# Patient Record
Sex: Female | Born: 1980 | Hispanic: No | Marital: Single | Smoking: Former smoker
Health system: Southern US, Community
[De-identification: ages and names within clinical notes are randomized; demographics above are authoritative.]

## PROBLEM LIST (undated history)

## (undated) ENCOUNTER — Inpatient Hospital Stay (HOSPITAL_COMMUNITY): Payer: Self-pay

## (undated) DIAGNOSIS — K6289 Other specified diseases of anus and rectum: Secondary | ICD-10-CM

## (undated) DIAGNOSIS — E785 Hyperlipidemia, unspecified: Secondary | ICD-10-CM

## (undated) DIAGNOSIS — O24419 Gestational diabetes mellitus in pregnancy, unspecified control: Secondary | ICD-10-CM

## (undated) DIAGNOSIS — R51 Headache: Secondary | ICD-10-CM

## (undated) DIAGNOSIS — R519 Headache, unspecified: Secondary | ICD-10-CM

## (undated) DIAGNOSIS — E282 Polycystic ovarian syndrome: Secondary | ICD-10-CM

## (undated) DIAGNOSIS — R059 Cough, unspecified: Secondary | ICD-10-CM

## (undated) DIAGNOSIS — R05 Cough: Secondary | ICD-10-CM

## (undated) DIAGNOSIS — I10 Essential (primary) hypertension: Secondary | ICD-10-CM

## (undated) DIAGNOSIS — I219 Acute myocardial infarction, unspecified: Secondary | ICD-10-CM

## (undated) DIAGNOSIS — F419 Anxiety disorder, unspecified: Secondary | ICD-10-CM

## (undated) DIAGNOSIS — G930 Cerebral cysts: Secondary | ICD-10-CM

## (undated) DIAGNOSIS — R0981 Nasal congestion: Secondary | ICD-10-CM

## (undated) HISTORY — DX: Other specified diseases of anus and rectum: K62.89

## (undated) HISTORY — DX: Essential (primary) hypertension: I10

## (undated) HISTORY — DX: Cough, unspecified: R05.9

## (undated) HISTORY — DX: Headache, unspecified: R51.9

## (undated) HISTORY — DX: Anxiety disorder, unspecified: F41.9

## (undated) HISTORY — DX: Headache: R51

## (undated) HISTORY — DX: Gestational diabetes mellitus in pregnancy, unspecified control: O24.419

## (undated) HISTORY — DX: Hyperlipidemia, unspecified: E78.5

## (undated) HISTORY — PX: HEMORRHOID SURGERY: SHX153

## (undated) HISTORY — DX: Cerebral cysts: G93.0

## (undated) HISTORY — DX: Cough: R05

## (undated) HISTORY — DX: Polycystic ovarian syndrome: E28.2

## (undated) HISTORY — DX: Nasal congestion: R09.81

---
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
## 2001-07-03 ENCOUNTER — Emergency Department (HOSPITAL_COMMUNITY): Admission: EM | Admit: 2001-07-03 | Discharge: 2001-07-04 | Payer: Self-pay | Admitting: *Deleted

## 2002-10-02 ENCOUNTER — Emergency Department (HOSPITAL_COMMUNITY): Admission: EM | Admit: 2002-10-02 | Discharge: 2002-10-02 | Payer: Self-pay | Admitting: *Deleted

## 2002-12-31 ENCOUNTER — Ambulatory Visit (HOSPITAL_COMMUNITY): Admission: RE | Admit: 2002-12-31 | Discharge: 2002-12-31 | Payer: Self-pay | Admitting: Obstetrics and Gynecology

## 2003-01-05 ENCOUNTER — Emergency Department (HOSPITAL_COMMUNITY): Admission: EM | Admit: 2003-01-05 | Discharge: 2003-01-05 | Payer: Self-pay | Admitting: Emergency Medicine

## 2003-02-10 ENCOUNTER — Ambulatory Visit (HOSPITAL_COMMUNITY): Admission: RE | Admit: 2003-02-10 | Discharge: 2003-02-10 | Payer: Self-pay | Admitting: Obstetrics and Gynecology

## 2003-02-17 ENCOUNTER — Ambulatory Visit (HOSPITAL_COMMUNITY): Admission: AD | Admit: 2003-02-17 | Discharge: 2003-02-17 | Payer: Self-pay | Admitting: Obstetrics and Gynecology

## 2003-03-02 ENCOUNTER — Inpatient Hospital Stay (HOSPITAL_COMMUNITY): Admission: RE | Admit: 2003-03-02 | Discharge: 2003-03-04 | Payer: Self-pay | Admitting: Obstetrics and Gynecology

## 2003-04-15 ENCOUNTER — Other Ambulatory Visit: Admission: RE | Admit: 2003-04-15 | Discharge: 2003-04-15 | Payer: Self-pay | Admitting: Obstetrics & Gynecology

## 2003-09-25 ENCOUNTER — Emergency Department (HOSPITAL_COMMUNITY): Admission: EM | Admit: 2003-09-25 | Discharge: 2003-09-25 | Payer: Self-pay | Admitting: Emergency Medicine

## 2003-10-08 ENCOUNTER — Emergency Department (HOSPITAL_COMMUNITY): Admission: EM | Admit: 2003-10-08 | Discharge: 2003-10-08 | Payer: Self-pay | Admitting: Emergency Medicine

## 2003-11-08 HISTORY — PX: FRACTURE SURGERY: SHX138

## 2004-05-07 HISTORY — PX: ANKLE SURGERY: SHX546

## 2005-03-02 ENCOUNTER — Emergency Department (HOSPITAL_COMMUNITY): Admission: EM | Admit: 2005-03-02 | Discharge: 2005-03-02 | Payer: Self-pay | Admitting: Emergency Medicine

## 2008-08-11 ENCOUNTER — Emergency Department (HOSPITAL_COMMUNITY): Admission: EM | Admit: 2008-08-11 | Discharge: 2008-08-11 | Payer: Self-pay | Admitting: Emergency Medicine

## 2008-09-02 ENCOUNTER — Other Ambulatory Visit: Admission: RE | Admit: 2008-09-02 | Discharge: 2008-09-02 | Payer: Self-pay | Admitting: Obstetrics and Gynecology

## 2009-03-27 ENCOUNTER — Inpatient Hospital Stay (HOSPITAL_COMMUNITY): Admission: AD | Admit: 2009-03-27 | Discharge: 2009-03-29 | Payer: Self-pay | Admitting: Obstetrics & Gynecology

## 2009-07-16 ENCOUNTER — Other Ambulatory Visit: Admission: RE | Admit: 2009-07-16 | Discharge: 2009-07-16 | Payer: Self-pay | Admitting: Obstetrics and Gynecology

## 2009-07-22 ENCOUNTER — Emergency Department (HOSPITAL_COMMUNITY): Admission: EM | Admit: 2009-07-22 | Discharge: 2009-07-22 | Payer: Self-pay | Admitting: Emergency Medicine

## 2010-09-07 HISTORY — PX: INCISION AND DRAINAGE BREAST ABSCESS: SUR672

## 2010-09-08 ENCOUNTER — Inpatient Hospital Stay (HOSPITAL_COMMUNITY): Admission: AD | Admit: 2010-09-08 | Discharge: 2010-09-08 | Payer: Self-pay | Admitting: Obstetrics & Gynecology

## 2010-09-15 ENCOUNTER — Emergency Department (HOSPITAL_BASED_OUTPATIENT_CLINIC_OR_DEPARTMENT_OTHER): Admission: EM | Admit: 2010-09-15 | Discharge: 2010-09-15 | Payer: Self-pay | Admitting: Emergency Medicine

## 2011-02-11 LAB — URINALYSIS, ROUTINE W REFLEX MICROSCOPIC
Hgb urine dipstick: NEGATIVE
Ketones, ur: NEGATIVE mg/dL
Specific Gravity, Urine: 1.02 (ref 1.005–1.030)
pH: 6.5 (ref 5.0–8.0)

## 2011-02-11 LAB — PREGNANCY, URINE: Preg Test, Ur: NEGATIVE

## 2011-02-15 LAB — CBC
MCHC: 33.3 g/dL (ref 30.0–36.0)
MCV: 83 fL (ref 78.0–100.0)
RDW: 14.5 % (ref 11.5–15.5)
WBC: 17.6 10*3/uL — ABNORMAL HIGH (ref 4.0–10.5)

## 2011-02-15 LAB — RPR: RPR Ser Ql: NONREACTIVE

## 2011-03-02 ENCOUNTER — Other Ambulatory Visit: Payer: Self-pay | Admitting: Adult Health

## 2011-03-02 ENCOUNTER — Other Ambulatory Visit (HOSPITAL_COMMUNITY)
Admission: RE | Admit: 2011-03-02 | Discharge: 2011-03-02 | Disposition: A | Discharge status: Home / Self Care | Payer: Medicaid Other | Source: Ambulatory Visit | Attending: Obstetrics and Gynecology | Admitting: Obstetrics and Gynecology

## 2011-03-02 DIAGNOSIS — Z01419 Encounter for gynecological examination (general) (routine) without abnormal findings: Secondary | ICD-10-CM | POA: Insufficient documentation

## 2011-03-25 NOTE — H&P (Signed)
NAME:  Kaitlin Torres, PARA                            ACCOUNT NO.:  [id number]   MEDICAL RECORD NO.:  [id number]                   PATIENT TYPE:  INP   LOCATION:  LDR1                                 FACILITY:  [facility]   PHYSICIAN:  Tilda Burrow, M.D.              DATE OF BIRTH:  [birth year]   DATE OF ADMISSION:  03/02/2003  DATE OF DISCHARGE:                                HISTORY & PHYSICAL   ADMISSION DIAGNOSES:  1. Pregnancy, 40 weeks, three days' gestation.  2. Cervical favor ability.  3. Medical induction of labor, elective.   HISTORY OF PRESENT ILLNESS:  This 30 year old female, gravida 2, para 1, AB  0, LMP May 22, 2002, placing menstruation EDC of February 27, 2003.  Ultrasound found EDC of April 29 and Mar 08, 2003, is admitted at 40 weeks,  three days by menstrual history, 39 weeks, three days by ultrasound  criteria.  Her pregnancy was followed through our office since August 09, 2002, with the patient admitted for labor induction.  Pregnancy course has  been followed through our office with blood type A positive, EDS negative,  rubella immune at the present, hepatitis, HIV, GC, Chlamydia, RPR all  negative.  Alpha theta protein within normal limits at 1 and 6 and  20.  Risk of Down's syndrome and group B Strep negative.  She has a history of an  abnormal Pap in the past without treatment and apparent spontaneous  resolution.  She is aware that all these risks of labor and management can  occur with induced labors, including the need for emergency intervention by  cesarean section or operative management balloonary.  Mechanical aspects of  labor induction have been reviewed with the patient to her satisfaction by  myself and Cec Neal, R. N.   PAST MEDICAL HISTORY:  Benign.   PAST SURGICAL HISTORY:  Negative.   ALLERGIES:  NO KNOWN DRUG ALLERGIES.   SOCIAL HISTORY:  1. Lives with mother and daughter.  2. Goes to [employer]  3. Is the best friend of Cec [place], [place]. N.   HABITS:  1. Cigarettes:  One half to one pack per day.  2. Alcohol and recreational drugs:  Denies at this time.  Denied in recent     years.   PHYSICAL EXAMINATION:  GENERAL:  Reveals a healthy-appearing female, alert,  oriented x3.  HEENT:  Pupils equal, round, and reactive.  Extraocular movements intact.  ABDOMEN:  Shows a term sized fetus, vertex presentation, estimated fetal  weight 7-1/2 pounds.  CERVIX:  Cervix 2 cm long, soft, mid position with balloon easily inserted.  EXTREMITIES:  Show 1+ edema.   IMPRESSION:  Pregnancy at term for induction of labor with cervical favor  ability.   PLAN:  Induction overnight.  Analgesic planned epidural.  She plans to  bottle feed and future contraception is planned to be the patch.  Tilda Burrow, M.D.    JVF/MEDQ  D:  03/02/2003  T:  03/02/2003  Job:  [id number]   cc:   Tilda Burrow, M.D.  [address] [place]  [place] [address]  Fax: [phone]   Francoise Schaumann. Halm, D.O.  [address], [address]  [place]  [place] [address]  Fax: [phone]

## 2011-03-25 NOTE — Op Note (Signed)
   NAME:  Kaitlin Torres, Kaitlin Torres                            ACCOUNT NO.:  [id number]   MEDICAL RECORD NO.:  [id number]                   PATIENT TYPE:  INP   LOCATION:  LDR1                                 FACILITY:  [facility]   PHYSICIAN:  Tilda Burrow, M.D.              DATE OF BIRTH:  12-15-1980   DATE OF PROCEDURE:  03/03/2003  DATE OF DISCHARGE:                                 OPERATIVE REPORT   PROCEDURE:  Lumbar epidural catheter placement, continuous lumbar epidural  catheter placed using loss of resistance technique at L3-4 interspace after  prepping and draping of the back.  Loss of resistance technique with an 19-  gauge Tuohy needle identified the epidural space at a distance of  approximately 5 cm depth.  5 cc of Xylocaine 1.5% with epinephrine was  administered.  No tachycardia noted and the epidural catheter inserted to a  depth of 3 cm.  The patient received analgesic effect.  The epidural  catheter was taped to the back and the patient given a 7 cc bolus of 0.125%  Marcaine solution (with 2 mcg per mL of Fentanyl).  The patient then had a  12 cc infusion performed with good analgesic levels and __________.                                               Tilda Burrow, M.D.    JVF/MEDQ  D:  03/03/2003  T:  03/03/2003  Job:  [id number]

## 2011-03-25 NOTE — Op Note (Signed)
   NAME:  ANAYLA, GIANNETTI                            ACCOUNT NO.:  [id number]   MEDICAL RECORD NO.:  [id number]                   PATIENT TYPE:  INP   LOCATION:  A428                                 FACILITY:  [facility]   PHYSICIAN:  Jacklyn Shell, C.N.M.    DATE OF BIRTH:  1981-04-06   DATE OF PROCEDURE:  DATE OF DISCHARGE:                                 OPERATIVE REPORT   DELIVERY NOTE:  Tyyonna was noted to be fully dilated with an irresistible urge  to push at approximately 1215.  After a brief second phase, she delivered a  viable female infant at 58.  Mouth and nose were suctioned on the perineum  and the baby delivered easily after that.  Weight is 6 pounds 12 ounces,  Apgars 9/9.  Twenty units of Pitocin diluted in 1000 ml of lactated Ringer's  was then infused rapidly IV.  The placenta separated spontaneously and  delivered via controlled cord traction and maternal pushing effort at 1231.  It was inspected and appears to be intact with a three-vessel cord.  The  vagina was then inspected and no lacerations were found.  The epidural  catheter was removed with the blue tip being visualized as intact.  Estimated blood loss 300 ml.                                               Jacklyn Shell, C.N.M.    FC/MEDQ  D:  03/03/2003  T:  03/03/2003  Job:  [id number]   cc:   [facility]

## 2011-08-04 ENCOUNTER — Emergency Department (HOSPITAL_COMMUNITY)
Admission: EM | Admit: 2011-08-04 | Discharge: 2011-08-04 | Disposition: A | Discharge status: Home / Self Care | Payer: Medicaid Other | Attending: Emergency Medicine | Admitting: Emergency Medicine

## 2011-08-04 ENCOUNTER — Emergency Department (HOSPITAL_COMMUNITY)
Admission: EM | Admit: 2011-08-04 | Discharge: 2011-08-04 | Disposition: A | Discharge status: Home / Self Care | Payer: Medicaid Other | Source: Home / Self Care | Attending: Emergency Medicine | Admitting: Emergency Medicine

## 2011-08-04 DIAGNOSIS — F411 Generalized anxiety disorder: Secondary | ICD-10-CM | POA: Insufficient documentation

## 2011-08-04 DIAGNOSIS — K6289 Other specified diseases of anus and rectum: Secondary | ICD-10-CM | POA: Insufficient documentation

## 2011-08-04 DIAGNOSIS — K644 Residual hemorrhoidal skin tags: Secondary | ICD-10-CM | POA: Insufficient documentation

## 2011-08-05 ENCOUNTER — Encounter (INDEPENDENT_AMBULATORY_CARE_PROVIDER_SITE_OTHER): Payer: Self-pay | Admitting: Surgery

## 2011-08-05 ENCOUNTER — Ambulatory Visit (INDEPENDENT_AMBULATORY_CARE_PROVIDER_SITE_OTHER): Payer: Medicaid Other | Admitting: Surgery

## 2011-08-05 VITALS — BP 136/70 | HR 100 | Temp 97.8°F | Resp 20 | Ht 68.5 in | Wt 220.0 lb

## 2011-08-05 DIAGNOSIS — K648 Other hemorrhoids: Secondary | ICD-10-CM

## 2011-08-05 NOTE — Progress Notes (Signed)
Chief Complaint  Patient presents with  . Rectal Problems    thrombosed external hems referral from ER - Ms. Paulene Floor, FNP    HISTORY: Patient is a 30 year old female referred from the emergency department and from her primary care physician with a hemorrhoids and pain. Patient was apparently seen in the emergency department on 2 occasions this week. She was given narcotics and topical analgesics. She was placed on stool softener. She was referred to surgery for evaluation.  Patient denies any previous problems with hemorrhoids. She has had no prior anorectal surgery.   Past Medical History  Diagnosis Date  . Nasal congestion   . Cough   . Rectal pain   . Generalized headaches   . Hyperlipidemia      Current Outpatient Prescriptions  Medication Sig Dispense Refill  . DIBUCAINE EX Take by mouth 3 (three) times daily.        . hydrocortisone (ANUSOL-HC) 25 MG suppository Place 25 mg rectally 2 (two) times daily.        Marland Kitchen oxyCODONE-acetaminophen (PERCOCET) 5-325 MG per tablet Take 1 tablet by mouth every 6 (six) hours as needed.        Marland Kitchen POLYETHYLENE GLYCOL 3350 PO Take 238 mg by mouth.           No Known Allergies   Family History  Problem Relation Age of Onset  . Heart disease Father     Cardiac arrest 2006     History   Social History  . Marital Status: Legally Separated    Spouse Name: N/A    Number of Children: N/A  . Years of Education: N/A   Social History Main Topics  . Smoking status: Current Everyday Smoker -- 0.7 packs/day  . Smokeless tobacco: Never Used  . Alcohol Use: Yes  . Drug Use: No  . Sexually Active:    Other Topics Concern  . None   Social History Narrative  . None     REVIEW OF SYSTEMS - PERTINENT POSITIVES ONLY: Patient notes pain and swelling at the anus. She denies bleeding.  She denies prior surgery.   EXAM: Filed Vitals:   08/05/11 1603  BP: 136/70  Pulse: 100  Temp: 97.8 F (36.6 C)  Resp: 20     HEENT: normocephalic; pupils equal and reactive; sclerae clear; dentition good; mucous membranes moist CHEST: clear to auscultation bilaterally without rales, rhonchi, or wheezes CARDIAC: regular rate and rhythm without significant murmur; peripheral pulses are full EXT:  non-tender without edema; no deformity ANORECTAL: Prolapsed internal hemorrhoids, nearly circumferential, no ulceration, no bleeding, no evidence of thrombosis. With lubrication hemorrhoids are completely reduced. No palpable masses. NEURO: no gross focal deficits; no sign of tremor   LABORATORY RESULTS: See E-Chart for most recent results   RADIOLOGY RESULTS: See E-Chart or I-Site for most recent results   IMPRESSION: Grade 4 internal hemorrhoids with prolapse   PLAN: Usual instructions for her hemorrhoids are provided to the patient both orally and in writing. She will continue with stool softeners. She will do to help soaks.  I have asked her to return in 3 weeks for evaluation by my partner who specializes in the [facility] procedure. He will evaluate her again at that time.  We will keep her out of work for the next 2 days to facilitate her recovery.   Velora Heckler, MD, FACS General & Endocrine Surgery [facility]      Visit Diagnoses: 1. Hemorrhoids, internal, with prolapse  Primary Care Physician: No primary provider on file.

## 2011-08-05 NOTE — Patient Instructions (Signed)
ANORECTAL PROCEDURES: 1.  Tub soaks 2-3 times daily in warm water (may add Epsom salts if desired) 2.  Stool softener for one month (store brand Miralax or Colace) 3.  Avoid toilet paper - use baby wipes or Tucks pads 4.  Increase water intake - 6-8 glasses daily 5.  Apply dry pad to area until drainage stops 

## 2011-08-08 LAB — URINALYSIS, ROUTINE W REFLEX MICROSCOPIC
Bilirubin Urine: NEGATIVE
Glucose, UA: NEGATIVE
Specific Gravity, Urine: 1.01
Urobilinogen, UA: 0.2

## 2011-09-05 ENCOUNTER — Encounter (INDEPENDENT_AMBULATORY_CARE_PROVIDER_SITE_OTHER): Payer: Medicaid Other | Admitting: Surgery

## 2011-09-14 ENCOUNTER — Encounter (INDEPENDENT_AMBULATORY_CARE_PROVIDER_SITE_OTHER): Payer: Self-pay | Admitting: Surgery

## 2011-10-11 ENCOUNTER — Encounter (INDEPENDENT_AMBULATORY_CARE_PROVIDER_SITE_OTHER): Payer: Medicaid Other | Admitting: Surgery

## 2011-10-21 ENCOUNTER — Encounter (INDEPENDENT_AMBULATORY_CARE_PROVIDER_SITE_OTHER): Payer: Self-pay | Admitting: Surgery

## 2013-11-07 HISTORY — PX: HEMORRHOID SURGERY: SHX153

## 2017-11-14 ENCOUNTER — Other Ambulatory Visit: Payer: Self-pay

## 2017-11-14 ENCOUNTER — Encounter (INDEPENDENT_AMBULATORY_CARE_PROVIDER_SITE_OTHER): Payer: Self-pay

## 2017-11-14 ENCOUNTER — Ambulatory Visit (INDEPENDENT_AMBULATORY_CARE_PROVIDER_SITE_OTHER): Payer: Medicaid Other | Admitting: Women's Health

## 2017-11-14 ENCOUNTER — Encounter: Payer: Self-pay | Admitting: Women's Health

## 2017-11-14 VITALS — BP 138/88 | HR 82 | Ht 69.0 in | Wt 211.0 lb

## 2017-11-14 DIAGNOSIS — N926 Irregular menstruation, unspecified: Secondary | ICD-10-CM | POA: Diagnosis not present

## 2017-11-14 DIAGNOSIS — Z319 Encounter for procreative management, unspecified: Secondary | ICD-10-CM

## 2017-11-14 DIAGNOSIS — Z3202 Encounter for pregnancy test, result negative: Secondary | ICD-10-CM | POA: Diagnosis not present

## 2017-11-14 DIAGNOSIS — F17210 Nicotine dependence, cigarettes, uncomplicated: Secondary | ICD-10-CM

## 2017-11-14 LAB — POCT URINE PREGNANCY: Preg Test, Ur: NEGATIVE

## 2017-11-14 NOTE — Progress Notes (Signed)
   GYN VISIT Patient name: Kaitlin Torres MRN [id number]  Date of birth: [birth year] Chief Complaint:   period was 3 weeks late  History of Present Illness:   Kaitlin Torres is a 37 y.o. [id number] African American female being seen today for report of period being 3 weeks later, however it did start on 12/27. She and her 21yo boyfriend have been trying to conceive x 6 months. He has never fathered any children. She has conceived 3 times previously without difficulty. Has not been on birth control in over 8 years. This is the 2nd time her period was late. Does have cramping when she has periods. Just moved back from [place] in Nov. Taking pnv. Does smoke 1/2-1ppd. Has cut down from 2ppd.  She wants boyfriend 'checked out'    Patient's last menstrual period was 11/02/2017. The current method of family planning is none. Last pap 03/02/11- here. Reports she did have pap in [place] within last 55yrs Results were:  normal Review of Systems:   Pertinent items are noted in HPI Denies fever/chills, dizziness, headaches, visual disturbances, fatigue, shortness of breath, chest pain, abdominal pain, vomiting, abnormal vaginal discharge/itching/odor/irritation, problems with periods, bowel movements, urination, or intercourse unless otherwise stated above.  Pertinent History Reviewed:  Reviewed past medical,surgical, social, obstetrical and family history.  Reviewed problem list, medications and allergies. Physical Assessment:   Vitals:   11/14/17 0855  BP: 138/88  Pulse: 82  Weight: 211 lb (95.7 kg)  Height: 5\' 9"  (1.753 m)  Body mass index is 31.16 kg/m.       Physical Examination:   General appearance: alert, well appearing, and in no distress  Mental status: alert, oriented to person, place, and time  Skin: warm & dry   Cardiovascular: normal heart rate noted  Respiratory: normal respiratory effort, no distress  Abdomen: soft, non-tender   Pelvic: examination not indicated  Extremities: no edema   Results  for orders placed or performed in visit on 11/14/17 (from the past 24 hour(s))  POCT urine pregnancy   Collection Time: 11/14/17  9:07 AM  Result Value Ref Range   Preg Test, Ur Negative Negative    Assessment & Plan:  1) Irregular/late periods  2) Trying to conceive> continue pnv daily, will check progesterone on day 21 (1/17) to see if ovulating. If not, discussed coc's x 3 months, then coming off. Gave printed fertility tips/tricks. Gave number to [facility] for boyfriend to call/schedule semen analysis  3) Smoker> advised cessation.   Meds: No orders of the defined types were placed in this encounter.   Orders Placed This Encounter  Procedures  . Progesterone  . POCT urine pregnancy    Return for 1/17 for labs; pap results from [facility] in [place] (phone [phone]).  Tawnya Crook CNM, [facility] 11/14/2017 9:45 AM

## 2017-11-14 NOTE — Patient Instructions (Addendum)
Come back Jan 17 for labs to check to see if you are ovulating  [facility]>[phone], semen analysis  If you are trying to get pregnant:   Have sex every other day on days 7-24 of your cycle (Day 1 is the 1st day of your period)  [place] before sex  Lay with your hips elevated on pillows for 20-48mins after sex  Do not smoke or drink alcohol  Lose weight if you are overweight  Take a prenatal vitamin with at least 563SLH of folic acid  Decrease stress in your life  For Him:   Wear boxers instead of briefs  Avoid hot baths/jacuzzi  Vit C supplement  Do not smoke or drink alcohol  Lose weight if you are overweight  Keep in mind that it can be normal to take up to a year to become pregnant 80-90% of women will become pregnant within 1 year of trying

## 2017-11-15 ENCOUNTER — Ambulatory Visit (INDEPENDENT_AMBULATORY_CARE_PROVIDER_SITE_OTHER): Payer: Medicaid Other | Admitting: Family Medicine

## 2017-11-15 ENCOUNTER — Encounter: Payer: Self-pay | Admitting: Family Medicine

## 2017-11-15 ENCOUNTER — Encounter: Payer: Self-pay | Admitting: Women's Health

## 2017-11-15 VITALS — BP 142/98 | HR 85 | Temp 97.1°F | Ht 69.0 in | Wt 211.0 lb

## 2017-11-15 DIAGNOSIS — Z13220 Encounter for screening for lipoid disorders: Secondary | ICD-10-CM

## 2017-11-15 DIAGNOSIS — Z124 Encounter for screening for malignant neoplasm of cervix: Secondary | ICD-10-CM | POA: Insufficient documentation

## 2017-11-15 DIAGNOSIS — Z202 Contact with and (suspected) exposure to infections with a predominantly sexual mode of transmission: Secondary | ICD-10-CM | POA: Diagnosis not present

## 2017-11-15 DIAGNOSIS — R030 Elevated blood-pressure reading, without diagnosis of hypertension: Secondary | ICD-10-CM

## 2017-11-15 DIAGNOSIS — G930 Cerebral cysts: Secondary | ICD-10-CM

## 2017-11-15 DIAGNOSIS — G43009 Migraine without aura, not intractable, without status migrainosus: Secondary | ICD-10-CM

## 2017-11-15 DIAGNOSIS — Z131 Encounter for screening for diabetes mellitus: Secondary | ICD-10-CM

## 2017-11-15 LAB — BAYER DCA HB A1C WAIVED: HB A1C: 5.4 % (ref <7.0)

## 2017-11-15 MED ORDER — BUTALBITAL-APAP-CAFFEINE 50-325-40 MG PO TABS: 1.0000 | ORAL_TABLET | Freq: Four times a day (QID) | ORAL | 0 refills | Status: DC | PRN

## 2017-11-15 NOTE — Progress Notes (Signed)
BP (!) 149/101   Pulse 85   Temp (!) 97.1 F (36.2 C) (Oral)   Ht _0  (1.753 m)   Wt 211 lb (95.7 kg)   LMP 11/02/2017   BMI 31.16 kg/m    Subjective:    Patient ID: Kaitlin Torres, female    DOB: May 01, 1981, 37 y.o.   MRN: [id number]  HPI: Kaitlin Torres is a 37 y.o. 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  She has complaints of migraine headaches that she gets recurrently.  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  She gets these recurrent migraine headaches she describes her headaches as bilateral temporal headaches.  She says that the headaches are a 6 out of 10 currently today.  She says that they normally use Fioricet.  She denies getting any aura with it.  Elevated blood pressure reading Patient has had elevated blood pressure readings, she says is a lot when she gets her migraines.  She has had some previously normal blood pressure readings which is gone back and forth.  She denies any chest pain or vision troubles or shortness of breath or wheezing.  Relevant past medical, surgical, family and social history reviewed and updated as indicated. Interim medical history since our last visit reviewed. Allergies and medications reviewed and updated.  Review of Systems  Constitutional: Negative for chills and fever.  Eyes: Negative for visual disturbance.  Respiratory: Negative for chest tightness and shortness of breath.   Cardiovascular: Negative for chest pain and leg swelling.  Musculoskeletal: Negative for back pain and gait problem.  Skin: Negative for rash.  Neurological: Positive for headaches. Negative  for dizziness, weakness, light-headedness and numbness.  Psychiatric/Behavioral: Negative for agitation and behavioral problems.  All other systems reviewed and are negative.   Per HPI unless specifically indicated above  Social History   Socioeconomic History  . Marital status: Married    Spouse name: Not on file  . Number of children: Not on file  . Years of education: Not on file  . Highest education level: Not on file  Social Needs  . Financial resource strain: Not on file  . Food insecurity - worry: Not on file  . Food insecurity - inability: Not on file  . Transportation needs - medical: Not on file  . Transportation needs - non-medical: Not on file  Occupational History  . Not on file  Tobacco Use  . Smoking status: Current Every Day Smoker    Packs/day: 1.00    Years: 26.00    Pack years: 26.00    Types: Cigarettes  . Smokeless tobacco: Never Used  Substance and Sexual Activity  . Alcohol use: Yes    Frequency: Never    Comment: rarely  . Drug use: No  . Sexual activity: Yes    Birth control/protection: None    Comment: married, 3 kids, vaginal, trying for another  Other Topics Concern  . Not on file  Social History Narrative  . Not on file    Past Surgical History:  Procedure Laterality Date  . ANKLE SURGERY  july 2005   screws placed in right ankle   . FRACTURE SURGERY  2005   right ankle  . HEMORRHOID SURGERY    . HEMORRHOID SURGERY  2015  . INCISION AND DRAINAGE BREAST ABSCESS  november 2011   left breast     Family History  Problem Relation Age of Onset  . Hypertension Mother   . Heart disease Father        Cardiac arrest 2006  . Diabetes Father   . Hyperlipidemia Father   . Stroke Father   . Diabetes Paternal Grandmother   . Cancer Maternal Grandmother        cervical  . Aneurysm Brother     Allergies as of 11/15/2017   No Known Allergies     Medication List        Accurate as of 11/15/17 10:42 AM. Always use your most recent med  list.          ALEVE 220 MG Caps Generic drug:  Naproxen Sodium Take by mouth as needed.   butalbital-acetaminophen-caffeine 50-325-40 MG tablet Commonly known as:  FIORICET, ESGIC Take 1 tablet by mouth every 6 (six) hours as needed for headache.   PRENATAL VITAMIN PO Take 1 tablet by mouth daily.          Objective:    BP (!) 149/101   Pulse 85   Temp (!) 97.1 F (36.2 C) (Oral)   Ht _0  (1.753 m)   Wt 211 lb (95.7 kg)   LMP 11/02/2017   BMI 31.16 kg/m   Wt Readings from Last 3 Encounters:  11/15/17 211 lb (95.7 kg)  11/14/17 211 lb (95.7 kg)  08/05/11 220 lb (99.8 kg)    Physical Exam  Constitutional: She is oriented to person, place, and time. She appears well-developed and well-nourished. No distress.  HENT:  Right Ear: External ear normal.  Left Ear: External ear normal.  Nose: Nose normal.  Mouth/Throat: Oropharynx is clear and moist. No oropharyngeal exudate.  Eyes: Conjunctivae are normal.  Neck: Neck supple. No thyromegaly present.  Cardiovascular: Normal rate, regular rhythm, normal heart sounds and intact distal pulses.  No murmur heard. Pulmonary/Chest: Effort normal and breath sounds normal. No respiratory distress. She has no wheezes.  Musculoskeletal: Normal range of motion. She exhibits no edema or tenderness.  Lymphadenopathy:    She has no cervical adenopathy.  Neurological: She is alert and oriented to person, place, and time. She displays normal reflexes. No cranial nerve deficit. She exhibits normal muscle tone. Coordination normal.  Skin: Skin is warm and dry. No rash noted. She is not diaphoretic.  Psychiatric: She has a normal mood and affect. Her behavior is normal.  Nursing note and vitals reviewed.       Assessment & Plan:   Problem List Items Addressed This Visit    None    Visit Diagnoses    Migraine without aura and without status migrainosus, not intractable    -  Primary   Relevant Medications   Naproxen Sodium  (ALEVE) 220 MG CAPS   butalbital-acetaminophen-caffeine (FIORICET, ESGIC) 50-325-40 MG tablet   Other Relevant Orders   Ambulatory referral to Neurology   Elevated blood pressure reading       Likely from migraines, will monitor in future   Relevant Orders   CMP14+EGFR   CBC with Differential/Platelet   Cyst of brain       will request scans from [place] [facility]   Relevant Orders  Ambulatory referral to Neurology   Diabetes mellitus screening       Relevant Orders   CMP14+EGFR   Bayer DCA Hb A1c Waived   Lipid screening       Relevant Orders   Lipase   STD exposure       Relevant Orders   STD Screen (8)   Hepatitis C antibody       Follow up plan: Return in about 4 weeks (around 12/13/2017), or if symptoms worsen or fail to improve, for Hypertension and anxiety.  Caryl Pina, MD [facility] 11/15/2017, 10:42 AM

## 2017-11-16 LAB — CMP14+EGFR
A/G RATIO: 1.6 (ref 1.2–2.2)
ALT: 16 IU/L (ref 0–32)
AST: 18 IU/L (ref 0–40)
Albumin: 4.4 g/dL (ref 3.5–5.5)
Alkaline Phosphatase: 79 IU/L (ref 39–117)
BILIRUBIN TOTAL: 0.2 mg/dL (ref 0.0–1.2)
BUN / CREAT RATIO: 12 (ref 9–23)
BUN: 8 mg/dL (ref 6–20)
CHLORIDE: 105 mmol/L (ref 96–106)
CO2: 24 mmol/L (ref 20–29)
Calcium: 9.9 mg/dL (ref 8.7–10.2)
Creatinine, Ser: 0.69 mg/dL (ref 0.57–1.00)
GFR calc non Af Amer: 113 mL/min/{1.73_m2} (ref >59)
GFR, EST AFRICAN AMERICAN: 130 mL/min/{1.73_m2} (ref >59)
Globulin, Total: 2.7 g/dL (ref 1.5–4.5)
Glucose: 93 mg/dL (ref 65–99)
Potassium: 4.5 mmol/L (ref 3.5–5.2)
Sodium: 142 mmol/L (ref 134–144)
TOTAL PROTEIN: 7.1 g/dL (ref 6.0–8.5)

## 2017-11-16 LAB — CBC WITH DIFFERENTIAL/PLATELET
BASOS ABS: 0.1 10*3/uL (ref 0.0–0.2)
Basos: 1 %
EOS (ABSOLUTE): 0.1 10*3/uL (ref 0.0–0.4)
Eos: 1 %
Hematocrit: 42.5 % (ref 34.0–46.6)
Hemoglobin: 13.9 g/dL (ref 11.1–15.9)
Immature Grans (Abs): 0 10*3/uL (ref 0.0–0.1)
Immature Granulocytes: 0 %
LYMPHS ABS: 2.6 10*3/uL (ref 0.7–3.1)
Lymphs: 26 %
MCH: 25 pg — AB (ref 26.6–33.0)
MCHC: 32.7 g/dL (ref 31.5–35.7)
MCV: 76 fL — ABNORMAL LOW (ref 79–97)
MONOS ABS: 0.5 10*3/uL (ref 0.1–0.9)
Monocytes: 5 %
NEUTROS ABS: 6.7 10*3/uL (ref 1.4–7.0)
Neutrophils: 67 %
Platelets: 355 10*3/uL (ref 150–379)
RBC: 5.57 x10E6/uL — ABNORMAL HIGH (ref 3.77–5.28)
RDW: 15.9 % — ABNORMAL HIGH (ref 12.3–15.4)
WBC: 10 10*3/uL (ref 3.4–10.8)

## 2017-11-16 LAB — STD SCREEN (8)
HEP A IGM: NEGATIVE
HIV Screen 4th Generation wRfx: NONREACTIVE
HSV 1 Glycoprotein G Ab, IgG: 54 index — ABNORMAL HIGH (ref 0.00–0.90)
Hep B C IgM: NEGATIVE
Hep C Virus Ab: <0.1 s/co ratio (ref 0.0–0.9)
Hepatitis B Surface Ag: NEGATIVE
RPR: NONREACTIVE

## 2017-11-16 LAB — LIPASE: Lipase: 27 U/L (ref 14–72)

## 2017-11-17 ENCOUNTER — Telehealth: Payer: Self-pay | Admitting: Family Medicine

## 2017-11-17 ENCOUNTER — Telehealth: Payer: Self-pay | Admitting: Obstetrics & Gynecology

## 2017-11-17 NOTE — Telephone Encounter (Signed)
[facility] returning call. Advised she could try Monistat or generic over the counter to clear up.

## 2017-11-17 NOTE — Telephone Encounter (Signed)
I have sent results to the pools just came back today.

## 2017-11-17 NOTE — Telephone Encounter (Signed)
Please review results and send back to  [facility]. Thanks

## 2017-11-20 MED ORDER — FLUCONAZOLE 150 MG PO TABS: 150.0000 mg | ORAL_TABLET | Freq: Once | ORAL | 0 refills | Status: AC

## 2017-11-23 ENCOUNTER — Other Ambulatory Visit: Payer: Medicaid Other

## 2017-11-24 LAB — PROGESTERONE: Progesterone: <0.1 ng/mL

## 2017-11-28 ENCOUNTER — Telehealth: Payer: Self-pay | Admitting: Obstetrics & Gynecology

## 2017-12-12 ENCOUNTER — Ambulatory Visit: Payer: Medicaid Other | Admitting: Family Medicine

## 2017-12-13 ENCOUNTER — Encounter: Payer: Self-pay | Admitting: Family Medicine

## 2017-12-18 ENCOUNTER — Ambulatory Visit: Payer: Self-pay | Admitting: Family Medicine

## 2017-12-19 ENCOUNTER — Encounter: Payer: Self-pay | Admitting: Family Medicine

## 2017-12-19 ENCOUNTER — Other Ambulatory Visit: Payer: Self-pay | Admitting: Family Medicine

## 2017-12-19 ENCOUNTER — Ambulatory Visit (INDEPENDENT_AMBULATORY_CARE_PROVIDER_SITE_OTHER): Payer: Medicaid Other | Admitting: Family Medicine

## 2017-12-19 VITALS — BP 138/88 | HR 80 | Temp 97.6°F | Ht 69.0 in | Wt 213.0 lb

## 2017-12-19 DIAGNOSIS — J069 Acute upper respiratory infection, unspecified: Secondary | ICD-10-CM | POA: Diagnosis not present

## 2017-12-19 NOTE — Progress Notes (Signed)
Chief Complaint  Patient presents with  . Headache    pt here today c/o headache and needs a doctors note for missing work     HPI  Patient presents today for patient presents with dry cough runny stuffy nose. Diffuse headache of moderate intensity. Patient also had  chills and subjective fever at onset. Body aches worst in the back.  Onset 5 days ago.  Now all of the symptoms have resolved except for mild headache.  She has chronic headaches this is similar to her usual.   PMH: Smoking status noted ROS: Per HPI  Objective: BP 138/88   Pulse 80   Temp 97.6 F (36.4 C) (Oral)   Ht 5\' 9"  (1.753 m)   Wt 213 lb (96.6 kg)   BMI 31.45 kg/m  Gen: NAD, alert, cooperative with exam HEENT: NCAT, EOMI, PERRL CV: RRR, good S1/S2, no murmur Resp: CTABL, no wheezes, non-labored Abd: SNTND, BS present, no guarding or organomegaly Ext: No edema, warm Neuro: Alert and oriented, No gross deficits  Assessment and plan:  1. Viral URI     Supportive treatment including increasing fluids ibuprofen as needed.  Resume normal activities as symptoms resolved.  Work note given.   Follow up as needed.  Claretta Fraise, MD

## 2018-01-06 DIAGNOSIS — R6889 Other general symptoms and signs: Secondary | ICD-10-CM | POA: Diagnosis not present

## 2018-01-06 DIAGNOSIS — J40 Bronchitis, not specified as acute or chronic: Secondary | ICD-10-CM | POA: Diagnosis not present

## 2018-01-16 ENCOUNTER — Telehealth: Payer: Self-pay | Admitting: Neurology

## 2018-01-16 ENCOUNTER — Ambulatory Visit: Payer: Medicaid Other | Admitting: Neurology

## 2018-01-16 ENCOUNTER — Encounter: Payer: Self-pay | Admitting: Neurology

## 2018-01-16 VITALS — BP 134/83 | HR 79 | Ht 69.0 in | Wt 216.0 lb

## 2018-01-16 DIAGNOSIS — R930 Abnormal findings on diagnostic imaging of skull and head, not elsewhere classified: Secondary | ICD-10-CM

## 2018-01-16 DIAGNOSIS — R51 Headache: Secondary | ICD-10-CM

## 2018-01-16 DIAGNOSIS — R519 Headache, unspecified: Secondary | ICD-10-CM

## 2018-01-16 NOTE — Progress Notes (Signed)
PATIENT: Kaitlin Torres DOB: 1981-07-28  Chief Complaint  Patient presents with  . Migraine    She is here with her husband, Kaitlin Torres. Reports 2-3 migraines monthly. She typically has nausea and light sensitivity with her headaches.  After laying down to rest in Torres dark room and taking Fioricet, her pain typically resolves within 30 minutes.  She has never been on Torres preventive medication. 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  She had her MRI when she lived in [place], [place].  Marland Kitchen PCP    Torres, Kaitlin Kaufmann, MD     [name] is Torres 37 year old female, seen in refer by her primary care doctor Torres, Kaitlin Torres for evaluation of migraine headaches, initial evaluation was on January 16, 2018, is accompanied by her husband.  She has past medical history of hyperlipidemia, migraine headache, anxiety, she recently moved from [place] to [place] in November 2018.  She was seen by neurologist at [facility] in the past for migraine headaches, also reported abnormal CT head which showed cyst lower brainstem and upper cervical region.  The CAT scan was done for prolonged headaches.  She reported Torres history of migraine headaches since young, her typical migraine are lateralized severe pounding headache with associated light noise sensitivity, movement made her headache worse, headache twice in 1 month, responding well to Fioricet, it take away her headache in half hour.  Laboratory evaluation January 2019 showed normal CMP, creatinine 0.69, CBC showed hemoglobin of 13.9, A1c was 5.4,  REVIEW OF SYSTEMS: Full 14 system review of systems performed and notable only for fatigue, blurred vision, increased thirst, allergy, frequent infection, confusion, headaches, weakness, dizziness, anxiety, decreased energy, change in appetite  ALLERGIES: No Known Allergies  HOME MEDICATIONS: Current Outpatient Medications  Medication  Sig Dispense Refill  . butalbital-acetaminophen-caffeine (FIORICET, ESGIC) 50-325-40 MG tablet Take 1 tablet by mouth every 6 (six) hours as needed for headache. 10 tablet 0  . Naproxen Sodium (ALEVE) 220 MG CAPS Take by mouth as needed.     No current facility-administered medications for this visit.     PAST MEDICAL HISTORY: Past Medical History:  Diagnosis Date  . Anxiety   . Cough   . Cyst of brain   . Generalized headaches   . Hyperlipidemia   . Hypertension   . Nasal congestion   . PCOS (polycystic ovarian syndrome)   . Rectal pain     PAST SURGICAL HISTORY: Past Surgical History:  Procedure Laterality Date  . ANKLE SURGERY  july 2005   screws placed in right ankle   . FRACTURE SURGERY  2005   right ankle  . HEMORRHOID SURGERY    . HEMORRHOID SURGERY  2015  . INCISION AND DRAINAGE BREAST ABSCESS  november 2011   left breast     FAMILY HISTORY: Family History  Problem Relation Age of Onset  . Hypertension Mother   . Heart disease Father        Cardiac arrest 2006  . Diabetes Father   . Hyperlipidemia Father   . Stroke Father   . Diabetes Paternal Grandmother   . Cancer Maternal Grandmother        cervical  . Aneurysm Brother     SOCIAL HISTORY:  Social History   Socioeconomic History  . Marital status: Married    Spouse name: Not on file  . Number of children: 3  . Years of education:  14  . Highest education level: Associate degree: academic program  Social Needs  . Financial resource strain: Not on file  . Food insecurity - worry: Not on file  . Food insecurity - inability: Not on file  . Transportation needs - medical: Not on file  . Transportation needs - non-medical: Not on file  Occupational History  . Occupation: Waitress  Tobacco Use  . Smoking status: Current Every Day Smoker    Packs/day: 1.00    Years: 26.00    Pack years: 26.00    Types: Cigarettes  . Smokeless tobacco: Never Used  Substance and Sexual Activity  . Alcohol use:  No    Frequency: Never    Comment: quit 2018  . Drug use: No  . Sexual activity: Yes    Birth control/protection: None    Comment: married, 3 kids, vaginal, trying for another  Other Topics Concern  . Not on file  Social History Narrative   Lives at home with her mother, husband and three daughters.   Right-handed.   Approximately 20 cups caffeine per day (4-5 glasses holding 32oz of tea).     PHYSICAL EXAM   Vitals:   01/16/18 1350  Weight: 216 lb (98 kg)  Height: 5\' 9"  (1.753 m)    Not recorded      Body mass index is 31.9 kg/m.  PHYSICAL EXAMNIATION:  Gen: NAD, conversant, well nourised, obese, well groomed                     Cardiovascular: Regular rate rhythm, no peripheral edema, warm, nontender. Eyes: Conjunctivae clear without exudates or hemorrhage Neck: Supple, no carotid bruits. Pulmonary: Clear to auscultation bilaterally   NEUROLOGICAL EXAM:  MENTAL STATUS: Speech:    Speech is normal; fluent and spontaneous with normal comprehension.  Cognition:     Orientation to time, place and person     Normal recent and remote memory     Normal Attention span and concentration     Normal Language, naming, repeating,spontaneous speech     Fund of knowledge   CRANIAL NERVES: CN II: Visual fields are full to confrontation. Fundoscopic exam is normal with sharp discs and no vascular changes. Pupils are round equal and briskly reactive to light. CN III, IV, VI: extraocular movement are normal. No ptosis. CN V: Facial sensation is intact to pinprick in all 3 divisions bilaterally. Corneal responses are intact.  CN VII: Face is symmetric with normal eye closure and smile. CN VIII: Hearing is normal to rubbing fingers CN IX, X: Palate elevates symmetrically. Phonation is normal. CN XI: Head turning and shoulder shrug are intact CN XII: Tongue is midline with normal movements and no atrophy.  MOTOR: There is no pronator drift of out-stretched arms. Muscle bulk  and tone are normal. Muscle strength is normal.  REFLEXES: Reflexes are 2+ and symmetric at the biceps, triceps, knees, and ankles. Plantar responses are flexor.  SENSORY: Intact to light touch, pinprick, positional sensation and vibratory sensation are intact in fingers and toes.  COORDINATION: Rapid alternating movements and fine finger movements are intact. There is no dysmetria on finger-to-nose and heel-knee-shin.    GAIT/STANCE: Posture is normal. Gait is steady with normal steps, base, arm swing, and turning. Heel and toe walking are normal. Tandem gait is normal.  Romberg is absent.   DIAGNOSTIC DATA (LABS, IMAGING, TESTING) - I reviewed patient records, labs, notes, testing and imaging myself where available.   ASSESSMENT AND PLAN  IVIONA HOLE is Torres 37 y.o. female   Chronic migraine headaches Abnormal CT head  Repeat CT head with and without contrast  Fioricet as needed for migraine   Marcial Pacas, M.D. Ph.D.  [facility] [address] [place], [place] [address] Ph: [phone] Fax: [phone]  CC: Torres, Kaitlin Kaufmann, MD

## 2018-01-16 NOTE — Telephone Encounter (Signed)
01/16/18 medicaid order sent to [facility]

## 2018-03-06 ENCOUNTER — Encounter: Payer: Self-pay | Admitting: Family Medicine

## 2018-03-06 ENCOUNTER — Ambulatory Visit (INDEPENDENT_AMBULATORY_CARE_PROVIDER_SITE_OTHER): Payer: Medicaid Other | Admitting: Family Medicine

## 2018-03-06 VITALS — BP 133/84 | HR 66 | Temp 97.0°F | Ht 69.0 in | Wt 216.0 lb

## 2018-03-06 DIAGNOSIS — G43009 Migraine without aura, not intractable, without status migrainosus: Secondary | ICD-10-CM | POA: Diagnosis not present

## 2018-03-06 DIAGNOSIS — Z23 Encounter for immunization: Secondary | ICD-10-CM | POA: Diagnosis not present

## 2018-03-06 DIAGNOSIS — N979 Female infertility, unspecified: Secondary | ICD-10-CM

## 2018-03-06 MED ORDER — BUTALBITAL-APAP-CAFFEINE 50-325-40 MG PO TABS: 1.0000 | ORAL_TABLET | Freq: Four times a day (QID) | ORAL | 0 refills | Status: AC | PRN

## 2018-03-06 NOTE — Progress Notes (Signed)
BP 133/84   Pulse 66   Temp (!) 97 F (36.1 C) (Oral)   Ht 5\' 9"  (1.753 m)   Wt 216 lb (98 kg)   BMI 31.90 kg/m    Subjective:    Patient ID: Kaitlin Torres, female    DOB: [birth year], 37 y.o.   MRN: [id number]  HPI: Kaitlin Torres is a 37 y.o. female presenting on 03/06/2018 for Discuss fertility (has been trying for over a year to get pregnant) and Refill Fioricet (visual changes  yesterday, describes as if she were looking through a kalidescope)   HPI Infertility and wanting to get pregnant Patient has been having infertility issues and wants to discuss trying to get pregnant.  She says she has been trying to get pregnant for over a year.  Patient is a G3, P3 and has never had any issues with getting pregnant before.  Her most recent pregnancy was 8 years ago.  She is with a different partner than she had her previous children with.  He has never had any children before.  She has been using an app on her phone that follows her cycles and tells her what days are active, this phone app has her been more sexually active between days 16 and 24 of her cycle, I recommended for her to be more active between days 60 and 18.  She says her current cycles have been slightly longer than a month and have been lasting about 34 to 35 days.  She says that they are sexually active every day during the times when out has told her.  She says she did try the ovulatory sticks but did not have much success with them as they did not change color for her.  She says her menstrual cycles have been coming every 34 days about and she has not missed any.  She says her cycles are lighter and only last 5 days compared to what they used to do.  She says her and her partner have been actively trying to have a child for over a year.  Migraine medication refill She very infrequently has had to use her Fioricet for migraines.  She says she did start to feel an aura like she has had before coming on yesterday but she did not have  a headache.  She described as seeing a kaleidoscope but then had noted no headache with it.  She denies any headache today either.  Relevant past medical, surgical, family and social history reviewed and updated as indicated. Interim medical history since our last visit reviewed. Allergies and medications reviewed and updated.  Review of Systems  Constitutional: Negative for chills and fever.  Eyes: Negative for visual disturbance.  Respiratory: Negative for chest tightness and shortness of breath.   Cardiovascular: Negative for chest pain and leg swelling.  Genitourinary: Positive for menstrual problem. Negative for difficulty urinating, dysuria, vaginal bleeding, vaginal discharge and vaginal pain.  Musculoskeletal: Negative for back pain and gait problem.  Skin: Negative for rash.  Neurological: Positive for headaches. Negative for light-headedness.  Psychiatric/Behavioral: Negative for agitation and behavioral problems.  All other systems reviewed and are negative.   Per HPI unless specifically indicated above   Allergies as of 03/06/2018   No Known Allergies     Medication List        Accurate as of 03/06/18  9:55 AM. Always use your most recent med list.          ALEVE 220 MG  Caps Generic drug:  Naproxen Sodium Take by mouth as needed.   butalbital-acetaminophen-caffeine 50-325-40 MG tablet Commonly known as:  FIORICET, ESGIC Take 1 tablet by mouth every 6 (six) hours as needed for headache.          Objective:    BP 133/84   Pulse 66   Temp (!) 97 F (36.1 C) (Oral)   Ht 5\' 9"  (1.753 m)   Wt 216 lb (98 kg)   BMI 31.90 kg/m   Wt Readings from Last 3 Encounters:  03/06/18 216 lb (98 kg)  01/16/18 216 lb (98 kg)  12/19/17 213 lb (96.6 kg)    Physical Exam  Constitutional: She is oriented to person, place, and time. She appears well-developed and well-nourished. No distress.  Eyes: Conjunctivae are normal.  Cardiovascular: Normal rate, regular rhythm,  normal heart sounds and intact distal pulses.  No murmur heard. Pulmonary/Chest: Effort normal and breath sounds normal. No respiratory distress. She has no wheezes.  Abdominal: Soft. Bowel sounds are normal. She exhibits no distension and no mass. There is no tenderness. There is no guarding.  Musculoskeletal: Normal range of motion. She exhibits no tenderness (No palpable tenderness in the head or neck).  Neurological: She is alert and oriented to person, place, and time. Coordination normal.  Skin: Skin is warm and dry. No rash noted. She is not diaphoretic.  Psychiatric: She has a normal mood and affect. Her behavior is normal.  Nursing note and vitals reviewed.     Assessment & Plan:   Problem List Items Addressed This Visit    None    Visit Diagnoses    Infertility, female    -  Primary   Relevant Orders   Alb+Prl+FSH+LH+Prog+DHEA+Es.Marland Kitchen. (Completed)   Migraine without aura and without status migrainosus, not intractable       Relevant Medications   butalbital-acetaminophen-caffeine (FIORICET, ESGIC) 50-325-40 MG tablet     Refill Fioricet for migraines, will do testing for for infertility but do recommend for her spouse to get tested  Follow up plan: Return in about 1 month (around 04/03/2018), or if symptoms worsen or fail to improve, for Recheck fertility issues.  Counseling provided for all of the vaccine components Orders Placed This Encounter  Procedures  . Tdap vaccine greater than or equal to 7yo IM  . Alb+Prl+FSH+LH+Prog+DHEA+Es...    Caryl Pina, MD [facility] 03/06/2018, 9:55 AM

## 2018-03-06 NOTE — Patient Instructions (Signed)
Infertility Infertility is when you are unable to get pregnant (conceive) after a year of having sex regularly without using birth control. Infertility can also mean that a woman is not able to carry a pregnancy to full term. Both women and men can have fertility problems. What causes infertility? What Causes Infertility in Women? There are many possible causes of infertility in women. For some women, the cause of infertility is not known (unexplained infertility). Infertility can also be linked to more than one cause. Infertility problems in women can be caused by problems with the menstrual cycle or reproductive organs, certain medical conditions, and factors related to lifestyle and age.  Problems with your menstrual cycle can interfere with your ovaries producing eggs (ovulation). This can make it difficult to get pregnant. This includes having a menstrual cycle that is very long, very short, or irregular.  Problems with reproductive organs can include: ? An abnormally narrow cervix or a cervix that does not remain closed during a pregnancy. ? A blockage in your fallopian tubes. ? An abnormally shaped uterus. ? Uterine fibroids. This is a tissue mass (tumor) that can develop on your uterus.  Medical conditions that can affect a woman's fertility include: ? Polycystic ovarian syndrome (PCOS). This is a hormonal disorder that can cause small cysts to grow on your ovaries. This is the most common cause of infertility in women. ? Endometriosis. This is a condition in which the tissue that lines your uterus (endometrium) grows outside of its normal location. ? Primary ovary insufficiency. This is when your ovaries stop producing eggs and hormones before the age of 12. ? Sexually transmitted diseases, such as chlamydia or gonorrhea. These infections can cause scarring in your fallopian tubes. This makes it difficult for eggs to reach your uterus. ? Autoimmune disorders. These are disorders in which  your immune system attacks normal, healthy cells. ? Hormone imbalances.  Other factors include: ? Age. A woman's fertility declines with age, especially after her mid-59s. ? Being under- or overweight. ? Drinking too much alcohol. ? Using drugs. ? Exercising excessively. ? Being exposed to environmental toxins, such as radiation, pesticides, and certain chemicals.  What Causes Infertility in Men? There are many causes of infertility in men. Infertility can be linked to more than one cause. Infertility problems in men can be caused by problems with sperm or the reproductive organs, certain medical conditions, and factors related to lifestyle and age. Some men have unexplained infertility.  Problems with sperm. Infertility can result if there is a problem producing: ? Enough sperm (low sperm count). ? Enough normally-shaped sperm (sperm morphology). ? Sperm that are able to reach the egg (poor motility).  Infertility can also be caused by: ? A problem with hormones. ? Enlarged veins (varicoceles), cysts (spermatoceles), or tumors of the testicles. ? Sexual dysfunction. ? Injury to the testicles. ? A birth defect, such as not having the tubes that carry sperm (vas deferens).  Medical conditions that can affect a man's fertility include: ? Diabetes. ? Cancer treatments, such as chemotherapy or radiation. ? Klinefelter syndrome. This is an inherited genetic disorder. ? Thyroid problems, such as an under- or overactive thyroid. ? Cystic fibrosis. ? Sexually transmitted diseases.  Other factors include: ? Age. A man's fertility declines with age. ? Drinking too much alcohol. ? Using drugs. ? Being exposed to environmental toxins, such as pesticides and lead.  What are the symptoms of infertility? Being unable to get pregnant after one year of having regular  sex without using birth control is the only sign of infertility. How is infertility diagnosed? In order to be diagnosed with  infertility, both partners will have a physical exam. Both partners will also have an extensive medical and sexual history taken. If there is no obvious reason for infertility, additional tests may be done. What Tests Will Women Have? Women may first have tests to check whether they are ovulating each month. The tests may include:  Blood tests to check hormone levels.  Women who are ovulating may have additional tests. These may include:  Hysterosalpingography. ? This is an X-ray of the fallopian tubes and uterus taken after a specific type of dye is injected. ? This test can show the shape of the uterus and whether the fallopian tubes are open.  Laparoscopy. ? In this test, a lighted tube (laparoscope) is used to look for problems in the fallopian tubes and other female organs.  Transvaginal ultrasound. ? This is an imaging test to check for abnormalities of the uterus and ovaries. ? A health care provider can use this test to count the number of follicles on the ovaries.  Hysteroscopy. ? This test involves using a lighted tube to examine the cervix and inside the uterus. ? It is done to find any abnormalities inside the uterus.  What Tests Will Men Have? Tests for men's infertility includes:  Semen tests to check sperm count, morphology, and motility.  Blood tests to check for hormone levels.  Taking a small sample of tissue from inside a testicle (biopsy). This is examined under a microscope.  Blood tests to check for genetic abnormalities (genetic testing).  How are women treated for infertility? Treatment depends on the cause of infertility. Most cases of infertility in women are treated with medicine or surgery.  Women may take medicine to: ? Correct ovulation problems. ? Treat other health conditions, such as PCOS.  Surgery may be done to: ? Repair damage to the ovaries, fallopian tubes, cervix, or uterus. ? Remove growths from the uterus. ? Remove scar tissue from  the uterus, pelvis, or other female organs.  How are men treated for infertility? Treatment depends on the cause of infertility. Most cases of infertility in men are treated with medicine or surgery.  Men may take medicine to: ? Correct hormone problems. ? Treat other health conditions. ? Treat sexual dysfunction.  Surgery may be done to: ? Remove blockages in the reproductive tract. ? Correct other structural problems of the reproductive tract.  What is assisted reproductive technology? Assisted reproductive technology (ART) refers to all treatments and procedures that combine eggs and sperm outside the body to try to help a couple conceive. ART is often combined with fertility drugs to stimulate ovulation. Sometimes ART is done using eggs retrieved from another woman's body (donor eggs) or from previously frozen fertilized eggs (embryos). There are different types of ART. These include:  Intrauterine insemination (IUI). ? In this procedure, sperm is placed directly into a woman's uterus with a long, thin tube. ? This may be most effective for infertility caused by sperm problems, including low sperm count and low motility. ? Can be used in combination with fertility drugs.  In vitro fertilization (IVF). ? This is often done when a woman's fallopian tubes are blocked or when a man has low sperm counts. ? Fertility drugs stimulate the ovaries to produce multiple eggs. Once mature, these eggs are removed from the body and combined with the sperm to be fertilized. ? These  fertilized eggs are then placed in the woman's uterus.  This information is not intended to replace advice given to you by your health care provider. Make sure you discuss any questions you have with your health care provider. Document Released: 10/27/2003 Document Revised: 03/25/2016 Document Reviewed: 07/09/2014 Elsevier Interactive Patient Education  2018 [employer].

## 2018-03-08 ENCOUNTER — Telehealth: Payer: Self-pay | Admitting: Family Medicine

## 2018-03-08 LAB — ALB+PRL+FSH+LH+PROG+DHEA+ES...
Albumin: 4.1 g/dL (ref 3.5–5.5)
Dehydroepiandrosterone: 146 ng/dL (ref 31–701)
ESTROGEN: 170 pg/mL
FSH: 5.8 m[IU]/mL
LH: 7.9 m[IU]/mL
PROGESTERONE: 0.2 ng/mL
Prolactin: 7.2 ng/mL (ref 4.8–23.3)
SEX HORMONE BINDING: 49.7 nmol/L (ref 24.6–122.0)
VIT D 25 HYDROXY: 14.3 ng/mL — AB (ref 30.0–100.0)

## 2018-03-08 NOTE — Telephone Encounter (Signed)
Patient aware that she can get vitamin d OTC and migraine med was sent in yesterday.

## 2018-04-05 ENCOUNTER — Ambulatory Visit (INDEPENDENT_AMBULATORY_CARE_PROVIDER_SITE_OTHER): Payer: Medicaid Other | Admitting: Family Medicine

## 2018-04-05 ENCOUNTER — Encounter: Payer: Self-pay | Admitting: Family Medicine

## 2018-04-05 VITALS — BP 137/81 | HR 70 | Temp 97.4°F | Ht 69.0 in | Wt 218.0 lb

## 2018-04-05 DIAGNOSIS — N979 Female infertility, unspecified: Secondary | ICD-10-CM

## 2018-04-05 DIAGNOSIS — Z3169 Encounter for other general counseling and advice on procreation: Secondary | ICD-10-CM

## 2018-04-05 DIAGNOSIS — E669 Obesity, unspecified: Secondary | ICD-10-CM | POA: Insufficient documentation

## 2018-04-05 DIAGNOSIS — Z720 Tobacco use: Secondary | ICD-10-CM | POA: Insufficient documentation

## 2018-04-05 NOTE — Progress Notes (Signed)
BP 137/81   Pulse 70   Temp (!) 97.4 F (36.3 C) (Oral)   Ht 5\' 9"  (1.753 m)   Wt 218 lb (98.9 kg)   BMI 32.19 kg/m    Subjective:    Patient ID: Kaitlin Torres, female    DOB: [birth year], 37 y.o.   MRN: [id number]  HPI: Kaitlin Torres is a 37 y.o. female presenting on 04/05/2018 for Infertility   HPI Infertility Patient is coming in for fertility results and discussion.  She says that her husband and her have been trying for over a year.  She has had 3 previous pregnancies sometime ago.  She has gained some weight since then and has been diagnosed with polycystic ovarian syndrome.  She still has regular monthly cycles and does not usually miss them.  He has never had a child and is scheduled still to get his fertility treatment but has not had it yet.  She does have some of the signs of Precose with so excessive hair growth on the face.  She has been focusing more on trying at the right times in her cycles every month.  She does admit that she still smokes and we have encouraged smoking cessation to increase fertility  Relevant past medical, surgical, family and social history reviewed and updated as indicated. Interim medical history since our last visit reviewed. Allergies and medications reviewed and updated.  Review of Systems  Constitutional: Negative for chills and fever.  Eyes: Negative for visual disturbance.  Respiratory: Negative for chest tightness and shortness of breath.   Cardiovascular: Negative for chest pain and leg swelling.  Genitourinary: Negative for menstrual problem, vaginal bleeding, vaginal discharge and vaginal pain.  Musculoskeletal: Negative for back pain and gait problem.  Skin: Negative for rash.  All other systems reviewed and are negative.   Per HPI unless specifically indicated above   Allergies as of 04/05/2018   No Known Allergies     Medication List        Accurate as of 04/05/18  9:57 AM. Always use your most recent med list.            ALEVE 220 MG Caps Generic drug:  Naproxen Sodium Take by mouth as needed.   butalbital-acetaminophen-caffeine 50-325-40 MG tablet Commonly known as:  FIORICET, ESGIC Take 1 tablet by mouth every 6 (six) hours as needed for headache.   Vitamin D-3 5000 units Tabs Take 5,000 Units by mouth daily.          Objective:    BP 137/81   Pulse 70   Temp (!) 97.4 F (36.3 C) (Oral)   Ht 5\' 9"  (1.753 m)   Wt 218 lb (98.9 kg)   BMI 32.19 kg/m   Wt Readings from Last 3 Encounters:  04/05/18 218 lb (98.9 kg)  03/06/18 216 lb (98 kg)  01/16/18 216 lb (98 kg)    Physical Exam  Constitutional: She is oriented to person, place, and time. She appears well-developed and well-nourished. No distress.  Eyes: Conjunctivae are normal.  Cardiovascular: Normal rate, regular rhythm, normal heart sounds and intact distal pulses.  No murmur heard. Pulmonary/Chest: Effort normal and breath sounds normal. No respiratory distress. She has no wheezes.  Neurological: She is alert and oriented to person, place, and time. Coordination normal.  Skin: Skin is warm and dry. No rash noted. She is not diaphoretic.  Psychiatric: She has a normal mood and affect. Her behavior is normal.  Nursing note and vitals  reviewed.      Assessment & Plan:   Problem List Items Addressed This Visit      Other   Tobacco abuse   Obesity (BMI 30.0-34.9)    Other Visit Diagnoses    Infertility counseling    -  Primary    We are still awaiting her husband's fertility results she will schedule back after that.  Follow up plan: Return if symptoms worsen or fail to improve, for Schedule follow-up for after her husband's fertility results.  Counseling provided for all of the vaccine components No orders of the defined types were placed in this encounter.   Caryl Pina, MD [facility] 04/05/2018, 9:57 AM

## 2018-05-08 ENCOUNTER — Ambulatory Visit: Payer: Medicaid Other | Admitting: Family Medicine

## 2018-05-08 ENCOUNTER — Telehealth: Payer: Self-pay | Admitting: Family Medicine

## 2018-05-08 ENCOUNTER — Encounter: Payer: Self-pay | Admitting: Family Medicine

## 2018-05-08 ENCOUNTER — Ambulatory Visit (INDEPENDENT_AMBULATORY_CARE_PROVIDER_SITE_OTHER): Payer: Medicaid Other | Admitting: Family Medicine

## 2018-05-08 VITALS — BP 125/87 | HR 75 | Temp 98.3°F | Ht 69.0 in | Wt 223.0 lb

## 2018-05-08 DIAGNOSIS — B373 Candidiasis of vulva and vagina: Secondary | ICD-10-CM

## 2018-05-08 DIAGNOSIS — B3731 Acute candidiasis of vulva and vagina: Secondary | ICD-10-CM

## 2018-05-08 LAB — PREGNANCY, URINE: Preg Test, Ur: NEGATIVE

## 2018-05-08 MED ORDER — FLUCONAZOLE 150 MG PO TABS: ORAL_TABLET | ORAL | 0 refills | Status: DC

## 2018-05-08 NOTE — Progress Notes (Signed)
   HPI  Patient presents today for vaginal discharge.  Patient states that this is typical of previous yeast infections that she has had.  She reports a few days of vaginal discharge that is described as white and chunky.  She also also has vaginal itching.  She is sexually active and hoping to become pregnant, her last period was about 6 weeks ago.  She denies any breast tenderness. No nausea either  PMH: Smoking status noted ROS: Per HPI  Objective: BP 125/87 (BP Location: Left Arm, Patient Position: Sitting, Cuff Size: Normal)   Pulse 75   Temp 98.3 F (36.8 C) (Oral)   Ht 5\' 9"  (1.753 m)   Wt 223 lb (101.2 kg)   BMI 32.93 kg/m  Gen: NAD, alert, cooperative with exam HEENT: NCAT CV: RRR, good S1/S2, no murmur Resp: CTABL, no wheezes, non-labored Ext: No edema, warm Neuro: Alert and oriented, No gross deficits  Assessment and plan:  #Vaginal discharge, yeast vaginitis Likely diagnosis Diflucan x2 Urine pregnancy with slightly late period- negative    Orders Placed This Encounter  Procedures  . Pregnancy, urine    Meds ordered this encounter  Medications  . fluconazole (DIFLUCAN) 150 MG tablet    Sig: Take one pill and repeat in 3 days    Dispense:  2 tablet    Refill:  [place], MD [facility] 05/08/2018, 6:23 PM

## 2018-05-08 NOTE — Patient Instructions (Signed)
Great to see you!   

## 2018-05-08 NOTE — Telephone Encounter (Signed)
Returned patient's phone call.  Patient states she has vaginal discharge and vaginal itching that started yesterday.  Patient states that she would like for something to be sen to pharmacy for a yeast infection

## 2018-05-08 NOTE — Telephone Encounter (Signed)
Has appt at 5pm today

## 2018-05-08 NOTE — Telephone Encounter (Signed)
Will need to be seen or could do an Evisit.

## 2018-05-16 ENCOUNTER — Ambulatory Visit: Payer: Medicaid Other | Admitting: Pediatrics

## 2018-05-16 ENCOUNTER — Encounter: Payer: Self-pay | Admitting: Pediatrics

## 2018-05-16 VITALS — BP 124/84 | HR 67 | Temp 97.1°F | Ht 69.0 in | Wt 222.0 lb

## 2018-05-16 DIAGNOSIS — N970 Female infertility associated with anovulation: Secondary | ICD-10-CM | POA: Diagnosis not present

## 2018-05-16 LAB — PREGNANCY, URINE: PREG TEST UR: NEGATIVE

## 2018-05-16 NOTE — Progress Notes (Signed)
  Subjective:   Patient ID: Kaitlin Torres, female    DOB: 1981/03/22, 37 y.o.   MRN: [id number] CC: No period this month  HPI: Kaitlin Torres is a 38 y.o. female   LMP 5/25-5/30. Usually periods every 30 days, last for about 5 days..  She has not yet had a period.  Home pregnancy test 1 week ago was negative.  She has been working with her PCP regarding fertility.  She and her partner been trying to have a baby for the last 1.5 years.  Has otherwise been feeling her normal self, though she briefly felt nauseous yesterday.  No vaginal discharge.  No pain with intercourse.  No abdominal pain or cramping.  Relevant past medical, surgical, family and social history reviewed. Allergies and medications reviewed and updated. Social History   Tobacco Use  Smoking Status Current Every Day Smoker  . Packs/day: 1.00  . Years: 26.00  . Pack years: 26.00  . Types: Cigarettes  Smokeless Tobacco Never Used   ROS: Per HPI   Objective:    BP 124/84   Pulse 67   Temp (!) 97.1 F (36.2 C) (Oral)   Ht 5\' 9"  (1.753 m)   Wt 222 lb (100.7 kg)   BMI 32.78 kg/m   Wt Readings from Last 3 Encounters:  05/16/18 222 lb (100.7 kg)  05/08/18 223 lb (101.2 kg)  04/05/18 218 lb (98.9 kg)    Gen: NAD, alert, cooperative with exam, NCAT EYES: EOMI, no conjunctival injection, or no icterus ENT:  TMs pearly gray b/l, OP without erythema LYMPH: no cervical LAD CV: NRRR, normal S1/S2, no murmur, distal pulses 2+ b/l Resp: CTABL, no wheezes, normal WOB Abd: +BS, soft, NTND. no guarding or organomegaly Ext: No edema, warm Neuro: Alert and oriented, strength equal b/l UE and LE, coordination grossly normal MSK: normal muscle bulk  Assessment & Plan:  Kaitlin Torres was seen today for no period this month.  Diagnoses and all orders for this visit:  Anovulatory Check TSH.  Continue healthy lifestyle. -     TSH -     Pregnancy, urine   Follow up plan: Follow-up with PCP as scheduled. Assunta Found, MD [place]

## 2018-05-17 ENCOUNTER — Telehealth: Payer: Self-pay | Admitting: Family Medicine

## 2018-05-17 LAB — TSH: TSH: 0.933 u[IU]/mL (ref 0.450–4.500)

## 2018-05-17 NOTE — Telephone Encounter (Signed)
Patient aware of results, appointment made on 02/12/2018

## 2018-06-14 ENCOUNTER — Ambulatory Visit: Payer: Medicaid Other | Admitting: Family Medicine

## 2018-06-21 ENCOUNTER — Encounter: Payer: Self-pay | Admitting: Family Medicine

## 2018-07-02 DIAGNOSIS — R05 Cough: Secondary | ICD-10-CM | POA: Diagnosis not present

## 2018-07-02 DIAGNOSIS — R0602 Shortness of breath: Secondary | ICD-10-CM | POA: Diagnosis not present

## 2018-07-02 DIAGNOSIS — M549 Dorsalgia, unspecified: Secondary | ICD-10-CM | POA: Diagnosis not present

## 2018-07-02 DIAGNOSIS — J40 Bronchitis, not specified as acute or chronic: Secondary | ICD-10-CM | POA: Diagnosis not present

## 2018-07-19 ENCOUNTER — Telehealth: Payer: Self-pay | Admitting: *Deleted

## 2018-07-19 ENCOUNTER — Ambulatory Visit: Payer: Medicaid Other | Admitting: Neurology

## 2018-07-19 NOTE — Telephone Encounter (Signed)
Patient called the office at noon to cancel 3pm appt.  Stated she was called into work.

## 2018-07-27 ENCOUNTER — Emergency Department (HOSPITAL_COMMUNITY): Payer: Medicaid Other

## 2018-07-27 ENCOUNTER — Encounter (HOSPITAL_COMMUNITY): Payer: Self-pay | Admitting: Emergency Medicine

## 2018-07-27 ENCOUNTER — Emergency Department (HOSPITAL_COMMUNITY)
Admission: EM | Admit: 2018-07-27 | Discharge: 2018-07-27 | Disposition: A | Discharge status: Home / Self Care | Payer: Medicaid Other | Attending: Emergency Medicine | Admitting: Emergency Medicine

## 2018-07-27 ENCOUNTER — Other Ambulatory Visit: Payer: Self-pay

## 2018-07-27 DIAGNOSIS — I10 Essential (primary) hypertension: Secondary | ICD-10-CM | POA: Diagnosis not present

## 2018-07-27 DIAGNOSIS — M25521 Pain in right elbow: Secondary | ICD-10-CM | POA: Insufficient documentation

## 2018-07-27 DIAGNOSIS — M25512 Pain in left shoulder: Secondary | ICD-10-CM | POA: Insufficient documentation

## 2018-07-27 DIAGNOSIS — F17210 Nicotine dependence, cigarettes, uncomplicated: Secondary | ICD-10-CM | POA: Insufficient documentation

## 2018-07-27 DIAGNOSIS — S59901A Unspecified injury of right elbow, initial encounter: Secondary | ICD-10-CM | POA: Diagnosis not present

## 2018-07-27 DIAGNOSIS — S4992XA Unspecified injury of left shoulder and upper arm, initial encounter: Secondary | ICD-10-CM | POA: Diagnosis not present

## 2018-07-27 DIAGNOSIS — M546 Pain in thoracic spine: Secondary | ICD-10-CM | POA: Diagnosis not present

## 2018-07-27 DIAGNOSIS — S299XXA Unspecified injury of thorax, initial encounter: Secondary | ICD-10-CM | POA: Diagnosis not present

## 2018-07-27 DIAGNOSIS — S60811A Abrasion of right wrist, initial encounter: Secondary | ICD-10-CM | POA: Diagnosis not present

## 2018-07-27 LAB — POC URINE PREG, ED: Preg Test, Ur: NEGATIVE

## 2018-07-27 MED ORDER — METHOCARBAMOL 500 MG PO TABS: 500.0000 mg | ORAL_TABLET | Freq: Three times a day (TID) | ORAL | 0 refills | Status: DC | PRN

## 2018-07-27 MED ORDER — NAPROXEN 500 MG PO TABS: 500.0000 mg | ORAL_TABLET | Freq: Two times a day (BID) | ORAL | 0 refills | Status: DC

## 2018-07-27 MED ORDER — TETANUS-DIPHTH-ACELL PERTUSSIS 5-2.5-18.5 LF-MCG/0.5 IM SUSP
0.5000 mL | Freq: Once | INTRAMUSCULAR | Status: DC
  Filled 2018-07-27: qty 0.5

## 2018-07-27 MED ORDER — NAPROXEN 250 MG PO TABS
500.0000 mg | ORAL_TABLET | Freq: Once | ORAL | Status: AC
  Administered 2018-07-27: 500 mg via ORAL
  Filled 2018-07-27: qty 2
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
## 2018-08-01 ENCOUNTER — Encounter (HOSPITAL_COMMUNITY): Payer: Self-pay | Admitting: Neurology

## 2018-08-01 ENCOUNTER — Emergency Department (HOSPITAL_COMMUNITY)
Admission: EM | Admit: 2018-08-01 | Discharge: 2018-08-01 | Disposition: A | Discharge status: Home / Self Care | Payer: Medicaid Other | Attending: Emergency Medicine | Admitting: Emergency Medicine

## 2018-08-01 DIAGNOSIS — I10 Essential (primary) hypertension: Secondary | ICD-10-CM | POA: Insufficient documentation

## 2018-08-01 DIAGNOSIS — Z79899 Other long term (current) drug therapy: Secondary | ICD-10-CM | POA: Diagnosis not present

## 2018-08-01 DIAGNOSIS — F17210 Nicotine dependence, cigarettes, uncomplicated: Secondary | ICD-10-CM | POA: Diagnosis not present

## 2018-08-01 DIAGNOSIS — M79602 Pain in left arm: Secondary | ICD-10-CM | POA: Diagnosis not present
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
## 2018-08-06 DIAGNOSIS — M25512 Pain in left shoulder: Secondary | ICD-10-CM | POA: Diagnosis not present

## 2018-08-06 DIAGNOSIS — N979 Female infertility, unspecified: Secondary | ICD-10-CM | POA: Diagnosis not present

## 2018-08-16 DIAGNOSIS — M7542 Impingement syndrome of left shoulder: Secondary | ICD-10-CM | POA: Diagnosis not present

## 2018-08-16 DIAGNOSIS — M25512 Pain in left shoulder: Secondary | ICD-10-CM | POA: Diagnosis not present

## 2018-08-23 DIAGNOSIS — M25512 Pain in left shoulder: Secondary | ICD-10-CM | POA: Diagnosis not present

## 2018-08-23 DIAGNOSIS — M7542 Impingement syndrome of left shoulder: Secondary | ICD-10-CM | POA: Diagnosis not present

## 2018-09-27 DIAGNOSIS — M7542 Impingement syndrome of left shoulder: Secondary | ICD-10-CM | POA: Diagnosis not present

## 2018-10-08 ENCOUNTER — Ambulatory Visit: Payer: Medicaid Other | Admitting: Neurology

## 2018-10-08 ENCOUNTER — Ambulatory Visit: Payer: Self-pay | Admitting: Neurology

## 2018-10-08 ENCOUNTER — Telehealth: Payer: Self-pay | Admitting: *Deleted

## 2018-10-08 DIAGNOSIS — R5383 Other fatigue: Secondary | ICD-10-CM | POA: Diagnosis not present

## 2018-10-08 DIAGNOSIS — N926 Irregular menstruation, unspecified: Secondary | ICD-10-CM | POA: Diagnosis not present

## 2018-10-08 NOTE — Telephone Encounter (Signed)
No showed follow up appointment. 

## 2018-10-09 ENCOUNTER — Encounter: Payer: Self-pay | Admitting: Neurology

## 2018-10-13 DIAGNOSIS — R197 Diarrhea, unspecified: Secondary | ICD-10-CM | POA: Diagnosis not present

## 2018-10-22 ENCOUNTER — Encounter (HOSPITAL_COMMUNITY): Payer: Self-pay | Admitting: Emergency Medicine

## 2018-10-22 ENCOUNTER — Emergency Department (HOSPITAL_COMMUNITY)
Admission: EM | Admit: 2018-10-22 | Discharge: 2018-10-22 | Disposition: A | Discharge status: Home / Self Care | Payer: Medicaid Other | Attending: Emergency Medicine | Admitting: Emergency Medicine

## 2018-10-22 ENCOUNTER — Other Ambulatory Visit: Payer: Self-pay

## 2018-10-22 DIAGNOSIS — W268XXA Contact with other sharp object(s), not elsewhere classified, initial encounter: Secondary | ICD-10-CM | POA: Diagnosis not present

## 2018-10-22 DIAGNOSIS — S00402A Unspecified superficial injury of left ear, initial encounter: Secondary | ICD-10-CM | POA: Diagnosis present

## 2018-10-22 DIAGNOSIS — I10 Essential (primary) hypertension: Secondary | ICD-10-CM | POA: Diagnosis not present

## 2018-10-22 DIAGNOSIS — Y93E8 Activity, other personal hygiene: Secondary | ICD-10-CM | POA: Diagnosis not present

## 2018-10-22 DIAGNOSIS — S00412A Abrasion of left ear, initial encounter: Secondary | ICD-10-CM

## 2018-10-22 DIAGNOSIS — Y929 Unspecified place or not applicable: Secondary | ICD-10-CM | POA: Insufficient documentation

## 2018-10-22 DIAGNOSIS — Y999 Unspecified external cause status: Secondary | ICD-10-CM | POA: Insufficient documentation

## 2018-10-22 DIAGNOSIS — F17210 Nicotine dependence, cigarettes, uncomplicated: Secondary | ICD-10-CM | POA: Diagnosis not present
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
## 2018-10-22 NOTE — Discharge Instructions (Addendum)
Put triple antibiotic ointment on a cotton ball and put it in your ear canal. It should stop bleeding over the next couple of days and be healed in about a week - 10 days. Recheck if you get pain, swelling, can't hear or get swelling of your ear or face.

## 2018-11-08 DIAGNOSIS — J302 Other seasonal allergic rhinitis: Secondary | ICD-10-CM | POA: Diagnosis not present

## 2018-11-08 DIAGNOSIS — H9202 Otalgia, left ear: Secondary | ICD-10-CM | POA: Diagnosis not present

## 2018-11-08 DIAGNOSIS — R05 Cough: Secondary | ICD-10-CM | POA: Diagnosis not present

## 2018-12-13 ENCOUNTER — Ambulatory Visit: Payer: No Typology Code available for payment source | Admitting: Physical Therapy

## 2018-12-18 ENCOUNTER — Ambulatory Visit: Payer: No Typology Code available for payment source | Admitting: Physical Therapy

## 2019-01-02 ENCOUNTER — Ambulatory Visit: Payer: Medicaid Other | Admitting: Physical Therapy

## 2019-01-09 ENCOUNTER — Encounter: Payer: Self-pay | Admitting: Physical Therapy

## 2019-01-09 ENCOUNTER — Ambulatory Visit: Payer: Medicaid Other | Attending: Family | Admitting: Physical Therapy

## 2019-01-09 DIAGNOSIS — M25512 Pain in left shoulder: Secondary | ICD-10-CM | POA: Diagnosis not present

## 2019-01-09 DIAGNOSIS — M6281 Muscle weakness (generalized): Secondary | ICD-10-CM | POA: Diagnosis not present

## 2019-01-09 DIAGNOSIS — R293 Abnormal posture: Secondary | ICD-10-CM | POA: Diagnosis not present
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
## 2019-02-08 DIAGNOSIS — M79673 Pain in unspecified foot: Secondary | ICD-10-CM | POA: Diagnosis not present

## 2019-02-08 DIAGNOSIS — N939 Abnormal uterine and vaginal bleeding, unspecified: Secondary | ICD-10-CM | POA: Diagnosis not present

## 2019-03-01 ENCOUNTER — Telehealth: Payer: Self-pay | Admitting: Women's Health

## 2019-03-01 ENCOUNTER — Telehealth: Payer: Self-pay | Admitting: Obstetrics & Gynecology

## 2019-03-01 MED ORDER — MEGESTROL ACETATE 40 MG PO TABS: ORAL_TABLET | ORAL | 3 refills | Status: DC

## 2019-03-01 NOTE — Telephone Encounter (Signed)
Megestrol e prescribed

## 2019-03-01 NOTE — Telephone Encounter (Signed)
Pt reports having two cycles this month. The first was from 3/31- 4/11. She then started again on 4/18. Pt not on birth control. She is having a lot of cramps, and clots. She states that she is changing her pad every hour. She denies dizziness or lightheadedness.  No new sexual partners. Please advise.

## 2019-03-01 NOTE — Telephone Encounter (Signed)
Pt having 2cond cycle of the month/ heavy and a lot of cramping/ please advise/ if needs to make appt

## 2019-03-01 NOTE — Telephone Encounter (Signed)
Pt informed to pick up megace.

## 2019-03-06 DIAGNOSIS — N939 Abnormal uterine and vaginal bleeding, unspecified: Secondary | ICD-10-CM | POA: Diagnosis not present

## 2019-03-06 DIAGNOSIS — N926 Irregular menstruation, unspecified: Secondary | ICD-10-CM | POA: Diagnosis not present

## 2019-03-06 LAB — HM PAP SMEAR: HM Pap smear: NEGATIVE

## 2019-03-12 DIAGNOSIS — N938 Other specified abnormal uterine and vaginal bleeding: Secondary | ICD-10-CM | POA: Diagnosis not present

## 2019-04-08 DIAGNOSIS — I219 Acute myocardial infarction, unspecified: Secondary | ICD-10-CM

## 2019-04-08 HISTORY — DX: Acute myocardial infarction, unspecified: I21.9

## 2019-04-10 ENCOUNTER — Encounter: Payer: Self-pay | Admitting: Physical Therapy

## 2019-04-10 ENCOUNTER — Ambulatory Visit: Payer: Medicaid Other | Attending: Family | Admitting: Physical Therapy

## 2019-04-10 ENCOUNTER — Other Ambulatory Visit: Payer: Self-pay

## 2019-04-10 DIAGNOSIS — R293 Abnormal posture: Secondary | ICD-10-CM | POA: Insufficient documentation

## 2019-04-10 DIAGNOSIS — M25512 Pain in left shoulder: Secondary | ICD-10-CM | POA: Insufficient documentation

## 2019-04-10 DIAGNOSIS — M6281 Muscle weakness (generalized): Secondary | ICD-10-CM | POA: Insufficient documentation
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
## 2019-04-15 DIAGNOSIS — N938 Other specified abnormal uterine and vaginal bleeding: Secondary | ICD-10-CM | POA: Diagnosis not present

## 2019-04-18 DIAGNOSIS — R110 Nausea: Secondary | ICD-10-CM | POA: Diagnosis not present

## 2019-04-18 DIAGNOSIS — R109 Unspecified abdominal pain: Secondary | ICD-10-CM | POA: Diagnosis not present

## 2019-04-18 DIAGNOSIS — R42 Dizziness and giddiness: Secondary | ICD-10-CM | POA: Diagnosis not present

## 2019-04-19 DIAGNOSIS — R109 Unspecified abdominal pain: Secondary | ICD-10-CM | POA: Diagnosis not present

## 2019-05-06 DIAGNOSIS — R55 Syncope and collapse: Secondary | ICD-10-CM | POA: Diagnosis not present

## 2019-05-06 DIAGNOSIS — R531 Weakness: Secondary | ICD-10-CM | POA: Diagnosis not present

## 2019-05-06 DIAGNOSIS — R079 Chest pain, unspecified: Secondary | ICD-10-CM | POA: Diagnosis not present

## 2019-05-08 ENCOUNTER — Emergency Department (HOSPITAL_COMMUNITY): Payer: Medicaid Other

## 2019-05-08 ENCOUNTER — Other Ambulatory Visit: Payer: Self-pay

## 2019-05-08 ENCOUNTER — Observation Stay (HOSPITAL_COMMUNITY): Payer: Medicaid Other

## 2019-05-08 ENCOUNTER — Encounter (HOSPITAL_COMMUNITY): Payer: Self-pay | Admitting: Cardiology

## 2019-05-08 ENCOUNTER — Encounter (HOSPITAL_COMMUNITY)
Admission: EM | Disposition: A | Discharge status: Home / Self Care | Payer: Self-pay | Source: Home / Self Care | Attending: Internal Medicine

## 2019-05-08 ENCOUNTER — Inpatient Hospital Stay (HOSPITAL_COMMUNITY)
Admission: EM | Admit: 2019-05-08 | Discharge: 2019-05-09 | DRG: 247 | Disposition: A | Discharge status: Home / Self Care | Payer: Medicaid Other | Attending: Internal Medicine | Admitting: Internal Medicine

## 2019-05-08 DIAGNOSIS — D72829 Elevated white blood cell count, unspecified: Secondary | ICD-10-CM | POA: Diagnosis not present

## 2019-05-08 DIAGNOSIS — I214 Non-ST elevation (NSTEMI) myocardial infarction: Secondary | ICD-10-CM | POA: Diagnosis not present

## 2019-05-08 DIAGNOSIS — Z1159 Encounter for screening for other viral diseases: Secondary | ICD-10-CM

## 2019-05-08 DIAGNOSIS — I472 Ventricular tachycardia: Secondary | ICD-10-CM | POA: Diagnosis present

## 2019-05-08 DIAGNOSIS — Z79899 Other long term (current) drug therapy: Secondary | ICD-10-CM

## 2019-05-08 DIAGNOSIS — H9190 Unspecified hearing loss, unspecified ear: Secondary | ICD-10-CM | POA: Diagnosis present

## 2019-05-08 DIAGNOSIS — Z833 Family history of diabetes mellitus: Secondary | ICD-10-CM

## 2019-05-08 DIAGNOSIS — E785 Hyperlipidemia, unspecified: Secondary | ICD-10-CM | POA: Diagnosis not present

## 2019-05-08 DIAGNOSIS — I2510 Atherosclerotic heart disease of native coronary artery without angina pectoris: Secondary | ICD-10-CM | POA: Diagnosis not present

## 2019-05-08 DIAGNOSIS — I252 Old myocardial infarction: Secondary | ICD-10-CM | POA: Diagnosis present

## 2019-05-08 DIAGNOSIS — I10 Essential (primary) hypertension: Secondary | ICD-10-CM

## 2019-05-08 DIAGNOSIS — R55 Syncope and collapse: Secondary | ICD-10-CM | POA: Diagnosis not present

## 2019-05-08 DIAGNOSIS — R0600 Dyspnea, unspecified: Secondary | ICD-10-CM | POA: Diagnosis not present

## 2019-05-08 DIAGNOSIS — Z20828 Contact with and (suspected) exposure to other viral communicable diseases: Secondary | ICD-10-CM | POA: Diagnosis not present

## 2019-05-08 DIAGNOSIS — Z8041 Family history of malignant neoplasm of ovary: Secondary | ICD-10-CM

## 2019-05-08 DIAGNOSIS — R079 Chest pain, unspecified: Secondary | ICD-10-CM

## 2019-05-08 DIAGNOSIS — Z955 Presence of coronary angioplasty implant and graft: Secondary | ICD-10-CM

## 2019-05-08 DIAGNOSIS — F17210 Nicotine dependence, cigarettes, uncomplicated: Secondary | ICD-10-CM | POA: Diagnosis present

## 2019-05-08 DIAGNOSIS — I119 Hypertensive heart disease without heart failure: Secondary | ICD-10-CM | POA: Diagnosis present

## 2019-05-08 DIAGNOSIS — Z8249 Family history of ischemic heart disease and other diseases of the circulatory system: Secondary | ICD-10-CM

## 2019-05-08 DIAGNOSIS — E7800 Pure hypercholesterolemia, unspecified: Secondary | ICD-10-CM | POA: Diagnosis present

## 2019-05-08 DIAGNOSIS — Z8349 Family history of other endocrine, nutritional and metabolic diseases: Secondary | ICD-10-CM

## 2019-05-08 DIAGNOSIS — F410 Panic disorder [episodic paroxysmal anxiety] without agoraphobia: Secondary | ICD-10-CM | POA: Diagnosis present

## 2019-05-08 DIAGNOSIS — E282 Polycystic ovarian syndrome: Secondary | ICD-10-CM | POA: Diagnosis present

## 2019-05-08 DIAGNOSIS — Z823 Family history of stroke: Secondary | ICD-10-CM

## 2019-05-08 DIAGNOSIS — F419 Anxiety disorder, unspecified: Secondary | ICD-10-CM | POA: Diagnosis present

## 2019-05-08 DIAGNOSIS — I25119 Atherosclerotic heart disease of native coronary artery with unspecified angina pectoris: Secondary | ICD-10-CM | POA: Diagnosis present

## 2019-05-08 DIAGNOSIS — G930 Cerebral cysts: Secondary | ICD-10-CM

## 2019-05-08 DIAGNOSIS — Z8241 Family history of sudden cardiac death: Secondary | ICD-10-CM

## 2019-05-08 HISTORY — PX: CORONARY STENT INTERVENTION: CATH118234

## 2019-05-08 HISTORY — PX: LEFT HEART CATH AND CORONARY ANGIOGRAPHY: CATH118249

## 2019-05-08 LAB — URINALYSIS, ROUTINE W REFLEX MICROSCOPIC
Bilirubin Urine: NEGATIVE
Glucose, UA: NEGATIVE mg/dL
Hgb urine dipstick: NEGATIVE
Ketones, ur: NEGATIVE mg/dL
Leukocytes,Ua: NEGATIVE
Nitrite: NEGATIVE
Protein, ur: NEGATIVE mg/dL
Specific Gravity, Urine: 1.019 (ref 1.005–1.030)
pH: 5 (ref 5.0–8.0)

## 2019-05-08 LAB — BASIC METABOLIC PANEL
Anion gap: 13 (ref 5–15)
BUN: 7 mg/dL (ref 6–20)
CO2: 19 mmol/L — ABNORMAL LOW (ref 22–32)
Calcium: 9.7 mg/dL (ref 8.9–10.3)
Chloride: 105 mmol/L (ref 98–111)
Creatinine, Ser: 0.68 mg/dL (ref 0.44–1.00)
GFR calc Af Amer: >60 mL/min (ref >60)
GFR calc non Af Amer: >60 mL/min (ref >60)
Glucose, Bld: 119 mg/dL — ABNORMAL HIGH (ref 70–99)
Potassium: 4.1 mmol/L (ref 3.5–5.1)
Sodium: 137 mmol/L (ref 135–145)

## 2019-05-08 LAB — CBC
HCT: 43.5 % (ref 36.0–46.0)
Hemoglobin: 13.4 g/dL (ref 12.0–15.0)
MCH: 22.4 pg — ABNORMAL LOW (ref 26.0–34.0)
MCHC: 30.8 g/dL (ref 30.0–36.0)
MCV: 72.7 fL — ABNORMAL LOW (ref 80.0–100.0)
Platelets: 462 10*3/uL — ABNORMAL HIGH (ref 150–400)
RBC: 5.98 MIL/uL — ABNORMAL HIGH (ref 3.87–5.11)
RDW: 16.5 % — ABNORMAL HIGH (ref 11.5–15.5)
WBC: 16.3 10*3/uL — ABNORMAL HIGH (ref 4.0–10.5)
nRBC: 0 % (ref 0.0–0.2)

## 2019-05-08 LAB — POCT ACTIVATED CLOTTING TIME
Activated Clotting Time: 263 seconds
Activated Clotting Time: 301 seconds
Activated Clotting Time: 571 seconds

## 2019-05-08 LAB — I-STAT BETA HCG BLOOD, ED (MC, WL, AP ONLY): I-stat hCG, quantitative: <5 m[IU]/mL (ref <5)

## 2019-05-08 LAB — TROPONIN I (HIGH SENSITIVITY)
Troponin I (High Sensitivity): 58 ng/L — ABNORMAL HIGH (ref <18)
Troponin I (High Sensitivity): 92 ng/L — ABNORMAL HIGH (ref <18)
Troponin I (High Sensitivity): 116 ng/L (ref <18)

## 2019-05-08 LAB — SARS CORONAVIRUS 2 BY RT PCR (HOSPITAL ORDER, PERFORMED IN ~~LOC~~ HOSPITAL LAB): SARS Coronavirus 2: NEGATIVE

## 2019-05-08 SURGERY — LEFT HEART CATH AND CORONARY ANGIOGRAPHY
Anesthesia: LOCAL

## 2019-05-08 MED ORDER — TICAGRELOR 90 MG PO TABS
ORAL_TABLET | ORAL | Status: DC | PRN
  Administered 2019-05-08: 180 mg via ORAL

## 2019-05-08 MED ORDER — SODIUM CHLORIDE 0.9 % IV SOLN
INTRAVENOUS | Status: DC
  Administered 2019-05-08: 13:00:00 via INTRAVENOUS

## 2019-05-08 MED ORDER — SODIUM CHLORIDE 0.9% FLUSH: 3.0000 mL | Freq: Two times a day (BID) | INTRAVENOUS | Status: DC

## 2019-05-08 MED ORDER — ENOXAPARIN SODIUM 40 MG/0.4ML ~~LOC~~ SOLN: 40.0000 mg | SUBCUTANEOUS | Status: DC

## 2019-05-08 MED ORDER — SODIUM CHLORIDE 0.9 % WEIGHT BASED INFUSION
1.0000 mL/kg/h | INTRAVENOUS | Status: DC
  Administered 2019-05-08 (×2): 1 mL/kg/h via INTRAVENOUS

## 2019-05-08 MED ORDER — ATORVASTATIN CALCIUM 80 MG PO TABS
80.0000 mg | ORAL_TABLET | Freq: Every day | ORAL | Status: DC
  Administered 2019-05-08: 20:00:00 80 mg via ORAL
  Filled 2019-05-08 (×2): qty 1

## 2019-05-08 MED ORDER — TICAGRELOR 90 MG PO TABS
90.0000 mg | ORAL_TABLET | Freq: Two times a day (BID) | ORAL | Status: DC
  Administered 2019-05-08 – 2019-05-09 (×2): 90 mg via ORAL
  Filled 2019-05-08 (×2): qty 1

## 2019-05-08 MED ORDER — NITROGLYCERIN 0.4 MG SL SUBL: 0.4000 mg | SUBLINGUAL_TABLET | SUBLINGUAL | Status: DC | PRN

## 2019-05-08 MED ORDER — NITROGLYCERIN IN D5W 200-5 MCG/ML-% IV SOLN
INTRAVENOUS | Status: AC | PRN
  Administered 2019-05-08: 10 ug/min via INTRAVENOUS

## 2019-05-08 MED ORDER — HEPARIN (PORCINE) 25000 UT/250ML-% IV SOLN: 1350.0000 [IU]/h | INTRAVENOUS | Status: DC

## 2019-05-08 MED ORDER — ASPIRIN 81 MG PO CHEW
324.0000 mg | CHEWABLE_TABLET | Freq: Once | ORAL | Status: AC
  Administered 2019-05-08: 324 mg via ORAL
  Filled 2019-05-08: qty 4

## 2019-05-08 MED ORDER — SODIUM CHLORIDE 0.9 % IV SOLN: 250.0000 mL | INTRAVENOUS | Status: DC | PRN

## 2019-05-08 MED ORDER — MIDAZOLAM HCL 2 MG/2ML IJ SOLN
INTRAMUSCULAR | Status: AC
  Filled 2019-05-08: qty 2

## 2019-05-08 MED ORDER — SODIUM CHLORIDE 0.9% FLUSH: 3.0000 mL | Freq: Once | INTRAVENOUS | Status: DC

## 2019-05-08 MED ORDER — HEPARIN SODIUM (PORCINE) 1000 UNIT/ML IJ SOLN
INTRAMUSCULAR | Status: DC | PRN
  Administered 2019-05-08: 4000 [IU] via INTRAVENOUS
  Administered 2019-05-08 (×2): 5000 [IU] via INTRAVENOUS

## 2019-05-08 MED ORDER — HEPARIN (PORCINE) IN NACL 1000-0.9 UT/500ML-% IV SOLN
INTRAVENOUS | Status: DC | PRN
  Administered 2019-05-08 (×2): 500 mL

## 2019-05-08 MED ORDER — ASPIRIN EC 81 MG PO TBEC
81.0000 mg | DELAYED_RELEASE_TABLET | Freq: Every day | ORAL | Status: DC
  Administered 2019-05-09: 81 mg via ORAL
  Filled 2019-05-08: qty 1

## 2019-05-08 MED ORDER — VERAPAMIL HCL 2.5 MG/ML IV SOLN
INTRAVENOUS | Status: AC
  Filled 2019-05-08: qty 2

## 2019-05-08 MED ORDER — HEPARIN (PORCINE) IN NACL 1000-0.9 UT/500ML-% IV SOLN
INTRAVENOUS | Status: AC
  Filled 2019-05-08: qty 1000

## 2019-05-08 MED ORDER — ACETAMINOPHEN 325 MG PO TABS
650.0000 mg | ORAL_TABLET | Freq: Four times a day (QID) | ORAL | Status: DC | PRN
  Administered 2019-05-08: 650 mg via ORAL
  Filled 2019-05-08: qty 2

## 2019-05-08 MED ORDER — NITROGLYCERIN IN D5W 200-5 MCG/ML-% IV SOLN: 0.0000 ug/min | INTRAVENOUS | Status: DC

## 2019-05-08 MED ORDER — HEPARIN BOLUS VIA INFUSION
4000.0000 [IU] | Freq: Once | INTRAVENOUS | Status: AC
  Administered 2019-05-08: 4000 [IU] via INTRAVENOUS
  Filled 2019-05-08: qty 4000

## 2019-05-08 MED ORDER — ASPIRIN 81 MG PO CHEW: 81.0000 mg | CHEWABLE_TABLET | ORAL | Status: DC

## 2019-05-08 MED ORDER — SODIUM CHLORIDE 0.9% FLUSH
3.0000 mL | Freq: Two times a day (BID) | INTRAVENOUS | Status: DC
  Administered 2019-05-09: 3 mL via INTRAVENOUS

## 2019-05-08 MED ORDER — PRENATAL MULTIVITAMIN CH
1.0000 | ORAL_TABLET | Freq: Every day | ORAL | Status: DC
  Filled 2019-05-08: qty 1

## 2019-05-08 MED ORDER — NITROGLYCERIN 1 MG/10 ML FOR IR/CATH LAB
INTRA_ARTERIAL | Status: AC
  Filled 2019-05-08: qty 10

## 2019-05-08 MED ORDER — FENTANYL CITRATE (PF) 100 MCG/2ML IJ SOLN
INTRAMUSCULAR | Status: DC | PRN
  Administered 2019-05-08: 50 ug via INTRAVENOUS
  Administered 2019-05-08 (×2): 25 ug via INTRAVENOUS

## 2019-05-08 MED ORDER — NITROGLYCERIN IN D5W 200-5 MCG/ML-% IV SOLN
INTRAVENOUS | Status: AC
  Filled 2019-05-08: qty 250

## 2019-05-08 MED ORDER — HYDRALAZINE HCL 20 MG/ML IJ SOLN: 10.0000 mg | INTRAMUSCULAR | Status: AC | PRN

## 2019-05-08 MED ORDER — SENNOSIDES-DOCUSATE SODIUM 8.6-50 MG PO TABS: 1.0000 | ORAL_TABLET | Freq: Every evening | ORAL | Status: DC | PRN

## 2019-05-08 MED ORDER — HEPARIN (PORCINE) 25000 UT/250ML-% IV SOLN
1200.0000 [IU]/h | INTRAVENOUS | Status: DC
  Administered 2019-05-08: 1200 [IU]/h via INTRAVENOUS
  Filled 2019-05-08: qty 250

## 2019-05-08 MED ORDER — LIDOCAINE HCL (PF) 1 % IJ SOLN
INTRAMUSCULAR | Status: DC | PRN
  Administered 2019-05-08: 2 mL

## 2019-05-08 MED ORDER — LIDOCAINE HCL (PF) 1 % IJ SOLN
INTRAMUSCULAR | Status: AC
  Filled 2019-05-08: qty 30

## 2019-05-08 MED ORDER — FENTANYL CITRATE (PF) 100 MCG/2ML IJ SOLN
INTRAMUSCULAR | Status: AC
  Filled 2019-05-08: qty 2

## 2019-05-08 MED ORDER — SODIUM CHLORIDE 0.9% FLUSH: 3.0000 mL | INTRAVENOUS | Status: DC | PRN

## 2019-05-08 MED ORDER — MIDAZOLAM HCL 2 MG/2ML IJ SOLN
INTRAMUSCULAR | Status: DC | PRN
  Administered 2019-05-08: 2 mg via INTRAVENOUS
  Administered 2019-05-08: 1 mg via INTRAVENOUS

## 2019-05-08 MED ORDER — IOHEXOL 350 MG/ML SOLN
INTRAVENOUS | Status: DC | PRN
  Administered 2019-05-08: 16:00:00 180 mL via INTRA_ARTERIAL

## 2019-05-08 MED ORDER — PROMETHAZINE HCL 25 MG PO TABS: 12.5000 mg | ORAL_TABLET | Freq: Four times a day (QID) | ORAL | Status: DC | PRN

## 2019-05-08 MED ORDER — TICAGRELOR 90 MG PO TABS
ORAL_TABLET | ORAL | Status: AC
  Filled 2019-05-08: qty 2

## 2019-05-08 MED ORDER — METOPROLOL SUCCINATE ER 50 MG PO TB24
50.0000 mg | ORAL_TABLET | Freq: Every day | ORAL | Status: DC
  Administered 2019-05-08 – 2019-05-09 (×2): 50 mg via ORAL
  Filled 2019-05-08 (×2): qty 1

## 2019-05-08 MED ORDER — VERAPAMIL HCL 2.5 MG/ML IV SOLN
INTRAVENOUS | Status: DC | PRN
  Administered 2019-05-08: 10 mL via INTRA_ARTERIAL

## 2019-05-08 MED ORDER — ONDANSETRON HCL 4 MG/2ML IJ SOLN
4.0000 mg | Freq: Four times a day (QID) | INTRAMUSCULAR | Status: DC | PRN
  Administered 2019-05-08: 4 mg via INTRAVENOUS
  Filled 2019-05-08: qty 2

## 2019-05-08 MED ORDER — LABETALOL HCL 5 MG/ML IV SOLN: 10.0000 mg | INTRAVENOUS | Status: AC | PRN

## 2019-05-08 MED ORDER — ACETAMINOPHEN 650 MG RE SUPP: 650.0000 mg | Freq: Four times a day (QID) | RECTAL | Status: DC | PRN

## 2019-05-08 MED ORDER — NITROGLYCERIN 1 MG/10 ML FOR IR/CATH LAB
INTRA_ARTERIAL | Status: DC | PRN
  Administered 2019-05-08 (×3): 200 ug via INTRACORONARY

## 2019-05-08 SURGICAL SUPPLY — 23 items
BALLN SAPPHIRE 2.5X12 (BALLOONS) ×2
BALLN SAPPHIRE ~~LOC~~ 3.0X8 (BALLOONS) ×1 IMPLANT
BALLN SAPPHIRE ~~LOC~~ 3.25X15 (BALLOONS) ×1 IMPLANT
BALLOON SAPPHIRE 2.5X12 (BALLOONS) IMPLANT
CATH 5FR JL3.5 JR4 ANG PIG MP (CATHETERS) ×1 IMPLANT
CATH LAUNCHER 6FR EBU3.5 (CATHETERS) ×1 IMPLANT
CATH VISTA GUIDE 6FR JR4 (CATHETERS) ×1 IMPLANT
COVER DOME SNAP 22 D (MISCELLANEOUS) ×3 IMPLANT
DEVICE RAD COMP TR BAND LRG (VASCULAR PRODUCTS) ×1 IMPLANT
ELECT DEFIB PAD ADLT CADENCE (PAD) ×1 IMPLANT
GLIDESHEATH SLEND SS 6F .021 (SHEATH) ×1 IMPLANT
GUIDEWIRE INQWIRE 1.5J.035X260 (WIRE) IMPLANT
INQWIRE 1.5J .035X260CM (WIRE) ×2
KIT ENCORE 26 ADVANTAGE (KITS) ×1 IMPLANT
KIT HEART LEFT (KITS) ×2 IMPLANT
PACK CARDIAC CATHETERIZATION (CUSTOM PROCEDURE TRAY) ×2 IMPLANT
SHEATH PROBE COVER 6X72 (BAG) ×1 IMPLANT
STENT SYNERGY DES 3X12 (Permanent Stent) ×1 IMPLANT
STENT SYNERGY DES 3X20 (Permanent Stent) ×1 IMPLANT
TRANSDUCER W/STOPCOCK (MISCELLANEOUS) ×2 IMPLANT
TUBING CIL FLEX 10 FLL-RA (TUBING) ×2 IMPLANT
WIRE ASAHI PROWATER 180CM (WIRE) ×1 IMPLANT
WIRE PT2 MS 185 (WIRE) ×1 IMPLANT

## 2019-05-08 NOTE — ED Notes (Signed)
Consent Signed.

## 2019-05-08 NOTE — ED Triage Notes (Signed)
Per pt for about 3 days she has been having chest pain. Does not radiate. Only sob when it tightens up. Pt said feels like elephant sitting on her chest. HX high blood pressure but not on medications

## 2019-05-08 NOTE — ED Notes (Signed)
Notified MD about Troponin.

## 2019-05-08 NOTE — ED Notes (Signed)
Medical MD @bedside .

## 2019-05-08 NOTE — Consult Note (Addendum)
Cardiology Consultation:   Patient ID: Rudy Domek MRN: [id number]; DOB: 1981/04/04  Admit date: 05/08/2019 Date of Consult: 05/08/2019  Primary Care Provider: Sharion Balloon, FNP Primary Cardiologist: No primary care provider on file. New Primary Electrophysiologist:  None    Patient Profile:   Jeanett Antonopoulos is a 38 y.o. female with a hx of HTN, HLD, anxiety and polycystic ovarian syndrome who is being seen today for the evaluation of chest pain at the request of Dr. Johnney Killian.  History of Present Illness:   Ms. Lucie Leather with no past CAD but FH of premature CAD father with first MI at 13 and died of cardiac arrest at 68 yrs and cerebral aneurysm in her brother,  presented to [facility] ER early AM today with chest pain for 3 days,  SOB when her chest tightens up.    Initial episode 3 days ago she was awakened from sleep with a sense of an elephant sitting on her chest.  She did go back to sleep and went to work with another episode, and at work she felt she would pass out. Went to [facility] at that time but could not wait to be seen.  She went to [facility] early this AM but long wait so came to [facility].  + tobacco and more fatigue than usual.   She does note that about a month ago she was hiking and she had DOE and occ chest pains but continued walking.    EKG:  The EKG was personally reviewed and demonstrates:  SR on first EKG and no ST changes though V1&2 with loss of R wave or very small, second EKG with same but T wave change in III, will check another Telemetry:  Telemetry was personally reviewed and demonstrates:  SR  HS troponin,  58;92;116 Na 137, K+ 4.1, BUN 7 Cr 0.68  Hgb 13.4 WBC 16.3 Plts 462 Neg preg.  Covid neg   Currently pain free but sleepy.    Heart Pathway Score:  HEAR Score: 3  Past Medical History:  Diagnosis Date  . Anxiety   . Cough   . Cyst of brain   . Generalized headaches   . Hyperlipidemia   . Hypertension   . Nasal congestion   . PCOS (polycystic  ovarian syndrome)   . Rectal pain     Past Surgical History:  Procedure Laterality Date  . ANKLE SURGERY  july 2005   screws placed in right ankle   . FRACTURE SURGERY  2005   right ankle  . HEMORRHOID SURGERY    . HEMORRHOID SURGERY  2015  . INCISION AND DRAINAGE BREAST ABSCESS  november 2011   left breast      Home Medications:  Prior to Admission medications   Medication Sig Start Date End Date Taking? Authorizing Provider  clomiPHENE (CLOMID) 50 MG tablet Take 100 mg by mouth See admin instructions. Take 2 tablets once daily for 5 days. (day 3 through 7 of cycle) 04/16/19  Yes [provider]  Prenatal Vit-Fe Fumarate-FA (PRENATAL MULTIVITAMIN) TABS tablet Take 1 tablet by mouth daily.    Yes [provider]  megestrol (MEGACE) 40 MG tablet 3 tablets a day for 5 days, 2 tablets a day for 5 days then 1 tablet daily Patient not taking: Reported on 04/10/2019 03/01/19   Florian Buff, MD  methocarbamol (ROBAXIN) 500 MG tablet Take 1 tablet (500 mg total) by mouth every 8 (eight) hours as needed for muscle spasms. Patient not taking: Reported  on 04/10/2019 07/27/18   Petrucelli, Samantha R, PA-C  naproxen (NAPROSYN) 500 MG tablet Take 1 tablet (500 mg total) by mouth 2 (two) times daily. Patient not taking: Reported on 04/10/2019 07/27/18   Amaryllis Dyke, PA-C    Inpatient Medications: Scheduled Meds: . [START ON 05/09/2019] aspirin EC  81 mg Oral Daily  . enoxaparin (LOVENOX) injection  40 mg Subcutaneous Q24H  . sodium chloride flush  3 mL Intravenous Once   Continuous Infusions:  PRN Meds:   Allergies:   No Known Allergies  Social History:   Social History   Socioeconomic History  . Marital status: Married    Spouse name: Not on file  . Number of children: 3  . Years of education: 44  . Highest education level: Associate degree: academic program  Occupational History  . Occupation: Waitress  Social Needs  . Financial resource strain: Not on file   . Food insecurity    Worry: Not on file    Inability: Not on file  . Transportation needs    Medical: Not on file    Non-medical: Not on file  Tobacco Use  . Smoking status: Current Every Day Smoker    Packs/day: 1.00    Years: 26.00    Pack years: 26.00    Types: Cigarettes  . Smokeless tobacco: Never Used  Substance and Sexual Activity  . Alcohol use: No    Frequency: Never    Comment: quit 2018  . Drug use: No  . Sexual activity: Yes    Birth control/protection: None    Comment: married, 3 kids, vaginal, trying for another  Lifestyle  . Physical activity    Days per week: Not on file    Minutes per session: Not on file  . Stress: Not on file  Relationships  . Social Herbalist on phone: Not on file    Gets together: Not on file    Attends religious service: Not on file    Active member of club or organization: Not on file    Attends meetings of clubs or organizations: Not on file    Relationship status: Not on file  . Intimate partner violence    Fear of current or ex partner: Not on file    Emotionally abused: Not on file    Physically abused: Not on file    Forced sexual activity: Not on file  Other Topics Concern  . Not on file  Social History Narrative   Lives at home with her mother, husband and three daughters.   Right-handed.   Approximately 20 cups caffeine per day (4-5 glasses holding 32oz of tea).    Family History:    Family History  Problem Relation Age of Onset  . Hypertension Mother   . Heart disease Father        Cardiac arrest 2006  . Diabetes Father   . Hyperlipidemia Father   . Stroke Father   . Diabetes Paternal Grandmother   . Cancer Maternal Grandmother        cervical  . Aneurysm Brother      ROS:  Please see the history of present illness.  General:no colds or fevers, no weight changes Skin:no rashes or ulcers HEENT:no blurred vision, no congestion CV:see HPI PUL:see HPI GI:no diarrhea constipation or melena,  no indigestion GU:no hematuria, no dysuria MS:no joint pain, no claudication Neuro:no syncope, no lightheadedness Endo:no diabetes, no thyroid disease GYN: polycystic ovarian syndrome, neg preq  All other ROS reviewed and negative.     Physical Exam/Data:   Vitals:   05/08/19 0815 05/08/19 0830 05/08/19 0950 05/08/19 1000  BP: 123/76 123/79 126/73 108/71  Pulse: 60 (!) 59 63 67  Resp: (!) 25 20 20 17   Temp:      TempSrc:      SpO2: 99% 99% 100% 99%   No intake or output data in the 24 hours ending 05/08/19 1042 Last 3 Weights 10/22/2018 08/01/2018 05/16/2018  Weight (lbs) 224 lb 225 lb 222 lb  Weight (kg) 101.606 kg 102.059 kg 100.699 kg     There is no height or weight on file to calculate BMI.  General:  Well nourished, well developed, in no acute distress HEENT: normal Lymph: no adenopathy Neck: no JVD Endocrine:  No thryomegaly Vascular: No carotid bruits; FA pulses 2+ bilaterally without bruits  Cardiac:  normal S1, S2; RRR; no murmur gallup rub or click Lungs:  clear to auscultation bilaterally, no wheezing, rhonchi or rales  Abd: soft, nontender, no hepatomegaly  Ext: no edema Musculoskeletal:  No deformities, BUE and BLE strength normal and equal Skin: warm and dry  Neuro:  Alert and oriented X 3 MAE follows commands, no focal abnormalities noted Psych:  Normal affect    Relevant CV Studies: Echo ordered  Laboratory Data:  High Sensitivity Troponin:   Recent Labs  Lab 05/08/19 0418 05/08/19 0611 05/08/19 0750  TROPONINIHS 58* 92* 116*     Cardiac EnzymesNo results for input(s): TROPONINI in the last 168 hours. No results for input(s): TROPIPOC in the last 168 hours.  Chemistry Recent Labs  Lab 05/08/19 0418  NA 137  K 4.1  CL 105  CO2 19*  GLUCOSE 119*  BUN 7  CREATININE 0.68  CALCIUM 9.7  GFRNONAA >60  GFRAA >60  ANIONGAP 13    No results for input(s): PROT, ALBUMIN, AST, ALT, ALKPHOS, BILITOT in the last 168 hours. Hematology Recent  Labs  Lab 05/08/19 0418  WBC 16.3*  RBC 5.98*  HGB 13.4  HCT 43.5  MCV 72.7*  MCH 22.4*  MCHC 30.8  RDW 16.5*  PLT 462*   BNPNo results for input(s): BNP, PROBNP in the last 168 hours.  DDimer No results for input(s): DDIMER in the last 168 hours.   Radiology/Studies:  Dg Chest 2 View  Result Date: 05/08/2019 CLINICAL DATA:  Chest pain for the past 3 days EXAM: CHEST - 2 VIEW COMPARISON:  05/06/2019 FINDINGS: Interstitial coarsening. There is no edema, consolidation, effusion, or pneumothorax. Normal heart size and mediastinal contours IMPRESSION: Stable exam.  No evidence of acute disease. Electronically Signed   By: Monte Fantasia M.D.   On: 05/08/2019 04:46    Assessment and Plan:   1. Chest pain, heaviness like elephant sitting on her chest and elevated HS troponin 116.  She has been given ASA 324 and Echo is pending and will need cardiac CTA vs cath.   2. HTN  Elevated here 155/103 and 160/107 now to 129/88  3. Hx HLD check lipids 4. Leukocytosis no fever u/A pending 5. Polycystic ovarian syndrome       For questions or updates, please contact [place] Please consult [email or web address] for contact info under     Signed, Cecilie Kicks, NP  05/08/2019 10:42 AM   ---------------------------------------------------------------------------------------------   History and all data above reviewed.  Patient examined.  I agree with the findings as above.  Aparna Vanderweele is a pleasant 38 year old female with a past medical  history of hypertension, hyperlipidemia, polycystic ovarian syndrome, and family history of premature coronary artery disease who presents today with chest pain and elevated troponin, consistent with NSTEMI.  Patient notes that she woke up 3 days ago with pressure on her chest, went back to sleep went to work and had an additional episode of chest pressure at which point she sought care at [facility].  She has had previous episodes of discomfort most  notably while hiking 1 month ago with dyspnea on exertion and chest discomfort.  Constitutional: No acute distress Eyes: pupils equally round and reactive to light, sclera non-icteric, normal conjunctiva and lids ENMT: normal dentition, moist mucous membranes Cardiovascular: regular rhythm, normal rate, no murmurs. S1 and S2 normal. Radial pulses normal 4/4. No jugular venous distention.  Respiratory: clear to auscultation bilaterally GI : normal bowel sounds, soft and nontender. No distention.   MSK: extremities warm, well perfused. No edema.  NEURO: grossly nonfocal exam, moves all extremities. PSYCH: alert and oriented x 3, normal mood and affect.   All available labs, radiology testing, previous records reviewed. Agree with documented assessment and plan of my colleague as stated above with the following additions or changes:  Active Problems:   NSTEMI (non-ST elevated myocardial infarction) ([place])    Plan: Given her risk factors, family history, smoking status, and presentation of typical angina with evidence of  NSTEMI, we will plan for coronary angiography today.  Will initiate heparin.  She has no known bleeding history and no allergy to contrast dye that she is aware of.  Creatinine is normal 0.68.  We discussed that she may have obstructive coronary artery disease, however given her young age would also want to consider coronary dissection.  ECG shows possible loss of R waves in V1 V2 as well as T wave inversion in lead III, possibly indicating evolving ischemia.  We have discussed the risk, benefits, alternatives of coronary angiography and informed consent was obtained as detailed below.  INFORMED CONSENT:  I have reviewed the risks, indications, and alternatives to cardiac catheterization, possible angioplasty, and stenting with the patient. Risks include but are not limited to bleeding, infection, vascular injury, stroke, myocardial infection, arrhythmia, kidney injury,  radiation-related injury in the case of prolonged fluoroscopy use, emergency cardiac surgery, and death. The patient understands the risks of serious complication is 1-2 in 7681 with diagnostic cardiac cath and 1-2% or less with angioplasty/stenting.   Length of Stay:  LOS: 0 days   Elouise Munroe, MD [facility] 11:58 AM  05/08/2019

## 2019-05-08 NOTE — Progress Notes (Signed)
[place] for heparin Indication: chest pain/ACS  Heparin Dosing Weight: 88.8 kg  Labs: Recent Labs    05/08/19 0418  HGB 13.4  HCT 43.5  PLT 462*  CREATININE 0.68    Assessment: 54 yof presenting with CP, elevated troponin. Pharmacy consulted to dose heparin for ACS. Cath planned today. Not on anticoagulation PTA. CBC wnl. No active bleed issues documented.  Goal of Therapy:  Heparin level 0.3-0.7 units/ml Monitor platelets by anticoagulation protocol: Yes   Plan:  Heparin 4000 unit bolus Start heparin at 1200 units/h 6h heparin level Daily heparin level/CBC Monitor s/sx bleeding F/u post-cath  Elicia Lamp, PharmD, BCPS Clinical Pharmacist 05/08/2019 12:33 PM

## 2019-05-08 NOTE — H&P (Addendum)
Date: 05/08/2019               Patient Name:  Kaitlin Torres MRN: [id number]  DOB: January 03, 1981 Age / Sex: 38 y.o., female   PCP: Sharion Balloon, FNP         Medical Service: Internal Medicine Teaching Service         Attending Physician: Dr. Larey Dresser, MD    First Contact: Dr. Benjamine Mola Pager: [phone]  Second Contact: Dr. Frederico Hamman Pager: [phone]       After Hours (After 5p/  First Contact Pager: [phone]  weekends / holidays): Second Contact Pager: [phone]   Chief Complaint: Chest Pain  History of Present Illness:  Kaitlin Torres is a 38 year-old female with hypertension, hyperlipidemia, brain cysts, family history of early cardiac disease who presented with 3 day history of chest pain. The patient describes the chest pain as an elephant sitting on her, tight/dull in nature, nonradiating, occurring intermittently lasting 5 to 6 minutes, 15/10 in intensity when occurring. The pain is accompanied with dyspnea, palpitations.  She states that the chest pain occasionally feels like a panic attack, but does not feel that the room is closing in on her.  The patient states that her symptoms started on Sunday morning (05/05/2019) when she woke up with chest pain that was severe in nature.  She said over the night the pain got better.  She did not use any medication.  Subsequently the next day due to her symptoms being alleviated she went to work around 3 PM and a few minutes into work she noticed that she had a pre-syncopal type episode where she was hot/sweating, had blurry vision, decreased hearing, and felt that she was going to pass out.  EMS was called and she was taken to [facility] ED.  Records from [facility] rocking him are not available on EMR but according to the patient she had a chest x-ray and CT head that were unremarkable.  The patient was recommended to stay overnight for observation, but due to family commitments (her children) she had to leave.  Patient once again started  noticing chest pain last night and so she went [facility] around midnight but due to long wait time she decided to come to [facility] instead.  Of note, the patient states that her dog passed away 2 days ago and she has been stressed regarding this and feels that this may have been causing her some panic attacks as well.  At [facility] ED, the patient was noted to be afebrile, have normal pulse, slightly tachypneic 18-25,  bp 130-160/80-100, saturating on room air.  Was given 1 dose of aspirin 324.  Meds:  Current Meds  Medication Sig   clomiPHENE (CLOMID) 50 MG tablet Take 100 mg by mouth See admin instructions. Take 2 tablets once daily for 5 days. (day 3 through 7 of cycle)   Prenatal Vit-Fe Fumarate-FA (PRENATAL MULTIVITAMIN) TABS tablet Take 1 tablet by mouth daily.     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   Physical Exam: Blood pressure 108/71, pulse 67, temperature 98.6 F (37 C), temperature source Oral, resp. rate 17, SpO2 99 %.  Physical Exam  Constitutional: She is oriented to person, place, and time. She appears well-developed and well-nourished. No distress.  HENT:  Head: Normocephalic and atraumatic.  Eyes: Conjunctivae are normal.  No scleral icterus.  Cardiovascular: Normal rate, regular rhythm, normal heart sounds and intact distal pulses.  No murmur heard. To chest wall tenderness  Respiratory: Effort normal and breath sounds normal. No respiratory distress. She has no wheezes.  GI: Soft. Bowel sounds are normal. She exhibits no distension. There is no abdominal tenderness.  Musculoskeletal:        General: No edema (no pitting).  Neurological: She is alert and oriented to person, place, and time.  Skin: No rash noted. She is not diaphoretic. No erythema.  Several tattoos over lower abdomen and back  Psychiatric: She has a normal mood and affect. Her behavior is normal. Judgment and thought content normal.   Labs:  WBC 16, MCV 72, RDW 16.5 Troponin 58>92>116  EKG: personally reviewed my interpretation is normal sinus rhythm with T wave inversion in lead III.  CXR: personally reviewed my interpretation is no consolidation, effusion.  Assessment & Plan by Problem: Active Problems:   NSTEMI (non-ST elevated myocardial infarction) [facility])  Kaitlin Torres is a 38 y.o f with htn, hld, tobacco use disorder who presents with a 3 day history of intermittent typical chest pain, troponin elevation, and t wave inversion in lead III on ekg. Treating patient for NSTEMI.  NSTEMI Patient with elevated troponin 58>92>116.  Initial EKG showed normal sinus rhythm without any st or t wave changes.  Subsequent EKG showed a new T wave inversion in lead III.  Heart score of 5 (risk factors, initial troponin, degree of suspicion).   The patient's chest pain appears to be typical in nature and therefore cardiac causes of chest pain should be ruled out first.  The patient is saturating well on room air and does not have any significant risk factors for a PE. If more serious causes of chest pain are ruled out, panic disorder can be considered given recent stressors (dog's death).   Will order echo to check for wall motion abnormalities,  repeat EKG today and in a.m. 7/2. The patient may benefit from coronary CT versus a possible left heart cath given her risk factors.  -cardiac monitoring  -TTE ordered to evaluate for wall motion abnormalities -Given asprin 324mg  in ED, continue aspirin 81mg  daily  -EKG AM 7/2 -Nitroglycerin 0.4mg  sl q13min prn  -Lipid panel for 7/2 am  -A1C for 7/2 am -Appreciate cardiology recommendations  Leukocytosis WBC = 16.  Patient is currently afebrile and does not have any signs or symptoms of an infection currently. No consolidation seen on chest xray. Patient denies any dysuria or burning when urinating. Leukocytosis may possibly be stress induced.  -Follow-up CBC in a.m. -Monitor fever curve -Ordered UA  Hypertension  The patient's blood pressure has ranged 130-160/70-100 since admission.  Patient states that she does not take any antihypertensive medication at home.  -We will monitor  patient's blood pressure and evaluate need for antihypertensive agent  Hyperlipidemia  -Ordered a.m. lipid panel  Dispo: Admit patient to Observation with expected length of stay less than 2 midnights.  SignedLars Mage, MD 05/08/2019, 10:52 AM  Pager: [phone]

## 2019-05-08 NOTE — H&P (View-Only) (Signed)
Cardiology Consultation:   Patient ID: Kaitlin Torres MRN: [id number]; DOB: [birth year]  Admit date: 05/08/2019 Date of Consult: 05/08/2019  Primary Care Provider: Sharion Balloon, FNP Primary Cardiologist: No primary care provider on file. New Primary Electrophysiologist:  None    Patient Profile:   Kaitlin Torres is a 38 y.o. female with a hx of HTN, HLD, anxiety and polycystic ovarian syndrome who is being seen today for the evaluation of chest pain at the request of Dr. Johnney Killian.  History of Present Illness:   Kaitlin Torres with no past CAD but FH of premature CAD father with first MI at 19 and died of cardiac arrest at 48 yrs and cerebral aneurysm in her brother,  presented to [facility] ER early AM today with chest pain for 3 days,  SOB when her chest tightens up.    Initial episode 3 days ago she was awakened from sleep with a sense of an elephant sitting on her chest.  She did go back to sleep and went to work with another episode, and at work she felt she would pass out. Went to [facility] at that time but could not wait to be seen.  She went to [facility] early this AM but long wait so came to [facility].  + tobacco and more fatigue than usual.   She does note that about a month ago she was hiking and she had DOE and occ chest pains but continued walking.    EKG:  The EKG was personally reviewed and demonstrates:  SR on first EKG and no ST changes though V1&2 with loss of R wave or very small, second EKG with same but T wave change in III, will check another Telemetry:  Telemetry was personally reviewed and demonstrates:  SR  HS troponin,  58;92;116 Na 137, K+ 4.1, BUN 7 Cr 0.68  Hgb 13.4 WBC 16.3 Plts 462 Neg preg.  Covid neg   Currently pain free but sleepy.    Heart Pathway Score:  HEAR Score: 3  Past Medical History:  Diagnosis Date  . Anxiety   . Cough   . Cyst of brain   . Generalized headaches   . Hyperlipidemia   . Hypertension   . Nasal congestion   . PCOS (polycystic  ovarian syndrome)   . Rectal pain     Past Surgical History:  Procedure Laterality Date  . ANKLE SURGERY  july 2005   screws placed in right ankle   . FRACTURE SURGERY  2005   right ankle  . HEMORRHOID SURGERY    . HEMORRHOID SURGERY  2015  . INCISION AND DRAINAGE BREAST ABSCESS  november 2011   left breast      Home Medications:  Prior to Admission medications   Medication Sig Start Date End Date Taking? Authorizing Provider  clomiPHENE (CLOMID) 50 MG tablet Take 100 mg by mouth See admin instructions. Take 2 tablets once daily for 5 days. (day 3 through 7 of cycle) 04/16/19  Yes [provider]  Prenatal Vit-Fe Fumarate-FA (PRENATAL MULTIVITAMIN) TABS tablet Take 1 tablet by mouth daily.    Yes [provider]  megestrol (MEGACE) 40 MG tablet 3 tablets a day for 5 days, 2 tablets a day for 5 days then 1 tablet daily Patient not taking: Reported on 04/10/2019 03/01/19   Florian Buff, MD  methocarbamol (ROBAXIN) 500 MG tablet Take 1 tablet (500 mg total) by mouth every 8 (eight) hours as needed for muscle spasms. Patient not taking: Reported  on 04/10/2019 07/27/18   Petrucelli, Samantha R, PA-C  naproxen (NAPROSYN) 500 MG tablet Take 1 tablet (500 mg total) by mouth 2 (two) times daily. Patient not taking: Reported on 04/10/2019 07/27/18   Amaryllis Dyke, PA-C    Inpatient Medications: Scheduled Meds: . [START ON 05/09/2019] aspirin EC  81 mg Oral Daily  . enoxaparin (LOVENOX) injection  40 mg Subcutaneous Q24H  . sodium chloride flush  3 mL Intravenous Once   Continuous Infusions:  PRN Meds:   Allergies:   No Known Allergies  Social History:   Social History   Socioeconomic History  . Marital status: Married    Spouse name: Not on file  . Number of children: 3  . Years of education: 59  . Highest education level: Associate degree: academic program  Occupational History  . Occupation: Waitress  Social Needs  . Financial resource strain: Not on file   . Food insecurity    Worry: Not on file    Inability: Not on file  . Transportation needs    Medical: Not on file    Non-medical: Not on file  Tobacco Use  . Smoking status: Current Every Day Smoker    Packs/day: 1.00    Years: 26.00    Pack years: 26.00    Types: Cigarettes  . Smokeless tobacco: Never Used  Substance and Sexual Activity  . Alcohol use: No    Frequency: Never    Comment: quit 2018  . Drug use: No  . Sexual activity: Yes    Birth control/protection: None    Comment: married, 3 kids, vaginal, trying for another  Lifestyle  . Physical activity    Days per week: Not on file    Minutes per session: Not on file  . Stress: Not on file  Relationships  . Social Herbalist on phone: Not on file    Gets together: Not on file    Attends religious service: Not on file    Active member of club or organization: Not on file    Attends meetings of clubs or organizations: Not on file    Relationship status: Not on file  . Intimate partner violence    Fear of current or ex partner: Not on file    Emotionally abused: Not on file    Physically abused: Not on file    Forced sexual activity: Not on file  Other Topics Concern  . Not on file  Social History Narrative   Lives at home with her mother, husband and three daughters.   Right-handed.   Approximately 20 cups caffeine per day (4-5 glasses holding 32oz of tea).    Family History:    Family History  Problem Relation Age of Onset  . Hypertension Mother   . Heart disease Father        Cardiac arrest 2006  . Diabetes Father   . Hyperlipidemia Father   . Stroke Father   . Diabetes Paternal Grandmother   . Cancer Maternal Grandmother        cervical  . Aneurysm Brother      ROS:  Please see the history of present illness.  General:no colds or fevers, no weight changes Skin:no rashes or ulcers HEENT:no blurred vision, no congestion CV:see HPI PUL:see HPI GI:no diarrhea constipation or melena,  no indigestion GU:no hematuria, no dysuria MS:no joint pain, no claudication Neuro:no syncope, no lightheadedness Endo:no diabetes, no thyroid disease GYN: polycystic ovarian syndrome, neg preq  All other ROS reviewed and negative.     Physical Exam/Data:   Vitals:   05/08/19 0815 05/08/19 0830 05/08/19 0950 05/08/19 1000  BP: 123/76 123/79 126/73 108/71  Pulse: 60 (!) 59 63 67  Resp: (!) 25 20 20 17   Temp:      TempSrc:      SpO2: 99% 99% 100% 99%   No intake or output data in the 24 hours ending 05/08/19 1042 Last 3 Weights 10/22/2018 08/01/2018 05/16/2018  Weight (lbs) 224 lb 225 lb 222 lb  Weight (kg) 101.606 kg 102.059 kg 100.699 kg     There is no height or weight on file to calculate BMI.  General:  Well nourished, well developed, in no acute distress HEENT: normal Lymph: no adenopathy Neck: no JVD Endocrine:  No thryomegaly Vascular: No carotid bruits; FA pulses 2+ bilaterally without bruits  Cardiac:  normal S1, S2; RRR; no murmur gallup rub or click Lungs:  clear to auscultation bilaterally, no wheezing, rhonchi or rales  Abd: soft, nontender, no hepatomegaly  Ext: no edema Musculoskeletal:  No deformities, BUE and BLE strength normal and equal Skin: warm and dry  Neuro:  Alert and oriented X 3 MAE follows commands, no focal abnormalities noted Psych:  Normal affect    Relevant CV Studies: Echo ordered  Laboratory Data:  High Sensitivity Troponin:   Recent Labs  Lab 05/08/19 0418 05/08/19 0611 05/08/19 0750  TROPONINIHS 58* 92* 116*     Cardiac EnzymesNo results for input(s): TROPONINI in the last 168 hours. No results for input(s): TROPIPOC in the last 168 hours.  Chemistry Recent Labs  Lab 05/08/19 0418  NA 137  K 4.1  CL 105  CO2 19*  GLUCOSE 119*  BUN 7  CREATININE 0.68  CALCIUM 9.7  GFRNONAA >60  GFRAA >60  ANIONGAP 13    No results for input(s): PROT, ALBUMIN, AST, ALT, ALKPHOS, BILITOT in the last 168 hours. Hematology Recent  Labs  Lab 05/08/19 0418  WBC 16.3*  RBC 5.98*  HGB 13.4  HCT 43.5  MCV 72.7*  MCH 22.4*  MCHC 30.8  RDW 16.5*  PLT 462*   BNPNo results for input(s): BNP, PROBNP in the last 168 hours.  DDimer No results for input(s): DDIMER in the last 168 hours.   Radiology/Studies:  Dg Chest 2 View  Result Date: 05/08/2019 CLINICAL DATA:  Chest pain for the past 3 days EXAM: CHEST - 2 VIEW COMPARISON:  05/06/2019 FINDINGS: Interstitial coarsening. There is no edema, consolidation, effusion, or pneumothorax. Normal heart size and mediastinal contours IMPRESSION: Stable exam.  No evidence of acute disease. Electronically Signed   By: Monte Fantasia M.D.   On: 05/08/2019 04:46    Assessment and Plan:   1. Chest pain, heaviness like elephant sitting on her chest and elevated HS troponin 116.  She has been given ASA 324 and Echo is pending and will need cardiac CTA vs cath.   2. HTN  Elevated here 155/103 and 160/107 now to 129/88  3. Hx HLD check lipids 4. Leukocytosis no fever u/A pending 5. Polycystic ovarian syndrome       For questions or updates, please contact [facility] Please consult [email or web address] for contact info under     Signed, Cecilie Kicks, NP  05/08/2019 10:42 AM   ---------------------------------------------------------------------------------------------   History and all data above reviewed.  Patient examined.  I agree with the findings as above.  Kaitlin Torres is a pleasant 38 year old female with a past medical  history of hypertension, hyperlipidemia, polycystic ovarian syndrome, and family history of premature coronary artery disease who presents today with chest pain and elevated troponin, consistent with NSTEMI.  Patient notes that she woke up 3 days ago with pressure on her chest, went back to sleep went to work and had an additional episode of chest pressure at which point she sought care at [facility].  She has had previous episodes of discomfort most  notably while hiking 1 month ago with dyspnea on exertion and chest discomfort.  Constitutional: No acute distress Eyes: pupils equally round and reactive to light, sclera non-icteric, normal conjunctiva and lids ENMT: normal dentition, moist mucous membranes Cardiovascular: regular rhythm, normal rate, no murmurs. S1 and S2 normal. Radial pulses normal 4/4. No jugular venous distention.  Respiratory: clear to auscultation bilaterally GI : normal bowel sounds, soft and nontender. No distention.   MSK: extremities warm, well perfused. No edema.  NEURO: grossly nonfocal exam, moves all extremities. PSYCH: alert and oriented x 3, normal mood and affect.   All available labs, radiology testing, previous records reviewed. Agree with documented assessment and plan of my colleague as stated above with the following additions or changes:  Active Problems:   NSTEMI (non-ST elevated myocardial infarction) ([place])    Plan: Given her risk factors, family history, smoking status, and presentation of typical angina with evidence of  NSTEMI, we will plan for coronary angiography today.  Will initiate heparin.  She has no known bleeding history and no allergy to contrast dye that she is aware of.  Creatinine is normal 0.68.  We discussed that she may have obstructive coronary artery disease, however given her young age would also want to consider coronary dissection.  ECG shows possible loss of R waves in V1 V2 as well as T wave inversion in lead III, possibly indicating evolving ischemia.  We have discussed the risk, benefits, alternatives of coronary angiography and informed consent was obtained as detailed below.  INFORMED CONSENT:  I have reviewed the risks, indications, and alternatives to cardiac catheterization, possible angioplasty, and stenting with the patient. Risks include but are not limited to bleeding, infection, vascular injury, stroke, myocardial infection, arrhythmia, kidney injury,  radiation-related injury in the case of prolonged fluoroscopy use, emergency cardiac surgery, and death. The patient understands the risks of serious complication is 1-2 in 6659 with diagnostic cardiac cath and 1-2% or less with angioplasty/stenting.   Length of Stay:  LOS: 0 days   Kaitlin Munroe, MD [facility] 11:58 AM  05/08/2019

## 2019-05-08 NOTE — ED Provider Notes (Addendum)
Needs admission for trop elevation. CP before arrival, now resolved. Positive risk factors. Physical Exam  BP (!) 155/103 (BP Location: Right Arm)   Pulse 87   Temp 98.6 F (37 C) (Oral)   Resp 18   SpO2 100%   Physical Exam  ED Course/Procedures     Procedures  MDM   08: 22 cardiology is consulted for evaluation.  Recommendation for medical admission Consult 09: 28 teaching service for admission      Charlesetta Shanks, MD 05/08/19 Matthias Hughs, MD 05/08/19 1308    Charlesetta Shanks, MD 05/08/19 [phone]

## 2019-05-08 NOTE — Progress Notes (Signed)
Attempted echo.  Patient will be going to cath lab any minute.  Holding off on echo per convo with nurse

## 2019-05-08 NOTE — ED Notes (Signed)
Cards at beside.

## 2019-05-08 NOTE — Interval H&P Note (Signed)
History and Physical Interval Note:  05/08/2019 1:59 PM  Kaitlin Torres  has presented today for surgery, with the diagnosis of nonstemi.  The various methods of treatment have been discussed with the patient and family. After consideration of risks, benefits and other options for treatment, the patient has consented to  Procedure(s): LEFT HEART CATH AND CORONARY ANGIOGRAPHY (N/A) as a surgical intervention.  The patient's history has been reviewed, patient examined, no change in status, stable for surgery.  I have reviewed the patient's chart and labs.  Questions were answered to the patient's satisfaction.    Cath Lab Visit (complete for each Cath Lab visit)  Clinical Evaluation Leading to the Procedure:   ACS: Yes.    Non-ACS:    Anginal Classification: CCS IV  Anti-ischemic medical therapy: No Therapy  Non-Invasive Test Results: No non-invasive testing performed  Prior CABG: No previous CABG        [name] 05/08/2019 2:00 PM

## 2019-05-08 NOTE — ED Provider Notes (Signed)
[place] EMERGENCY DEPARTMENT Provider Note   CSN: [id number] Arrival date & time: 05/08/19  0403    History   Chief Complaint Chief Complaint  Patient presents with  . Chest Pain    HPI Kaitlin Torres is a 38 y.o. female.   The history is provided by the patient.  Chest Pain She has history of hypertension and hyperlipidemia and comes in because of episodes of chest pain.  Initial episode was 3 days ago when she was awakened from sleep with a sense of an elephant sitting on her chest.  There is associated dyspnea.  She eventually was able to get back to sleep, but had recurrence of similar discomfort when she was at work.  She also had a near syncopal episode while at work.  She was seen at [facility] emergency department where it was recommended that she stay for further testing, but she chose to leave instead.  She has continued to have episodes of this heavy feeling.  They are not definitely exertional but she has noted that she is more fatigued than normal.  She is a cigarette smoker.  She denies history of diabetes.  There is a family history of premature coronary atherosclerosis in that both parents had heart disease onset in their early 38s early 52s.  Past Medical History:  Diagnosis Date  . Anxiety   . Cough   . Cyst of brain   . Generalized headaches   . Hyperlipidemia   . Hypertension   . Nasal congestion   . PCOS (polycystic ovarian syndrome)   . Rectal pain     Patient Active Problem List   Diagnosis Date Noted  . Tobacco abuse 04/05/2018  . Obesity (BMI 30.0-34.9) 04/05/2018  . Abnormal CT scan, head 01/16/2018  . Pap smear for cervical cancer screening 11/15/2017  . Irregular periods 11/14/2017  . Hemorrhoids, internal, with prolapse 08/05/2011    Past Surgical History:  Procedure Laterality Date  . ANKLE SURGERY  july 2005   screws placed in right ankle   . FRACTURE SURGERY  2005   right ankle  . HEMORRHOID SURGERY    .  HEMORRHOID SURGERY  2015  . INCISION AND DRAINAGE BREAST ABSCESS  november 2011   left breast      OB History    Gravida  3   Para  3   Term  3   Preterm      AB      Living  3     SAB      TAB      Ectopic      Multiple      Live Births  3            Home Medications    Prior to Admission medications   Medication Sig Start Date End Date Taking? Authorizing Provider  Cholecalciferol (VITAMIN D-3) 5000 units TABS Take 5,000 Units by mouth daily.    [provider]  megestrol (MEGACE) 40 MG tablet 3 tablets a day for 5 days, 2 tablets a day for 5 days then 1 tablet daily Patient not taking: Reported on 04/10/2019 03/01/19   Florian Buff, MD  methocarbamol (ROBAXIN) 500 MG tablet Take 1 tablet (500 mg total) by mouth every 8 (eight) hours as needed for muscle spasms. Patient not taking: Reported on 04/10/2019 07/27/18   Petrucelli, Aldona Bar R, PA-C  naproxen (NAPROSYN) 500 MG tablet Take 1 tablet (500 mg total) by mouth 2 (  two) times daily. Patient not taking: Reported on 04/10/2019 07/27/18   Petrucelli, Glynda Jaeger, PA-C  Naproxen Sodium (ALEVE) 220 MG CAPS Take by mouth as needed.    [provider]    Family History Family History  Problem Relation Age of Onset  . Hypertension Mother   . Heart disease Father        Cardiac arrest 2006  . Diabetes Father   . Hyperlipidemia Father   . Stroke Father   . Diabetes Paternal Grandmother   . Cancer Maternal Grandmother        cervical  . Aneurysm Brother     Social History Social History   Tobacco Use  . Smoking status: Current Every Day Smoker    Packs/day: 1.00    Years: 26.00    Pack years: 26.00    Types: Cigarettes  . Smokeless tobacco: Never Used  Substance Use Topics  . Alcohol use: No    Frequency: Never    Comment: quit 2018  . Drug use: No     Allergies   Patient has no known allergies.   Review of Systems Review of Systems  Cardiovascular: Positive for chest pain.   All other systems reviewed and are negative.    Physical Exam Updated Vital Signs BP (!) 155/103 (BP Location: Right Arm)   Pulse 87   Temp 98.6 F (37 C) (Oral)   Resp 18   SpO2 100%   Physical Exam Vitals signs and nursing note reviewed.    38 year old female, resting comfortably and in no acute distress. Vital signs are significant for elevated blood pressure. Oxygen saturation is 100%, which is normal. Head is normocephalic and atraumatic. PERRLA, EOMI. Oropharynx is clear. Neck is nontender and supple without adenopathy or JVD. Back is nontender and there is no CVA tenderness. Lungs are clear without rales, wheezes, or rhonchi. Chest is nontender. Heart has regular rate and rhythm without murmur. Abdomen is soft, flat, nontender without masses or hepatosplenomegaly and peristalsis is normoactive. Extremities have no cyanosis or edema, full range of motion is present. Skin is warm and dry without rash. Neurologic: Mental status is normal, cranial nerves are intact, there are no motor or sensory deficits.  ED Treatments / Results  Labs (all labs ordered are listed, but only abnormal results are displayed) Labs Reviewed  BASIC METABOLIC PANEL - Abnormal; Notable for the following components:      Result Value   CO2 19 (*)    Glucose, Bld 119 (*)    All other components within normal limits  CBC - Abnormal; Notable for the following components:   WBC 16.3 (*)    RBC 5.98 (*)    MCV 72.7 (*)    MCH 22.4 (*)    RDW 16.5 (*)    Platelets 462 (*)    All other components within normal limits  TROPONIN I (HIGH SENSITIVITY) - Abnormal; Notable for the following components:   Troponin I (High Sensitivity) 58 (*)    All other components within normal limits  TROPONIN I (HIGH SENSITIVITY) - Abnormal; Notable for the following components:   Troponin I (High Sensitivity) 92 (*)    All other components within normal limits  SARS CORONAVIRUS 2 (PERFORMED IN [facility] LAB)  TROPONIN I (HIGH SENSITIVITY)  I-STAT BETA HCG BLOOD, ED ([facility], [facility], [facility] ONLY)    EKG EKG Interpretation  Date/Time:  Wednesday May 08 2019 07:18:28 EDT Ventricular Rate:  59 PR Interval:  QRS Duration: 96 QT Interval:  426 QTC Calculation: 422 R Axis:   53 Text Interpretation:  Sinus rhythm Normal ECG No old tracing to compare Confirmed by Delora Fuel ([id number]) on 05/08/2019 7:24:48 AM   Radiology Dg Chest 2 View  Result Date: 05/08/2019 CLINICAL DATA:  Chest pain for the past 3 days EXAM: CHEST - 2 VIEW COMPARISON:  05/06/2019 FINDINGS: Interstitial coarsening. There is no edema, consolidation, effusion, or pneumothorax. Normal heart size and mediastinal contours IMPRESSION: Stable exam.  No evidence of acute disease. Electronically Signed   By: Monte Fantasia M.D.   On: 05/08/2019 04:46    Procedures Procedures  Medications Ordered in ED Medications  sodium chloride flush (NS) 0.9 % injection 3 mL (has no administration in time range)  aspirin chewable tablet 324 mg (has no administration in time range)     Initial Impression / Assessment and Plan / ED Course  I have reviewed the triage vital signs and the nursing notes.  Pertinent labs & imaging results that were available during my care of the patient were reviewed by me and considered in my medical decision making (see chart for details).  Chest discomfort which is certainly worrisome for coronary disease.  She does have significant risk factors for coronary artery disease.  She is given aspirin.  Initial troponin is in the indeterminate range, repeat troponin pending.  Chest x-ray is unremarkable.  Repeat troponin has risen although she remains pain-free.  ECG shows no acute changes.  However, with rising troponin she will need to be admitted.  Final Clinical Impressions(s) / ED Diagnoses   Final diagnoses:  Nonspecific chest pain    ED Discharge Orders    None       Delora Fuel, MD 62/83/66  0730

## 2019-05-09 ENCOUNTER — Observation Stay (HOSPITAL_COMMUNITY): Payer: Medicaid Other

## 2019-05-09 ENCOUNTER — Other Ambulatory Visit: Payer: Self-pay | Admitting: Internal Medicine

## 2019-05-09 ENCOUNTER — Encounter (HOSPITAL_COMMUNITY): Payer: Self-pay | Admitting: Cardiology

## 2019-05-09 DIAGNOSIS — R079 Chest pain, unspecified: Secondary | ICD-10-CM

## 2019-05-09 DIAGNOSIS — Z8249 Family history of ischemic heart disease and other diseases of the circulatory system: Secondary | ICD-10-CM | POA: Diagnosis not present

## 2019-05-09 DIAGNOSIS — Z20828 Contact with and (suspected) exposure to other viral communicable diseases: Secondary | ICD-10-CM | POA: Diagnosis not present

## 2019-05-09 DIAGNOSIS — Z8241 Family history of sudden cardiac death: Secondary | ICD-10-CM | POA: Diagnosis not present

## 2019-05-09 DIAGNOSIS — E7800 Pure hypercholesterolemia, unspecified: Secondary | ICD-10-CM | POA: Diagnosis present

## 2019-05-09 DIAGNOSIS — Z7982 Long term (current) use of aspirin: Secondary | ICD-10-CM | POA: Diagnosis not present

## 2019-05-09 DIAGNOSIS — I119 Hypertensive heart disease without heart failure: Secondary | ICD-10-CM | POA: Diagnosis present

## 2019-05-09 DIAGNOSIS — F419 Anxiety disorder, unspecified: Secondary | ICD-10-CM | POA: Diagnosis present

## 2019-05-09 DIAGNOSIS — I472 Ventricular tachycardia: Secondary | ICD-10-CM | POA: Diagnosis present

## 2019-05-09 DIAGNOSIS — Z955 Presence of coronary angioplasty implant and graft: Secondary | ICD-10-CM

## 2019-05-09 DIAGNOSIS — Z1159 Encounter for screening for other viral diseases: Secondary | ICD-10-CM | POA: Diagnosis not present

## 2019-05-09 DIAGNOSIS — I214 Non-ST elevation (NSTEMI) myocardial infarction: Secondary | ICD-10-CM | POA: Diagnosis not present

## 2019-05-09 DIAGNOSIS — Z720 Tobacco use: Secondary | ICD-10-CM

## 2019-05-09 DIAGNOSIS — F17210 Nicotine dependence, cigarettes, uncomplicated: Secondary | ICD-10-CM | POA: Diagnosis present

## 2019-05-09 DIAGNOSIS — H9190 Unspecified hearing loss, unspecified ear: Secondary | ICD-10-CM | POA: Diagnosis present

## 2019-05-09 DIAGNOSIS — R55 Syncope and collapse: Secondary | ICD-10-CM | POA: Diagnosis not present

## 2019-05-09 DIAGNOSIS — E785 Hyperlipidemia, unspecified: Secondary | ICD-10-CM | POA: Diagnosis present

## 2019-05-09 DIAGNOSIS — I25119 Atherosclerotic heart disease of native coronary artery with unspecified angina pectoris: Secondary | ICD-10-CM | POA: Diagnosis present

## 2019-05-09 DIAGNOSIS — Z79899 Other long term (current) drug therapy: Secondary | ICD-10-CM | POA: Diagnosis not present

## 2019-05-09 DIAGNOSIS — Z823 Family history of stroke: Secondary | ICD-10-CM | POA: Diagnosis not present

## 2019-05-09 DIAGNOSIS — E282 Polycystic ovarian syndrome: Secondary | ICD-10-CM | POA: Diagnosis present

## 2019-05-09 DIAGNOSIS — F410 Panic disorder [episodic paroxysmal anxiety] without agoraphobia: Secondary | ICD-10-CM | POA: Diagnosis present

## 2019-05-09 DIAGNOSIS — Z833 Family history of diabetes mellitus: Secondary | ICD-10-CM | POA: Diagnosis not present

## 2019-05-09 DIAGNOSIS — Z8041 Family history of malignant neoplasm of ovary: Secondary | ICD-10-CM | POA: Diagnosis not present

## 2019-05-09 DIAGNOSIS — Z8349 Family history of other endocrine, nutritional and metabolic diseases: Secondary | ICD-10-CM | POA: Diagnosis not present

## 2019-05-09 DIAGNOSIS — R0600 Dyspnea, unspecified: Secondary | ICD-10-CM | POA: Diagnosis not present

## 2019-05-09 LAB — LIPID PANEL
Cholesterol: 240 mg/dL — ABNORMAL HIGH (ref 0–200)
HDL: 24 mg/dL — ABNORMAL LOW (ref >40)
LDL Cholesterol: 169 mg/dL — ABNORMAL HIGH (ref 0–99)
Total CHOL/HDL Ratio: 10 RATIO
Triglycerides: 233 mg/dL — ABNORMAL HIGH (ref <150)
VLDL: 47 mg/dL — ABNORMAL HIGH (ref 0–40)

## 2019-05-09 LAB — CBC
HCT: 37.8 % (ref 36.0–46.0)
Hemoglobin: 11.5 g/dL — ABNORMAL LOW (ref 12.0–15.0)
MCH: 22.2 pg — ABNORMAL LOW (ref 26.0–34.0)
MCHC: 30.4 g/dL (ref 30.0–36.0)
MCV: 73 fL — ABNORMAL LOW (ref 80.0–100.0)
Platelets: 395 10*3/uL (ref 150–400)
RBC: 5.18 MIL/uL — ABNORMAL HIGH (ref 3.87–5.11)
RDW: 16.2 % — ABNORMAL HIGH (ref 11.5–15.5)
WBC: 13.1 10*3/uL — ABNORMAL HIGH (ref 4.0–10.5)
nRBC: 0 % (ref 0.0–0.2)

## 2019-05-09 LAB — BASIC METABOLIC PANEL
Anion gap: 7 (ref 5–15)
BUN: 9 mg/dL (ref 6–20)
CO2: 23 mmol/L (ref 22–32)
Calcium: 8.9 mg/dL (ref 8.9–10.3)
Chloride: 106 mmol/L (ref 98–111)
Creatinine, Ser: 0.76 mg/dL (ref 0.44–1.00)
GFR calc Af Amer: >60 mL/min (ref >60)
GFR calc non Af Amer: >60 mL/min (ref >60)
Glucose, Bld: 118 mg/dL — ABNORMAL HIGH (ref 70–99)
Potassium: 3.9 mmol/L (ref 3.5–5.1)
Sodium: 136 mmol/L (ref 135–145)

## 2019-05-09 LAB — HEMOGLOBIN A1C
Hgb A1c MFr Bld: 5.4 % (ref 4.8–5.6)
Mean Plasma Glucose: 108.28 mg/dL

## 2019-05-09 LAB — MAGNESIUM: Magnesium: 1.9 mg/dL (ref 1.7–2.4)

## 2019-05-09 LAB — IRON AND TIBC
Iron: 17 ug/dL — ABNORMAL LOW (ref 28–170)
Saturation Ratios: 3 % — ABNORMAL LOW (ref 10.4–31.8)
TIBC: 512 ug/dL — ABNORMAL HIGH (ref 250–450)
UIBC: 495 ug/dL

## 2019-05-09 LAB — HIV ANTIBODY (ROUTINE TESTING W REFLEX): HIV Screen 4th Generation wRfx: NONREACTIVE

## 2019-05-09 LAB — FERRITIN: Ferritin: 14 ng/mL (ref 11–307)

## 2019-05-09 LAB — ECHOCARDIOGRAM COMPLETE
Height: 69 in
Weight: 3697.6 oz

## 2019-05-09 MED ORDER — TICAGRELOR 90 MG PO TABS: 90.0000 mg | ORAL_TABLET | Freq: Two times a day (BID) | ORAL | 0 refills | Status: DC

## 2019-05-09 MED ORDER — ASPIRIN 81 MG PO TBEC: 81.0000 mg | DELAYED_RELEASE_TABLET | Freq: Every day | ORAL | 0 refills | Status: DC

## 2019-05-09 MED ORDER — LOSARTAN POTASSIUM 25 MG PO TABS: 25.0000 mg | ORAL_TABLET | Freq: Every day | ORAL | Status: DC

## 2019-05-09 MED ORDER — LOSARTAN POTASSIUM 25 MG PO TABS: 25.0000 mg | ORAL_TABLET | Freq: Every day | ORAL | 0 refills | Status: DC

## 2019-05-09 MED ORDER — METOPROLOL SUCCINATE ER 50 MG PO TB24: 50.0000 mg | ORAL_TABLET | Freq: Every day | ORAL | 0 refills | Status: DC

## 2019-05-09 MED ORDER — CARVEDILOL 12.5 MG PO TABS: 12.5000 mg | ORAL_TABLET | Freq: Two times a day (BID) | ORAL | Status: DC

## 2019-05-09 MED ORDER — METOPROLOL SUCCINATE ER 50 MG PO TB24: 50.0000 mg | ORAL_TABLET | Freq: Every day | ORAL | Status: DC

## 2019-05-09 MED ORDER — ANGIOPLASTY BOOK
Freq: Once | Status: AC
  Administered 2019-05-09: 03:00:00
  Filled 2019-05-09: qty 1

## 2019-05-09 MED ORDER — NITROGLYCERIN 0.4 MG SL SUBL: 0.4000 mg | SUBLINGUAL_TABLET | SUBLINGUAL | 0 refills | Status: DC | PRN

## 2019-05-09 MED ORDER — ATORVASTATIN CALCIUM 80 MG PO TABS: 80.0000 mg | ORAL_TABLET | Freq: Every day | ORAL | 0 refills | Status: DC

## 2019-05-09 NOTE — Progress Notes (Addendum)
Progress Note  Patient Name: Kaitlin Torres Date of Encounter: 05/09/2019  Primary Cardiologist: Elouise Munroe, MD   Subjective   No chest pain, no SOB.  No complaints has talked with cardiac rehab and hopes to attend in [place].     Inpatient Medications    Scheduled Meds: . aspirin EC  81 mg Oral Daily  . atorvastatin  80 mg Oral q1800  . enoxaparin (LOVENOX) injection  40 mg Subcutaneous Q24H  . metoprolol succinate  50 mg Oral Daily  . prenatal multivitamin  1 tablet Oral Daily  . sodium chloride flush  3 mL Intravenous Once  . sodium chloride flush  3 mL Intravenous Q12H  . sodium chloride flush  3 mL Intravenous Q12H  . ticagrelor  90 mg Oral BID   Continuous Infusions: . sodium chloride    . nitroGLYCERIN Stopped (05/08/19 2315)   PRN Meds: sodium chloride, acetaminophen **OR** acetaminophen, nitroGLYCERIN, ondansetron (ZOFRAN) IV, promethazine, senna-docusate, sodium chloride flush   Vital Signs    Vitals:   05/08/19 1800 05/08/19 1925 05/09/19 0406 05/09/19 0739  BP: 113/70 (!) 154/96 (!) 145/93   Pulse: 71 90 64 64  Resp: (!) 25 (!) 26 17 (!) 24  Temp:  98.2 F (36.8 C) 98 F (36.7 C)   TempSrc:  Oral Oral   SpO2: 100% 100% 100% 100%  Weight:   104.8 kg   Height:        Intake/Output Summary (Last 24 hours) at 05/09/2019 0806 Last data filed at 05/09/2019 0700 Gross per 24 hour  Intake 1923.1 ml  Output -  Net 1923.1 ml   Last 3 Weights 05/09/2019 05/08/2019 05/08/2019  Weight (lbs) 231 lb 1.6 oz 236 lb 14.4 oz 227 lb  Weight (kg) 104.826 kg 107.457 kg 102.967 kg      Telemetry    SR and NSVT ? - Personally Reviewed  ECG    SR with T wave inversions in III, AVF  - Personally Reviewed  Physical Exam   GEN: No acute distress.   Neck: No JVD Cardiac: RRR, no murmurs, rubs, or gallops. Rt wrist cath site stable + pulse radial no hematoma Respiratory: Clear to auscultation bilaterally. GI: Soft, nontender, non-distended  MS: No edema; No  deformity. Neuro:  Nonfocal  Psych: Normal affect   Labs    High Sensitivity Troponin:   Recent Labs  Lab 05/08/19 0418 05/08/19 0611 05/08/19 0750  TROPONINIHS 58* 92* 116*      Cardiac EnzymesNo results for input(s): TROPONINI in the last 168 hours. No results for input(s): TROPIPOC in the last 168 hours.   Chemistry Recent Labs  Lab 05/08/19 0418 05/09/19 0357  NA 137 136  K 4.1 3.9  CL 105 106  CO2 19* 23  GLUCOSE 119* 118*  BUN 7 9  CREATININE 0.68 0.76  CALCIUM 9.7 8.9  GFRNONAA >60 >60  GFRAA >60 >60  ANIONGAP 13 7     Hematology Recent Labs  Lab 05/08/19 0418 05/09/19 0357  WBC 16.3* 13.1*  RBC 5.98* 5.18*  HGB 13.4 11.5*  HCT 43.5 37.8  MCV 72.7* 73.0*  MCH 22.4* 22.2*  MCHC 30.8 30.4  RDW 16.5* 16.2*  PLT 462* 395    BNPNo results for input(s): BNP, PROBNP in the last 168 hours.   DDimer No results for input(s): DDIMER in the last 168 hours.   Radiology    Dg Chest 2 View  Result Date: 05/08/2019 CLINICAL DATA:  Chest pain for the  past 3 days EXAM: CHEST - 2 VIEW COMPARISON:  05/06/2019 FINDINGS: Interstitial coarsening. There is no edema, consolidation, effusion, or pneumothorax. Normal heart size and mediastinal contours IMPRESSION: Stable exam.  No evidence of acute disease. Electronically Signed   By: Monte Fantasia M.D.   On: 05/08/2019 04:46    Cardiac Studies   Cardiac cath 05/08/19  Prox RCA to Mid RCA lesion is 95% stenosed.  A drug-eluting stent was successfully placed using a STENT SYNERGY DES 3X12.  Post intervention, there is a 0% residual stenosis.  RPDA lesion is 100% stenosed.  Post intervention, there is a 100% residual stenosis.  Ost LAD to Mid LAD lesion is 45% stenosed.  Dist LAD lesion is 50% stenosed.  Prox Cx lesion is 80% stenosed with 90% stenosed side branch in 1st Mrg.  Post intervention, there is a 0% residual stenosis.  Post intervention, the side branch was reduced to 0% residual stenosis.  A  drug-eluting stent was successfully placed using a STENT SYNERGY DES 3X20.  The left ventricular systolic function is normal.  LV end diastolic pressure is normal.  The left ventricular ejection fraction is 50-55% by visual estimate.   1. Severe 2 vessel obstructive CAD    - diffuse nonobstructive moderate disease in the LAD    - 80% proximal LCx, 90% OM1 bifurcation lesion    - 95% mid RCA    - 100% ostial small PDA with left to right collaterals. 2. Good LV function with inferobasal HK 3. Normal LVEDP 4. Successful PCI of the mid RCA with DES x 1.  5. Successful PCI of the LCx/OM1 with DES x 1 6. Unable to cross ostial PDA occlusion with wire.  Plan: DAPT for a minimum of one year. Aggressive risk factor modification.   Echo pending  Patient Profile     38 y.o. female with HTN and HLD now admitted with NSTEMI and stents to RCA and LCX/OM , + tobacco use.   Assessment & Plan    NSTEMI with stents X 2 to mRCA and LCX/OM - DES DAPT X 1 year.  ASA and Brilinta --echo pending prob d/c   CAD residual disease unable to cross PDA and 45-50% stenosis in LAD. BB, STATIN,   HTN elevated 154/96 to 145/93  On toprol XL 50 on no BP meds prior to admit, may need to add ARB  HLD on llipitor 80 new  continue  LDL 169   Tobacco use, so important to stop    NSVT  See tele  on BB may need to change to coreg, she is on brilinta a twice a day med already.    Leukocytosis - neg u/a   Afebrile     For questions or updates, please contact [place] Please consult [email or web address] for contact info under        Signed, Cecilie Kicks, NP  05/09/2019, 8:06 AM    ---------------------------------------------------------------------------------------------   History and all data above reviewed.  Patient examined.  I agree with the findings as above.  Kaitlin Torres is doing well today. Cath site looks stable.  Discussed medical management for secondary prevention.   Constitutional:  No acute distress Eyes: pupils equally round and reactive to light, sclera non-icteric, normal conjunctiva and lids ENMT: normal dentition, moist mucous membranes Cardiovascular: regular rhythm, normal rate, no murmurs. S1 and S2 normal. Radial pulses normal bilaterally. No jugular venous distention.  Respiratory: clear to auscultation bilaterally GI : normal bowel sounds, soft and nontender. No distention.  MSK: extremities warm, well perfused. No edema.  NEURO: grossly nonfocal exam, moves all extremities. PSYCH: alert and oriented x 3, normal mood and affect.   All available labs, radiology testing, previous records reviewed. Agree with documented assessment and plan of my colleague as stated above with the following additions or changes:  Active Problems:   NSTEMI (non-ST elevated myocardial infarction) ([facility])    Plan: Discussed risk factor modification. I have instructed the patient that dual antiplatelet therapy should be taken for 1 year without interruption.  We have discussed the consequences of interrupted dual antiplatelet therapy and the risk for in-stent thrombosis.   Bedside echocardiogram performed demonstrating EF 65-70%.   Counseled on smoking cessation.  [facility] will sign off.   Medication Recommendations:   ASA 81 mg daily and Brilinta 90 mg BID for 1 year without interruption for indication of ACS. Atorvastatin 80 mg daily Metoprolol succinate 50 mg daily Losartan 25 mg daily to be up titrated at follow up Sublingual nitroglycerin 0.4 mg daily. Other recommendations (labs, testing, etc):  none Follow up as an outpatient:  Dr. Margaretann Loveless July 10  Time Spent Directly with Patient:  I have spent a total of 35 minutes with the patient reviewing hospital notes, telemetry, EKGs, labs and examining the patient as well as establishing an assessment and plan that was discussed personally with the patient.  > 50% of time was spent in direct patient care.  Length of  Stay:  LOS: 0 days   Elouise Munroe, MD [facility] 11:48 AM  05/09/2019

## 2019-05-09 NOTE — Progress Notes (Signed)
Brilinta card given.

## 2019-05-09 NOTE — Discharge Instructions (Signed)
Call [facility] at [phone] if any bleeding, swelling or drainage at cath site.  May shower, no tub baths for 48 hours for groin sticks. No lifting over 5 pounds for 3 days.  No Driving for 3 days  Take 1 NTG, under your tongue, while sitting.  If no relief of pain may repeat NTG, one tab every 5 minutes up to 3 tablets total over 15 minutes.  If no relief CALL 911.  If you have dizziness/lightheadness  while taking NTG, stop taking and call 911.        Heart Healthy diet   Do not stop Brilinta or ASA these keep the stents open.  If you stop you could have a heart attack.

## 2019-05-09 NOTE — Progress Notes (Addendum)
Subjective: Patient denies any complaints overnight.   Objective:  Vital signs in last 24 hours: Vitals:   05/08/19 1800 05/08/19 1925 05/09/19 0406 05/09/19 0739  BP: 113/70 (!) 154/96 (!) 145/93   Pulse: 71 90 64 64  Resp: (!) 25 (!) 26 17 (!) 24  Temp:  98.2 F (36.8 C) 98 F (36.7 C)   TempSrc:  Oral Oral   SpO2: 100% 100% 100% 100%  Weight:   104.8 kg   Height:       Physical Exam Vitals signs reviewed.  Constitutional:      General: She is not in acute distress.    Appearance: She is not toxic-appearing.  HENT:     Head: Normocephalic and atraumatic.  Eyes:     Extraocular Movements: Extraocular movements intact.  Cardiovascular:     Rate and Rhythm: Normal rate and regular rhythm.     Heart sounds: Normal heart sounds. No murmur.  Pulmonary:     Effort: Pulmonary effort is normal. No respiratory distress.     Breath sounds: Normal breath sounds.  Abdominal:     Palpations: Abdomen is soft.     Tenderness: There is no abdominal tenderness.  Skin:    General: Skin is warm and dry.  Neurological:     Mental Status: She is alert.  Psychiatric:        Mood and Affect: Mood normal.        Behavior: Behavior normal.       Weight change:   Intake/Output Summary (Last 24 hours) at 05/09/2019 1011 Last data filed at 05/09/2019 0700 Gross per 24 hour  Intake 1923.1 ml  Output -  Net 1923.1 ml      Assessment/Plan:  Active Problems:   NSTEMI (non-ST elevated myocardial infarction) (HCC)  Kaitlin Torres is a 38 y.o. woman with PMHx significant for hypertension, hyperlipidemia, tobacco use with 27 pack year history, PCOS, obesity, and anxiety and family history of early cardiac disease in both parents (at age 71-50s) who presented to the ED describing three days of typical angina: episodes of severe chest pressure and tightening with associated dyspnea, palpitations and diaphoresis.   1 NSTEMI Patient admitted from ED for observation given quality of  chest pain and elevated troponin, 58. Initial EKG was negative, but follow up EKG revealed development of ischemic changes. 05/08/19 Patient underwent heart cath, found to have severe two vessel disease, DES x2, and started on DAPT, beta blocker, and statin. Patient looks well post op, though she has been hypertensive with Toprol XL. Cardiology recommendation for possible medication adjusement. Patient has a long history of tobacco use. She expresses current desire to quit and reports previous attempts (relapse with Chantix). She reports that prior to admission she had cut back from 1 PPD to ~4 cigarettes a day. Echo performed post operatively revealed moderate concentric hypertrophy of left ventricle, no dyskinesis, normal systolic function with ejection fraction of ventricular hypertrophy 60-65%. Patient is planned for discharge today.  -continue aspirin, ticagrelor, metoprolol, atrovastatin -nitro prn for chest pain -cardiac rehab  -smoking cessation counseling -establish care for cardiac followup   2 Leukocytosis  Patient presented with increased WBC, 16.3 with no other signs or symptoms of infection. UA negative, CXR negative. WBC is downtrending, at 13.1 this morning. Leukocytosis is most likely related to patient's MI. No further workup warranted at this time.    3 Iron Deficiency/Microcytosis  05/08/19 Iron studies: ferritin 14  iron 17  TIBC 512  saturation 3%  Patient is asymptomatic. Prior to surgery hemoglobin was 13.4, and post op hemoglobin 11.5, which is consistent with blood loss. Etiology of iron deficiency is unclear, but given normal hemoglobin upon arrival, there is no concern for occult blood loss. No access to previous CBCs for comparison. Patient is being discharged today, initiation of iron supplementation and further work up for etiology can be deferred to primary care.    4 Hyperlipidemia  Patient with chart history of hyperlipidemia. No recent lipid panel. 05/08/19  lipid panel showed elevated cholesterol, triglycerides, and LDL with low HDL.   -high intensity statin -diet education with cardiac rehab    LOS: 0 days   Mercy Moore, Medical Student 05/09/2019, 10:11 AM

## 2019-05-09 NOTE — Progress Notes (Signed)
Pt's Kaitlin Torres closes at 6 pm. Dr Isac Sarna notified. Per pt request Rx to be sent to [facility] in [place]. Pt notified that Rx will be sent.

## 2019-05-09 NOTE — Progress Notes (Signed)
CARDIAC REHAB PHASE I   PRE:  Rate/Rhythm: 78 SR    BP: sitting 152/97    SaO2: 100 RA  MODE:  Ambulation: 420 ft   POST:  Rate/Rhythm: 90 SR, some JR noted    BP: sitting 154/90     SaO2:   Tolerated well, no c/o. Noted JR after walk. Ed completed with pt including MI, stents, Brilinta and other med importance, restrictions, diet, smoking cessation, exercise, NTG, and CRPII. Voiced understanding. She wants to quit smoking but there are many people around her who smoke. Long discussion. Will refer to [place]. Virtual CR might be better for her if possible. Pt is interested in participating in Virtual Cardiac Rehab. Pt advised that Virtual Cardiac Rehab is provided at no cost to the patient. Checklist:  1. Pt has smart device  ie smartphone and/or ipad for downloading an app  Yes 2. Reliable internet/wifi service    Yes 3. Understands how to use their smartphone and navigate within an app.  Yes  Reviewed with pt the scheduling process for virtual cardiac rehab.  Pt verbalized understanding.  [place], ACSM 05/09/2019 9:33 AM

## 2019-05-09 NOTE — Plan of Care (Signed)
  Problem: Clinical Measurements: Goal: Diagnostic test results will improve Outcome: Progressing Goal: Respiratory complications will improve Outcome: Progressing Goal: Cardiovascular complication will be avoided Outcome: Progressing   

## 2019-05-09 NOTE — Progress Notes (Signed)
  Echocardiogram 2D Echocardiogram has been performed.  Kaitlin Torres 05/09/2019, 10:01 AM

## 2019-05-09 NOTE — Progress Notes (Signed)
  Date: 05/09/2019  Patient name: Kaitlin Torres  Medical record number: [id number]  Date of birth: [birth year]   I have seen and evaluated this patient and I have discussed the plan of care with the house staff. Please see their note for complete details. I concur with their findings with the following additions/corrections: Ms Lucie Leather was seen this morning on team rounds.  She was admitted yesterday for a non-STEMI and was taken to the Cath Lab yesterday and was found to have two-vessel disease with stenting to the RCA and to be LCx/OM.  Cardiology has recommended dual antiplatelet therapy with aspirin and Brilinta for 1 year.  She is also been started on Toprol 50 and losartan 25 was added today.  She is on a high intensity statin.  She has been extensively counseled on smoking cessation.  She has been able to quit once in the past for a short duration but then something happened and she resumed smoking.  She has tried Chantix but had troubling psychiatric side effects.  She states her husband is going to help her quit and that she will ask [place] if she would like any sort of pharmaceutical assistance at discharge.  She does not have a PCP but is willing to come to [facility] to establish care.  She does not have transportation issues and has Medicaid to cover her medications.  Inpatient cardiac rehab is working with the patient and is referring her to [facility] for outpatient cardiac rehabilitation.  Her echo has been completed but has not yet been read.  If it is without surprises, she will be stable to be discharged to home with follow-up next week in [facility].  Bartholomew Crews, MD 05/09/2019, 12:23 PM

## 2019-05-09 NOTE — Plan of Care (Signed)
  Problem: Elimination: Goal: Will not experience complications related to urinary retention Outcome: Progressing Note: Voids adequately without difficulty.  No s/s of urinary retention noted.

## 2019-05-11 NOTE — Discharge Summary (Signed)
Name: Kaitlin Torres MRN: [id number] DOB: 1981/09/29 38 y.o. PCP: Sharion Balloon, FNP  Date of Admission: 05/08/2019  4:08 AM Date of Discharge: 05/09/2019 Attending Physician: No att. providers found  Discharge Diagnosis: 1. Non-STEMI  Discharge Medications: Allergies as of 05/09/2019: No known allergies  Medication List:  ClomiPHENE (CLOMID) 50 MG tablet, Take 100 mg by mouth See admin instructions. Take 2 tablets once daily for 5 days. (day 3 through 7 of cycle)  Atorvastatin (LIPITOR) 80 MG tablet, Take 1 tablet (80 mg total) by mouth daily.  Losartan (COZAAR) 25 MG tablet, Take 1 tablet (25 mg total) by mouth daily.  Metoprolol succinate (TOPROL-XL) 50 MG 24 hr tablet, Take 1 tablet (50 mg total) by mouth daily. Take with or immediately following a meal.  NitroGLYCERIN (NITROSTAT) 0.4 MG SL tablet, Place 1 tablet (0.4 mg total) under the tongue every 5 (five) minutes as needed for chest pain.  Aspirin 81 MG EC tablet, Take 1 tablet (81 mg total) by mouth daily.  Ticagrelor (BRILINTA) 90 MG TABS tablet, Take 1 tablet (90 mg total) by mouth 2 (two) times daily.  Prenatal Vit-Fe Fumarate-FA (PRENATAL MULTIVITAMIN) TABS tablet, Take 1 tablet by mouth daily.    Disposition and follow-up:   Ms.Pearlena Heidel was discharged from [facility] in Good condition.  At the hospital follow up visit please address:  1.  Needs continued counseling on smoking cessation. She quit once before for a short duration. She tried Chantix before but had troubling psychiatric side effects.  2.  Labs / imaging needed at time of follow-up: None  3.  Pending labs/ test needing follow-up: None  Follow-up Appointments: Follow-up Information    Elouise Munroe, MD Follow up on 05/17/2019.   Specialty: Cardiology Why: at 11:20 AM   Contact information: [address] [place] [address] [place] Follow up.    Why: Hospital follow up appointment for Thursday July 9th, 2020 at 9:15a. If you are unable to make this appointment, please call the clinic to reschedule. Contact information: [address] [place] Hospital Course by problem list: 1. NSTEMI: Patient presented with 3 day history of tight/dull chest pain and found to have elevated troponin 58 >92>116. Initial EKG showed normal sinus rhythm but subsequent EKG showed new T wave inversion in lead III. Patient was taken for heart cath and found to have two vessel disease with stenting preformed to the RCA and LCx/OM. Per cardiology recommendations, patient initiated on dual antiplatelet therapy with aspirin and brilinta for 1 years duration. Patient also started on metoprolol, losartan, and a high intensity statin. Patient discharged with PCP and cardiology followup.  Discharge Vitals:   BP (!) 145/93 (BP Location: Left Arm)   Pulse 64   Temp 98 F (36.7 C) (Oral)   Resp (!) 24   Ht 5\' 9"  (1.753 m)   Wt 104.8 kg   SpO2 100%   BMI 34.13 kg/m   Pertinent Labs, Studies, and Procedures:  Troponin (7/1 @ 0418): 58 Troponin (7/1 @ 0611): 92 Troponin (7/1 @ 0750): 116  Lipid Panel     Component Value Date/Time   CHOL 240 (H) 05/09/2019 0357   TRIG 233 (H) 05/09/2019 0357   HDL 24 (L) 05/09/2019 0357   CHOLHDL 10.0 05/09/2019 0357   VLDL 47 (H) 05/09/2019 0357  LDLCALC 169 (H) 05/09/2019 0357   Lab Results  Component Value Date   HGBA1C 5.4 05/09/2019   Echo Complete Without Imaging Enhancing Agent (05/09/2019): Impression: 1. The left ventricle has normal systolic function with an ejection fraction of 60-65%. The cavity size was normal. There is moderate concentric left ventricular hypertrophy. Diastolic tissue Doppler velocities are abnormally low for age, but other left ventricular diastolic parameters were normal. No evidence of left ventricular regional wall motion  abnormalities.  Left Heart Cath and Coronary Angiography (05/08/19): Conclusion (see full report for more details):  Prox RCA to Mid RCA lesion is 95% stenosed.  A drug-eluting stent was successfully placed using a STENT SYNERGY DES 3X12.  Post intervention, there is a 0% residual stenosis.  RPDA lesion is 100% stenosed.  Post intervention, there is a 100% residual stenosis.  Ost LAD to Mid LAD lesion is 45% stenosed.  Dist LAD lesion is 50% stenosed.  Prox Cx lesion is 80% stenosed with 90% stenosed side branch in 1st Mrg.  Post intervention, there is a 0% residual stenosis.  Post intervention, the side branch was reduced to 0% residual stenosis.  A drug-eluting stent was successfully placed using a STENT SYNERGY DES 3X20.  The left ventricular systolic function is normal.  LV end diastolic pressure is normal.  The left ventricular ejection fraction is 50-55% by visual estimate.   1. Severe 2 vessel obstructive CAD    - diffuse nonobstructive moderate disease in the LAD    - 80% proximal LCx, 90% OM1 bifurcation lesion    - 95% mid RCA    - 100% ostial small PDA with left to right collaterals. 2. Good LV function with inferobasal HK 3. Normal LVEDP 4. Successful PCI of the mid RCA with DES x 1.  5. Successful PCI of the LCx/OM1 with DES x 1 6. Unable to cross ostial PDA occlusion with wire.  Discharge Instructions: Discharge Instructions    Amb Referral to Cardiac Rehabilitation   Complete by: As directed    Diagnosis:  Coronary Stents NSTEMI     After initial evaluation and assessments completed: Virtual Based Care may be provided alone or in conjunction with Phase 2 Cardiac Rehab based on patient barriers.: Yes   Call MD for:   Complete by: As directed    Chest pain   Call MD for:  difficulty breathing, headache or visual disturbances   Complete by: As directed    Diet - low sodium heart healthy   Complete by: As directed    Discharge instructions    Complete by: As directed    You have been treated in the hospital for a heart attack. A procedure was performed to open up some of the vessels in your heart that were blocked. It is important that you take your medications as prescribed and followup for your clinic appointment. At this appointment, they will give you refills on your medications and make any adjustments that are necessary.   Increase activity slowly   Complete by: As directed       Signed: Jeanmarie Hubert, MD 05/11/2019, 3:15 PM   Pager: [phone]

## 2019-05-16 ENCOUNTER — Telehealth: Payer: Self-pay | Admitting: Internal Medicine

## 2019-05-16 ENCOUNTER — Other Ambulatory Visit: Payer: Self-pay

## 2019-05-16 ENCOUNTER — Ambulatory Visit: Payer: Medicaid Other | Admitting: Internal Medicine

## 2019-05-16 ENCOUNTER — Encounter: Payer: Self-pay | Admitting: Physical Therapy

## 2019-05-16 ENCOUNTER — Ambulatory Visit: Payer: Medicaid Other | Attending: Family | Admitting: Physical Therapy

## 2019-05-16 DIAGNOSIS — Z6834 Body mass index (BMI) 34.0-34.9, adult: Secondary | ICD-10-CM

## 2019-05-16 DIAGNOSIS — F419 Anxiety disorder, unspecified: Secondary | ICD-10-CM | POA: Diagnosis not present

## 2019-05-16 DIAGNOSIS — Z7982 Long term (current) use of aspirin: Secondary | ICD-10-CM | POA: Diagnosis not present

## 2019-05-16 DIAGNOSIS — Z955 Presence of coronary angioplasty implant and graft: Secondary | ICD-10-CM | POA: Diagnosis not present

## 2019-05-16 DIAGNOSIS — R293 Abnormal posture: Secondary | ICD-10-CM | POA: Diagnosis not present

## 2019-05-16 DIAGNOSIS — E669 Obesity, unspecified: Secondary | ICD-10-CM

## 2019-05-16 DIAGNOSIS — Z79899 Other long term (current) drug therapy: Secondary | ICD-10-CM | POA: Diagnosis not present

## 2019-05-16 DIAGNOSIS — I214 Non-ST elevation (NSTEMI) myocardial infarction: Secondary | ICD-10-CM

## 2019-05-16 DIAGNOSIS — Z720 Tobacco use: Secondary | ICD-10-CM

## 2019-05-16 DIAGNOSIS — I252 Old myocardial infarction: Secondary | ICD-10-CM

## 2019-05-16 DIAGNOSIS — M25512 Pain in left shoulder: Secondary | ICD-10-CM | POA: Insufficient documentation

## 2019-05-16 DIAGNOSIS — M6281 Muscle weakness (generalized): Secondary | ICD-10-CM | POA: Insufficient documentation

## 2019-05-16 MED ORDER — METOPROLOL SUCCINATE ER 50 MG PO TB24: 50.0000 mg | ORAL_TABLET | Freq: Every day | ORAL | 3 refills | Status: DC

## 2019-05-16 MED ORDER — BUPROPION HCL ER (XL) 150 MG PO TB24: 150.0000 mg | ORAL_TABLET | Freq: Every day | ORAL | 2 refills | Status: DC

## 2019-05-16 MED ORDER — NITROGLYCERIN 0.4 MG SL SUBL: 0.4000 mg | SUBLINGUAL_TABLET | SUBLINGUAL | 3 refills | Status: DC | PRN

## 2019-05-16 MED ORDER — LOSARTAN POTASSIUM 25 MG PO TABS: 25.0000 mg | ORAL_TABLET | Freq: Every day | ORAL | 3 refills | Status: DC

## 2019-05-16 MED ORDER — ATORVASTATIN CALCIUM 80 MG PO TABS: 80.0000 mg | ORAL_TABLET | Freq: Every day | ORAL | 3 refills | Status: DC

## 2019-05-16 MED ORDER — TICAGRELOR 90 MG PO TABS: 90.0000 mg | ORAL_TABLET | Freq: Two times a day (BID) | ORAL | 3 refills | Status: AC

## 2019-05-16 MED ORDER — ASPIRIN 81 MG PO TBEC: 81.0000 mg | DELAYED_RELEASE_TABLET | Freq: Every day | ORAL | 3 refills | Status: DC

## 2019-05-16 NOTE — Progress Notes (Signed)
   CC: hospital follow up for NSTEMI  HPI:  Ms.Kaitlin Torres is a 38 y.o. female with PMHx of anxiety and other diagnoses as listed below presenting to clinic today for hospital follow up. She was admitted on 7/1 for chest pain and was found to have NSTEMI. LHC performed revealing two vessel disease with stenting performed to RCA and LCx/OM. She was discharged on 7/2. Since discharge, patient denies any chest pain, shortness of breath, palpitations, or dizziness.   Past Medical History:  Diagnosis Date  . Anxiety   . Cough   . Cyst of brain   . Generalized headaches   . Hyperlipidemia   . Hypertension   . Nasal congestion   . PCOS (polycystic ovarian syndrome)   . Rectal pain    Review of Systems:  Review of Systems  Constitutional: Negative for chills, fever and malaise/fatigue.  Respiratory: Negative for cough and shortness of breath.   Cardiovascular: Negative for chest pain and palpitations.  Gastrointestinal: Negative for abdominal pain, constipation, diarrhea, nausea and vomiting.  Musculoskeletal: Negative for myalgias.  Neurological: Negative for dizziness, weakness and headaches.  Endo/Heme/Allergies: Does not bruise/bleed easily.  Psychiatric/Behavioral: Negative for depression. The patient is nervous/anxious.      Physical Exam:  Vitals:   05/16/19 0900  BP: 120/77  Pulse: 77  Temp: 97.9 F (36.6 C)  TempSrc: Oral  SpO2: 100%  Weight: 232 lb 11.2 oz (105.6 kg)  Height: 5\' 9"  (1.753 m)   Physical Exam  Constitutional: She is oriented to person, place, and time and well-developed, well-nourished, and in no distress.  HENT:  Head: Normocephalic and atraumatic.  Cardiovascular: Normal rate, regular rhythm and intact distal pulses. Exam reveals no gallop and no friction rub.  No murmur heard. Pulmonary/Chest: Effort normal and breath sounds normal. No respiratory distress. She has no wheezes. She has no rales.  Musculoskeletal: Normal range of motion.   Neurological: She is alert and oriented to person, place, and time.  Skin: Skin is warm and dry.  Psychiatric: Mood, memory, affect and judgment normal.     Assessment & Plan:   See Encounters Tab for problem based charting.  Patient seen with Dr. Dareen Piano

## 2019-05-16 NOTE — Assessment & Plan Note (Signed)
Patient with NSTEMI on 7/1 s/p stenting of RCA and LCx/OM. Since discharge, patient denies chest pain, shortness of breath, palpitations or dizziness. She reports compliance with all medications.  She expressed concerns about wanting to get pregnant and is Clomid. Given her recent NSTEMI and potential teratogenicity of atorvastatin, patient was advised against pregnancy at this point but told to follow up with cardiologist and OB/GYN for further guidance.   - Continue Lipitor 80mg  qd, ASA 81mg  qd, Losartan 25mg  qd, Metoprolol 50mg  qd, Ticagrelor 90mg  bid - Follow up with cardiology and OB/GYN for recommendations on pregnancy

## 2019-05-16 NOTE — Assessment & Plan Note (Signed)
Patient has 27pack yr history. She reports that since discharge, she has smoked 2-4 cigarettes per day. She expresses interest in quitting and would like medication that would help decrease her cravings and also help with her anxiety.  - Wellbutrin 150mg  qd  - Monitor for efficacy and side effects

## 2019-05-16 NOTE — Telephone Encounter (Signed)
LVM, reminding pt of hfer appt with Dr Margaretann Loveless on 05-17-19.

## 2019-05-16 NOTE — Patient Instructions (Addendum)
[name],  It was a pleasure seeing you in clinic today. Today we discussed your recent hospital admission and your medication regimen. Given your interest in smoking cessation, we prescribed Wellbutrin.  - Please follow up with your cardiologist and OB/GYN to discuss the best plan for conception given your recent heart attack. - Please continue to take medications as prescribed. Monitor for any side effects.  - Please continue to cut down on smoking and follow a heart-healthy diet and incorporate exercise into your daily schedule. - Please return to clinic in 2-3 months for follow up.   Thank you!    Heart Attack A heart attack occurs when blood and oxygen supply to the heart is cut off. A heart attack causes damage to the heart that cannot be fixed. A heart attack is also called a myocardial infarction, or MI. If you think you are having a heart attack, do not wait to see if the symptoms will go away. Get medical help right away. What are the causes? This condition may be caused by:  A fatty substance (plaque) in the blood vessels (arteries). This can block the flow of blood to the heart.  A blood clot in the blood vessels that go to the heart. The blood clot blocks blood flow.  Low blood pressure.  An abnormal heartbeat.  Some diseases, such as problems in red blood cells (anemia)orproblems in breathing (respiratory failure).  Tightening (spasm) of a blood vessel that cuts off blood to the heart.  A tear in a blood vessel of the heart.  High blood pressure. What increases the risk? The following factors may make you more likely to develop this condition:  Aging. The older you are, the higher your risk.  Having a personal or family history of chest pain, heart attack, stroke, or narrowing of the arteries in the legs, arms, head, or stomach (peripheral artery disease).  Being female.  Smoking.  Not getting regular exercise.  Being overweight or obese.  Having high  blood pressure.  Having high cholesterol.  Having diabetes.  Drinking too much alcohol.  Using illegal drugs, such as cocaine or methamphetamine. What are the signs or symptoms? Symptoms of this condition include:  Chest pain. It may feel like: ? Crushing or squeezing. ? Tightness, pressure, fullness, or heaviness.  Pain in the arm, neck, jaw, back, or upper body.  Shortness of breath.  Heartburn.  Upset stomach (indigestion).  Feeling like you may vomit (nauseous).  Cold sweats.  Feeling tired.  Sudden light-headedness. How is this treated? A heart attack must be treated as soon as possible. Treatment may include:  Medicines to: ? Break up or dissolve blood clots. ? Thin blood and help prevent blood clots. ? Treat blood pressure. ? Improve blood flow to the heart. ? Reduce pain. ? Reduce cholesterol.  Procedures to widen a blocked artery and keep it open.  Open heart surgery.  Receiving oxygen.  Making your heart strong again (cardiac rehabilitation) through exercise, education, and counseling. Follow these instructions at home: Medicines  Take over-the-counter and prescription medicines only as told by your doctor. You may need to take medicine: ? To keep your blood from clotting too easily. ? To control blood pressure. ? To lower cholesterol. ? To control heart rhythms.  Do not take these medicines unless your doctor says it is okay: ? NSAIDs, such as ibuprofen. ? Supplements that have vitamin A, vitamin E, or both. ? Hormone replacement therapy that has estrogen with or without  progestin. Lifestyle      Do not use any products that have nicotine or tobacco, such as cigarettes, e-cigarettes, and chewing tobacco. If you need help quitting, ask your doctor.  Avoid secondhand smoke.  Exercise regularly. Ask your doctor about a cardiac rehab program.  Eat heart-healthy foods. Your doctor will tell you what foods to eat.  Stay at a healthy  weight.  Lower your stress level.  Do not use illegal drugs. Alcohol use  Do not drink alcohol if: ? Your doctor tells you not to drink. ? You are pregnant, may be pregnant, or are planning to become pregnant.  If you drink alcohol: ? Limit how much you use to:  0-1 drink a day for women.  0-2 drinks a day for men. ? Know how much alcohol is in your drink. In the [place] one drink equals one 12 oz bottle of beer (355 mL), one 5 oz glass of wine (148 mL), or one 1 oz glass of hard liquor (44 mL). General instructions  Work with your doctor to treat other problems you may have, such as diabetes or high blood pressure.  Get screened for depression. Get treatment if needed.  Keep your vaccines up to date. Get the flu shot (influenza vaccine) every year.  Keep all follow-up visits as told by your doctor. This is important. Contact a doctor if:  You feel very sad.  You have trouble doing your daily activities. Get help right away if:  You have sudden, unexplained discomfort in your chest, arms, back, neck, jaw, or upper body.  You have shortness of breath.  You have sudden sweating or clammy skin.  You feel like you may vomit.  You vomit.  You feel tired or weak.  You get light-headed or dizzy.  You feel your heart beating fast.  You feel your heart skipping beats.  You have blood pressure that is higher than 180/120. These symptoms may be an emergency. Do not wait to see if the symptoms will go away. Get medical help right away. Call your local emergency services (911 in the [place] Do not drive yourself to the hospital. Summary  A heart attack occurs when blood and oxygen supply to the heart is cut off.  Do not take NSAIDs unless your doctor says it is okay.  Do not smoke. Avoid secondhand smoke.  Exercise regularly. Ask your doctor about a cardiac rehab program. This information is not intended to replace advice given to you by your health care provider. Make  sure you discuss any questions you have with your health care provider. Document Released: 04/24/2012 Document Revised: 02/04/2019 Document Reviewed: 02/04/2019 Elsevier Patient Education  [place].
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
## 2019-05-16 NOTE — Assessment & Plan Note (Signed)
Patient encouraged for lifestyle modifications including heart-healthy diet, 30 minutes of exercise daily, and smoking cessation.

## 2019-05-17 ENCOUNTER — Ambulatory Visit (INDEPENDENT_AMBULATORY_CARE_PROVIDER_SITE_OTHER): Payer: Medicaid Other | Admitting: Internal Medicine

## 2019-05-17 ENCOUNTER — Encounter: Payer: Self-pay | Admitting: Internal Medicine

## 2019-05-17 VITALS — BP 109/74 | HR 61 | Temp 97.3°F | Ht 69.0 in | Wt 229.8 lb

## 2019-05-17 DIAGNOSIS — Z720 Tobacco use: Secondary | ICD-10-CM | POA: Diagnosis not present

## 2019-05-17 DIAGNOSIS — E669 Obesity, unspecified: Secondary | ICD-10-CM | POA: Diagnosis not present

## 2019-05-17 DIAGNOSIS — I214 Non-ST elevation (NSTEMI) myocardial infarction: Secondary | ICD-10-CM | POA: Diagnosis not present

## 2019-05-17 DIAGNOSIS — I10 Essential (primary) hypertension: Secondary | ICD-10-CM

## 2019-05-17 DIAGNOSIS — E782 Mixed hyperlipidemia: Secondary | ICD-10-CM

## 2019-05-17 NOTE — Progress Notes (Signed)
Internal Medicine Clinic Attending  I saw and evaluated the patient.  I personally confirmed the key portions of the history and exam documented by Dr. Aslam and I reviewed pertinent patient test results.  The assessment, diagnosis, and plan were formulated together and I agree with the documentation in the resident's note.     

## 2019-05-17 NOTE — Progress Notes (Signed)
Cardiology Office Note:    Date:  05/17/2019   ID:  Kaitlin Torres, DOB October 02, 1981, MRN [id number]  PCP:  Baruch Gouty, FNP  Cardiologist:  Elouise Munroe, MD  Electrophysiologist:  None   Referring MD: Sharion Balloon, FNP   Chief Complaint: 7 day post hospital follow up  History of Present Illness:    Kaitlin Torres is a 38 y.o. female with a hx of HTN, HLD, and early family history of MI and SCD who presents today for post hospital follow up after NSTEMI with stent x 2 to RCA and circ system.   She is feeling well and has no specific complaints. She is having some difficulty with cardiac diet and we discussed slow progressive changes for sustained improvement.   She tells me she and her husband are actively trying to get pregnant for the past two years and she is on clomid. She does not intend to stop trying for pregnancy, but is not currently pregnant. This is very concerning in the setting of medications for NSTEMI that are highly indicated, but are contraindicated in pregnancy.   The patient denies chest pain, chest pressure, dyspnea at rest or with exertion, palpitations, PND, orthopnea, or leg swelling. Denies syncope or presyncope. Denies dizziness or lightheadedness. Denies snoring and has not been evaluated for sleep apnea.  Past Medical History:  Diagnosis Date  . Anxiety   . Cough   . Cyst of brain   . Generalized headaches   . Hyperlipidemia   . Hypertension   . Nasal congestion   . PCOS (polycystic ovarian syndrome)   . Rectal pain     Past Surgical History:  Procedure Laterality Date  . ANKLE SURGERY  july 2005   screws placed in right ankle   . CORONARY STENT INTERVENTION N/A 05/08/2019   Procedure: CORONARY STENT INTERVENTION;  Surgeon: Martinique, Peter M, MD;  Location: [place] [facility];  Service: Cardiovascular;  Laterality: N/A;  . FRACTURE SURGERY  2005   right ankle  . HEMORRHOID SURGERY    . HEMORRHOID SURGERY  2015  . INCISION AND DRAINAGE  BREAST ABSCESS  november 2011   left breast   . LEFT HEART CATH AND CORONARY ANGIOGRAPHY N/A 05/08/2019   Procedure: LEFT HEART CATH AND CORONARY ANGIOGRAPHY;  Surgeon: Martinique, Peter M, MD;  Location: [place] [facility];  Service: Cardiovascular;  Laterality: N/A;    Current Medications: Current Meds  Medication Sig  . aspirin 81 MG EC tablet Take 1 tablet (81 mg total) by mouth daily.  Marland Kitchen buPROPion (WELLBUTRIN XL) 150 MG 24 hr tablet Take 1 tablet (150 mg total) by mouth daily.  . clomiPHENE (CLOMID) 50 MG tablet Take 100 mg by mouth See admin instructions. Take 2 tablets once daily for 5 days. (day 3 through 7 of cycle)  . nitroGLYCERIN (NITROSTAT) 0.4 MG SL tablet Place 1 tablet (0.4 mg total) under the tongue every 5 (five) minutes as needed for chest pain.  . Prenatal Vit-Fe Fumarate-FA (PRENATAL MULTIVITAMIN) TABS tablet Take 1 tablet by mouth daily.   . ticagrelor (BRILINTA) 90 MG TABS tablet Take 1 tablet (90 mg total) by mouth 2 (two) times daily.  . [DISCONTINUED] atorvastatin (LIPITOR) 80 MG tablet Take 1 tablet (80 mg total) by mouth daily.  . [DISCONTINUED] losartan (COZAAR) 25 MG tablet Take 1 tablet (25 mg total) by mouth daily.  . [DISCONTINUED] metoprolol succinate (TOPROL-XL) 50 MG 24 hr tablet Take 1 tablet (50 mg total)  by mouth daily. Take with or immediately following a meal.     Allergies:   Patient has no known allergies.   Social History   Socioeconomic History  . Marital status: Married    Spouse name: Not on file  . Number of children: 3  . Years of education: 52  . Highest education level: Associate degree: academic program  Occupational History  . Occupation: Waitress  Social Needs  . Financial resource strain: Not on file  . Food insecurity    Worry: Not on file    Inability: Not on file  . Transportation needs    Medical: Not on file    Non-medical: Not on file  Tobacco Use  . Smoking status: Current Every Day Smoker    Packs/day: 0.10    Years:  26.00    Pack years: 2.60    Types: Cigarettes  . Smokeless tobacco: Never Used  . Tobacco comment: 4 cigs per day   Substance and Sexual Activity  . Alcohol use: No    Frequency: Never    Comment: quit 2018  . Drug use: No  . Sexual activity: Yes    Birth control/protection: None    Comment: married, 3 kids, vaginal, trying for another  Lifestyle  . Physical activity    Days per week: Not on file    Minutes per session: Not on file  . Stress: Not on file  Relationships  . Social Herbalist on phone: Not on file    Gets together: Not on file    Attends religious service: Not on file    Active member of club or organization: Not on file    Attends meetings of clubs or organizations: Not on file    Relationship status: Not on file  Other Topics Concern  . Not on file  Social History Narrative   Lives at home with her mother, husband and three daughters.   Right-handed.   Approximately 20 cups caffeine per day (4-5 glasses holding 32oz of tea).     Family History: The patient's family history includes Aneurysm in her brother; Cancer in her maternal grandmother; Diabetes in her father and paternal grandmother; Heart disease in her father; Hyperlipidemia in her father; Hypertension in her mother; Stroke in her father.  ROS:   Please see the history of present illness.    All other systems reviewed and are negative.  EKGs/Labs/Other Studies Reviewed:    The following studies were reviewed today:  EKG:  NSR, TWI III, avF  Recent Labs: 05/08/2019: Magnesium 1.9 05/09/2019: BUN 9; Creatinine, Ser 0.76; Hemoglobin 11.5; Platelets 395; Potassium 3.9; Sodium 136  Recent Lipid Panel    Component Value Date/Time   CHOL 240 (H) 05/09/2019 0357   TRIG 233 (H) 05/09/2019 0357   HDL 24 (L) 05/09/2019 0357   CHOLHDL 10.0 05/09/2019 0357   VLDL 47 (H) 05/09/2019 0357   LDLCALC 169 (H) 05/09/2019 0357    Physical Exam:    VS:  BP 109/74   Pulse 61   Temp (!) 97.3 F  (36.3 C)   Ht 5\' 9"  (1.753 m)   Wt 229 lb 12.8 oz (104.2 kg)   LMP 05/10/2019   SpO2 96%   BMI 33.94 kg/m     Wt Readings from Last 5 Encounters:  05/17/19 229 lb 12.8 oz (104.2 kg)  05/16/19 232 lb 11.2 oz (105.6 kg)  05/09/19 231 lb 1.6 oz (104.8 kg)  10/22/18 224 lb (101.6 kg)  08/01/18 225 lb (102.1 kg)     Constitutional: No acute distress Eyes: sclera non-icteric, normal conjunctiva and lids ENMT: normal dentition, moist mucous membranes Cardiovascular: regular rhythm, normal rate, no murmurs. S1 and S2 normal. Radial pulses normal bilaterally. No jugular venous distention.  Respiratory: clear to auscultation bilaterally GI : normal bowel sounds, soft and nontender. No distention.   MSK: extremities warm, well perfused. No edema.  NEURO: grossly nonfocal exam, moves all extremities. PSYCH: alert and oriented x 3, normal mood and affect.   ASSESSMENT:    1. NSTEMI (non-ST elevated myocardial infarction) ([place])   2. Tobacco abuse   3. Obesity (BMI 30.0-34.9)   4. Essential hypertension   5. Mixed hyperlipidemia    PLAN:    NSTEMI - she needs to continue DAPT for 1 year. I have instructed the patient that dual antiplatelet therapy should be taken for 1 year without interruption.  We have discussed the consequences of interrupted dual antiplatelet therapy and the risk for in-stent thrombosis.   Fertility therapy/intending pregnancy - I am very concerned that she is attempting pregnancy, and will continue to do so in the setting of recent NSTEMI with medical therapy that is contraindicated in pregnancy. She has HLD, and needs goal directed medical therapy for severe CAD. I will review this with my pharmacist colleague and attempt to stop any medications possible that are contraindicated in pregnancy for safety of a potential fetus. Patient does not intend to stop trying for pregnancy.   HTN - ideally continue losartan though this is contraindicated in pregnancy - will discuss  with pharmacist.   HLD - continue atorvastatin, review with pharmacist.   TIME SPENT WITH PATIENT: 25 minutes of direct patient care. More than 50% of that time was spent on coordination of care and counseling regarding NSTEMI, CAD, and medication safety in pregnancy.  Cherlynn Kaiser, MD [place]  [facility]   Medication Adjustments/Labs and Tests Ordered: Current medicines are reviewed at length with the patient today.  Concerns regarding medicines are outlined above.  Orders Placed This Encounter  Procedures  . EKG 12-Lead   No orders of the defined types were placed in this encounter.   Patient Instructions  Medication Instructions:  CONTINUE WITH CURRENT MEDICATIONS  UNTIL YOUR CONVERSATION WITH [facility]   If you need a refill on your cardiac medications before your next appointment, please call your pharmacy.   Lab work:  NOT NEEDED    Testing/Procedures: NOT NEEDED  Follow-Up: At [facility], you and your health needs are our priority.  As part of our continuing mission to provide you with exceptional heart care, we have created designated Provider Care Teams.  These Care Teams include your primary Cardiologist (physician) and Advanced Practice Providers (APPs -  Physician Assistants and Nurse Practitioners) who all work together to provide you with the care you need, when you need it. . You will need a follow up appointment in Jun 18, 2019 AT 11 AM .  Please call our office 2 months in advance to schedule this appointment.  You may see Elouise Munroe, MD*  Any Other Special Instructions Will Be Listed Below (If Applicable).   THE [facility]- PHARMACIST WILL GIVE YOU A CALL NEXT WEEK  TO DISCUSS MEDICATION AND OPTIONS.   DO NOT HAVE UNPROTECTED SEX UNTIL  MEDICATION HAVE BEEN  REVIEWED.

## 2019-05-17 NOTE — Patient Instructions (Addendum)
Medication Instructions:  CONTINUE WITH CURRENT MEDICATIONS  UNTIL YOUR CONVERSATION WITH [facility]   If you need a refill on your cardiac medications before your next appointment, please call your pharmacy.   Lab work:  NOT NEEDED    Testing/Procedures: NOT NEEDED  Follow-Up: At [facility], you and your health needs are our priority.  As part of our continuing mission to provide you with exceptional heart care, we have created designated Provider Care Teams.  These Care Teams include your primary Cardiologist (physician) and Advanced Practice Providers (APPs -  Physician Assistants and Nurse Practitioners) who all work together to provide you with the care you need, when you need it. . You will need a follow up appointment in Jun 18, 2019 AT 11 AM .  Please call our office 2 months in advance to schedule this appointment.  You may see Elouise Munroe, MD*  Any Other Special Instructions Will Be Listed Below (If Applicable).   THE [facility]- PHARMACIST WILL GIVE YOU A CALL NEXT WEEK  TO DISCUSS MEDICATION AND OPTIONS.   DO NOT HAVE UNPROTECTED SEX UNTIL  MEDICATION HAVE BEEN  REVIEWED.

## 2019-05-17 NOTE — Addendum Note (Signed)
Addended by: Aldine Contes on: 05/17/2019 10:40 AM   Modules accepted: Level of Service

## 2019-05-21 ENCOUNTER — Telehealth: Payer: Self-pay | Admitting: Pharmacist Clinician (PhC)/ Clinical Pharmacy Specialist

## 2019-05-21 ENCOUNTER — Encounter: Payer: Medicaid Other | Admitting: Physical Therapy

## 2019-05-21 MED ORDER — LABETALOL HCL 100 MG PO TABS: 100.0000 mg | ORAL_TABLET | Freq: Two times a day (BID) | ORAL | 5 refills | Status: DC

## 2019-05-21 NOTE — Telephone Encounter (Signed)
Dr. Margaretann Loveless asked that we review medication list for patient.  Discharged from [facility] on 7/2 post NSTEMI with atorvastatin, losartan, metoprolol succ, sublingual nitroglycerin and ticagrelor.  Patient also taking clomiphene, hoping to become pregnant.  We were asked to review and change/stop any medications that would be detrimental to pregnancy.   Patient chart reviewed and spoke with patient on phone.  For now, we will need to stop both the losartan and atorvastatin.  Switch metoprolol succinate to labetalol.  Will start her on 100 mg bid and advised that she will need to monitor BP for response.  Found case report of pregnancy with ticagrelor from 2015.  Patient had normal pregnancy, ticagrelor was discontinued 2 weeks prior to due date.  Patient delivered healthy, although low birth weight infant.  This was explained to Kaitlin Torres.  Advised that should she develop chest pains and need nitroglycerin, she should call 911 first.    Labetalol 100 mg bid sent to [facility] in [place].

## 2019-05-23 ENCOUNTER — Encounter: Payer: Self-pay | Admitting: Physical Therapy

## 2019-05-23 ENCOUNTER — Other Ambulatory Visit: Payer: Self-pay

## 2019-05-23 ENCOUNTER — Ambulatory Visit: Payer: Medicaid Other | Admitting: Physical Therapy

## 2019-05-23 DIAGNOSIS — M6281 Muscle weakness (generalized): Secondary | ICD-10-CM | POA: Diagnosis not present

## 2019-05-23 DIAGNOSIS — M25512 Pain in left shoulder: Secondary | ICD-10-CM | POA: Diagnosis not present

## 2019-05-23 DIAGNOSIS — R293 Abnormal posture: Secondary | ICD-10-CM | POA: Diagnosis not present
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
## 2019-05-29 ENCOUNTER — Other Ambulatory Visit: Payer: Self-pay

## 2019-05-29 ENCOUNTER — Ambulatory Visit: Payer: Medicaid Other | Admitting: Physical Therapy

## 2019-05-29 ENCOUNTER — Encounter: Payer: Self-pay | Admitting: Physical Therapy

## 2019-05-29 DIAGNOSIS — M25512 Pain in left shoulder: Secondary | ICD-10-CM

## 2019-05-29 DIAGNOSIS — R293 Abnormal posture: Secondary | ICD-10-CM | POA: Diagnosis not present

## 2019-05-29 DIAGNOSIS — M6281 Muscle weakness (generalized): Secondary | ICD-10-CM | POA: Diagnosis not present
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
## 2019-05-30 DIAGNOSIS — N970 Female infertility associated with anovulation: Secondary | ICD-10-CM | POA: Diagnosis not present

## 2019-06-07 ENCOUNTER — Telehealth: Payer: Self-pay | Admitting: Family Medicine

## 2019-06-07 ENCOUNTER — Other Ambulatory Visit: Payer: Self-pay

## 2019-06-10 ENCOUNTER — Other Ambulatory Visit: Payer: Self-pay

## 2019-06-10 ENCOUNTER — Telehealth: Payer: Self-pay | Admitting: *Deleted

## 2019-06-10 ENCOUNTER — Encounter: Payer: Self-pay | Admitting: Family Medicine

## 2019-06-10 ENCOUNTER — Ambulatory Visit: Payer: Medicaid Other | Admitting: Family Medicine

## 2019-06-10 VITALS — BP 106/65 | HR 79 | Temp 98.0°F | Ht 69.0 in | Wt 230.0 lb

## 2019-06-10 DIAGNOSIS — G930 Cerebral cysts: Secondary | ICD-10-CM | POA: Insufficient documentation

## 2019-06-10 DIAGNOSIS — Z7689 Persons encountering health services in other specified circumstances: Secondary | ICD-10-CM

## 2019-06-10 DIAGNOSIS — E782 Mixed hyperlipidemia: Secondary | ICD-10-CM | POA: Insufficient documentation

## 2019-06-10 DIAGNOSIS — E282 Polycystic ovarian syndrome: Secondary | ICD-10-CM | POA: Insufficient documentation

## 2019-06-10 DIAGNOSIS — I214 Non-ST elevation (NSTEMI) myocardial infarction: Secondary | ICD-10-CM | POA: Diagnosis not present

## 2019-06-10 DIAGNOSIS — I10 Essential (primary) hypertension: Secondary | ICD-10-CM | POA: Insufficient documentation

## 2019-06-10 DIAGNOSIS — F411 Generalized anxiety disorder: Secondary | ICD-10-CM | POA: Insufficient documentation

## 2019-06-10 DIAGNOSIS — E669 Obesity, unspecified: Secondary | ICD-10-CM

## 2019-06-10 DIAGNOSIS — E559 Vitamin D deficiency, unspecified: Secondary | ICD-10-CM | POA: Insufficient documentation

## 2019-06-10 DIAGNOSIS — Z720 Tobacco use: Secondary | ICD-10-CM

## 2019-06-10 HISTORY — DX: Cerebral cysts: G93.0

## 2019-06-10 MED ORDER — BLOOD PRESSURE KIT DEVI: 1.0000 | Freq: Two times a day (BID) | 0 refills | Status: DC

## 2019-06-10 NOTE — Telephone Encounter (Signed)
New message  The patient is returning your call. Please call.

## 2019-06-10 NOTE — Progress Notes (Signed)
Subjective:  Patient ID: Kaitlin Torres, female    DOB: 01-Aug-1981, 38 y.o.   MRN: [id number]  Patient Care Team: Baruch Gouty, FNP as PCP - General (Family Medicine) Elouise Munroe, MD as PCP - Cardiology (Cardiology)   Chief Complaint:  Establish Care   HPI: Kaitlin Torres is a 38 y.o. female presenting on 06/10/2019 for Establish Care  1. Encounter to establish care  Pt presents today to establish care. Pt states she was seeing Dr. Edrick Oh. Pt states he has retired and the office closed. Pt states she is currently followed by OB/GYN in [place], [place]. States she is trying to get pregnant and has been unsuccessful. States she has been trying for 2 years without success. States her OB/GYN put her on Clomid recently. Pt aware the medications she is currently on due to her recent NSTEMI contraindicated in pregnancy. Pt aware she needs to discuss this further with her OB/GYN and cardiologist. She was seen by cardiology on 05/17/2019 for follow up and this was discussed during this visit also. Cardiologist was consulting with pharmacist. Pt was to continue current medications until cardiology made further recommendations.    2. Essential (primary) hypertension  Complaint with meds - Yes Current Medications - labetalol, losartan Checking BP at home - No Exercising Regularly - Cardiac Rehab Watching Salt intake - No Pertinent ROS:  Headache - No Fatigue - No Visual Disturbances - No Chest pain - No Dyspnea - No Palpitations - No LE edema - No They report good compliance with medications and can restate their regimen by memory. No medication side effects.  Family, social, and smoking history reviewed.   BP Readings from Last 3 Encounters:  06/10/19 106/65  05/17/19 109/74  05/16/19 120/77   CMP Latest Ref Rng & Units 05/09/2019 05/08/2019 11/15/2017  Glucose 70 - 99 mg/dL 118(H) 119(H) 93  BUN 6 - 20 mg/dL _0 Creatinine 0.44 - 1.00 mg/dL 0.76 0.68 0.69  Sodium 135 - 145  mmol/L 136 137 142  Potassium 3.5 - 5.1 mmol/L 3.9 4.1 4.5  Chloride 98 - 111 mmol/L 106 105 105  CO2 22 - 32 mmol/L 23 19(L) 24  Calcium 8.9 - 10.3 mg/dL 8.9 9.7 9.9  Total Protein 6.0 - 8.5 g/dL - - 7.1  Total Bilirubin 0.0 - 1.2 mg/dL - - 0.2  Alkaline Phos 39 - 117 IU/L - - 79  AST 0 - 40 IU/L - - 18  ALT 0 - 32 IU/L - - 16      3. Mixed hyperlipidemia  Compliant with medications - Yes Current medications - atorvastatin 80 mg Side effects from medications - No Diet - finding it hard to adhere to a cardiac healthy diet Exercise - Cardiac Rehab  Lab Results  Component Value Date   CHOL 240 (H) 05/09/2019   HDL 24 (L) 05/09/2019   LDLCALC 169 (H) 05/09/2019   TRIG 233 (H) 05/09/2019   CHOLHDL 10.0 05/09/2019     Family and personal medical history reviewed. Smoking and ETOH history reviewed.    4. NSTEMI (non-ST elevated myocardial infarction) [facility])  Pt was admitted to [facility] on 05/08/2019 for NSTEMI. Pt underwent LHC. LHC revealed two vessel disease, RCA and LCx / OM, two stents were placed. Pt did well post procedure and was discharged home on 05/09/2019. Pt is under the care of cardiology and is to continue DAPT for one year.  Pt has been doing well since  discharge. No chest pain, fatigue, shortness of breath, orthopnea, PND, leg swelling, significant weight changes, dizziness, or syncope.    5. Obesity (BMI 30.0-34.9)  Pt has been attending cardiac rehab. Tries to watch diet but finds this difficult.    6. Tobacco abuse  Has been placed on Wellbutrin but feels it is not helping her quit smoking. States she feels it may have increased her cravings. She has cut back to 10-12 cigarettes per day. She states she has tried Chantix in the past and did not tolerate it well.   7. GAD (generalized anxiety disorder)  Ongoing for several years. States she used to take a daily controller medication years ago. She feels she is at the point where she may to be back on  something for her anxiety. She has daily symptoms and is very irritable due to her anxiety. She is currently trying to get pregnant despite recommendations from cardiology. She is followed by OB/GYN in [place], [place]. Antianxiety medications and potential adverse fetal effects discussed in detail. Pt would like to discuss this with her OB/GYN prior to initiating therapy. No SI or HI. Able to control anxiety with relaxation. GAD 7 : Generalized Anxiety Score 06/10/2019 05/16/2019  Nervous, Anxious, on Edge 3 3  Control/stop worrying 2 2  Worry too much - different things 3 2  Trouble relaxing 1 1  Restless 1 1  Easily annoyed or irritable 3 3  Afraid - awful might happen 1 2  Total GAD 7 Score 14 14  Anxiety Difficulty - Not difficult at all     8. Vitamin D deficiency  Not on oral repletion therapy. Last Vit D level on 10/08/2018 very low at 9.1. Pt states she used to take oral repletion therapy, but has not in a while.  Pt not taking oral repletion therapy. Denies bone pain and tenderness, muscle weakness, fracture, and difficulty walking.      Relevant past medical, surgical, family, and social history reviewed and updated as indicated.  Allergies and medications reviewed and updated. Date reviewed: Chart in Epic.   Past Medical History:  Diagnosis Date  . Anxiety   . 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 Cough   . Cyst of brain   . Generalized headaches   . Heart attack ([place]) 04/2019  . Hyperlipidemia   . Hypertension   . Nasal congestion   . PCOS (polycystic ovarian syndrome)   . Rectal pain     Past Surgical History:  Procedure Laterality Date  . ANKLE SURGERY  july 2005   screws placed in right ankle   . CORONARY STENT INTERVENTION N/A 05/08/2019   Procedure: CORONARY STENT INTERVENTION;  Surgeon: Martinique, Peter M, MD;  Location: [facility];  Service: Cardiovascular;  Laterality: N/A;  . FRACTURE SURGERY  2005   right ankle  . HEMORRHOID SURGERY    . HEMORRHOID SURGERY  2015  .  INCISION AND DRAINAGE BREAST ABSCESS  november 2011   left breast   . LEFT HEART CATH AND CORONARY ANGIOGRAPHY N/A 05/08/2019   Procedure: LEFT HEART CATH AND CORONARY ANGIOGRAPHY;  Surgeon: Martinique, Peter M, MD;  Location: [facility];  Service: Cardiovascular;  Laterality: N/A;    Social History   Socioeconomic History  . Marital status: Married    Spouse name: Not on file  . Number of children: 3  . Years of education: 51  . Highest education level: Associate degree: academic program  Occupational History  . Occupation: Educational psychologist  Social Needs  . Financial resource strain: Not on file  . Food insecurity    Worry: Not on file    Inability: Not on file  . Transportation needs    Medical: Not on file    Non-medical: Not on file  Tobacco Use  . Smoking status: Current Every Day Smoker    Packs/day: 0.10    Years: 26.00    Pack years: 2.60    Types: Cigarettes  . Smokeless tobacco: Never Used  . Tobacco comment: 4 cigs per day   Substance and Sexual Activity  . Alcohol use: No    Frequency: Never    Comment: quit 2018  . Drug use: No  . Sexual activity: Yes    Birth control/protection: None    Comment: married, 3 kids, vaginal, trying for another  Lifestyle  . Physical activity    Days per week: Not on file    Minutes per session: Not on file  . Stress: Not on file  Relationships  . Social Herbalist on phone: Not on file    Gets together: Not on file    Attends religious service: Not on file    Active member of club or organization: Not on file    Attends meetings of clubs or organizations: Not on file    Relationship status: Not on file  . Intimate partner violence    Fear of current or ex partner: Not on file    Emotionally abused: Not on file    Physically abused: Not on file    Forced sexual activity: Not on file  Other Topics Concern  . Not on file  Social History Narrative   Lives at home with her mother, husband and three daughters.    Right-handed.   Approximately 20 cups caffeine per day (4-5 glasses holding 32oz of tea).    Outpatient Encounter Medications as of 06/10/2019  Medication Sig  . aspirin 81 MG EC tablet Take 1 tablet (81 mg total) by mouth daily.  Marland Kitchen buPROPion (WELLBUTRIN XL) 150 MG 24 hr tablet Take 1 tablet (150 mg total) by mouth daily.  . clomiPHENE (CLOMID) 50 MG tablet Take 100 mg by mouth See admin instructions. Take 2 tablets once daily for 5 days. (day 3 through 7 of cycle)  . labetalol (NORMODYNE) 100 MG tablet Take 1 tablet (100 mg total) by mouth 2 (two) times daily.  . nitroGLYCERIN (NITROSTAT) 0.4 MG SL tablet Place 1 tablet (0.4 mg total) under the tongue every 5 (five) minutes as needed for chest pain.  . Prenatal Vit-Fe Fumarate-FA (PRENATAL MULTIVITAMIN) TABS tablet Take 1 tablet by mouth daily.   . ticagrelor (BRILINTA) 90 MG TABS tablet Take 1 tablet (90 mg total) by mouth 2 (two) times daily.  Marland Kitchen atorvastatin (LIPITOR) 80 MG tablet Take 80 mg by mouth daily.  . Blood Pressure Monitoring (BLOOD PRESSURE KIT) DEVI 1 kit by Does not apply route 2 (two) times daily.  Marland Kitchen losartan (COZAAR) 25 MG tablet Take 25 mg by mouth daily.  . metoprolol succinate (TOPROL-XL) 50 MG 24 hr tablet TAKE 1 TABLET BY MOUTH ONCE DAILY TAKE WITH OR IMMEDIATELY FOLLOWING A MEAL   No facility-administered encounter medications on file as of 06/10/2019.     No Known Allergies  Review of Systems  Constitutional: Negative for activity change, appetite change, chills, diaphoresis, fatigue, fever and unexpected weight change.  Eyes: Negative for photophobia and visual disturbance.  Respiratory: Negative for cough, chest tightness,  shortness of breath and wheezing.   Cardiovascular: Negative for chest pain, palpitations and leg swelling.  Gastrointestinal: Negative for abdominal distention, abdominal pain, anal bleeding, blood in stool, constipation, diarrhea, nausea, rectal pain and vomiting.  Endocrine: Negative for cold  intolerance, heat intolerance, polydipsia, polyphagia and polyuria.  Genitourinary: Positive for menstrual problem. Negative for decreased urine volume, difficulty urinating, dyspareunia, dysuria, enuresis, flank pain, frequency, genital sores, hematuria, pelvic pain, urgency, vaginal bleeding, vaginal discharge and vaginal pain.  Musculoskeletal: Negative for arthralgias and myalgias.  Skin: Negative for color change and pallor.  Neurological: Negative for dizziness, tremors, seizures, syncope, facial asymmetry, speech difficulty, weakness, light-headedness, numbness and headaches.  Hematological: Does not bruise/bleed easily.  Psychiatric/Behavioral: Positive for agitation, decreased concentration, dysphoric mood and sleep disturbance. Negative for behavioral problems, confusion, hallucinations, self-injury and suicidal ideas. The patient is nervous/anxious. The patient is not hyperactive.   All other systems reviewed and are negative.       Objective:  BP 106/65   Pulse 79   Temp 98 F (36.7 C)   Ht '5\' 9"'$  (1.753 m)   Wt 230 lb (104.3 kg)   LMP 05/26/2019   BMI 33.97 kg/m    Wt Readings from Last 3 Encounters:  06/10/19 230 lb (104.3 kg)  05/17/19 229 lb 12.8 oz (104.2 kg)  05/16/19 232 lb 11.2 oz (105.6 kg)    Physical Exam Vitals signs and nursing note reviewed.  Constitutional:      General: She is not in acute distress.    Appearance: Normal appearance. She is well-developed and well-groomed. She is obese. She is not ill-appearing, toxic-appearing or diaphoretic.  HENT:     Head: Normocephalic and atraumatic.     Jaw: There is normal jaw occlusion.     Right Ear: Hearing, tympanic membrane, ear canal and external ear normal.     Left Ear: Hearing, tympanic membrane, ear canal and external ear normal.     Nose: Nose normal.     Mouth/Throat:     Lips: Pink.     Mouth: Mucous membranes are moist.     Pharynx: Oropharynx is clear. Uvula midline.  Eyes:     General:  Lids are normal.     Extraocular Movements: Extraocular movements intact.     Conjunctiva/sclera: Conjunctivae normal.     Pupils: Pupils are equal, round, and reactive to light.  Neck:     Musculoskeletal: Normal range of motion and neck supple.     Thyroid: No thyroid mass, thyromegaly or thyroid tenderness.     Vascular: No carotid bruit or JVD.     Trachea: Trachea and phonation normal.  Cardiovascular:     Rate and Rhythm: Normal rate and regular rhythm.     Chest Wall: PMI is not displaced.     Pulses: Normal pulses.     Heart sounds: Normal heart sounds. No murmur. No friction rub. No gallop.   Pulmonary:     Effort: Pulmonary effort is normal. No respiratory distress.     Breath sounds: Normal breath sounds. No wheezing.  Abdominal:     General: Bowel sounds are normal. There is no distension or abdominal bruit.     Palpations: Abdomen is soft. There is no hepatomegaly or splenomegaly.     Tenderness: There is no abdominal tenderness. There is no right CVA tenderness or left CVA tenderness.     Hernia: No hernia is present.  Musculoskeletal: Normal range of motion.     Right lower leg: No  edema.     Left lower leg: No edema.  Lymphadenopathy:     Cervical: No cervical adenopathy.  Skin:    General: Skin is warm and dry.     Capillary Refill: Capillary refill takes less than 2 seconds.     Coloration: Skin is not cyanotic, jaundiced or pale.     Findings: No rash.  Neurological:     General: No focal deficit present.     Mental Status: She is alert and oriented to person, place, and time.     Cranial Nerves: Cranial nerves are intact.     Sensory: Sensation is intact.     Motor: Motor function is intact.     Coordination: Coordination is intact.     Gait: Gait is intact.     Deep Tendon Reflexes: Reflexes are normal and symmetric.  Psychiatric:        Attention and Perception: Attention and perception normal.        Mood and Affect: Affect normal. Mood is anxious.         Speech: Speech normal.        Behavior: Behavior is cooperative.        Thought Content: Thought content normal.        Cognition and Memory: Cognition and memory normal.        Judgment: Judgment normal.     Comments: fidgety      Results for orders placed or performed during the hospital encounter of 05/08/19  SARS Coronavirus 2 (CEPHEID - Performed in [place] hospital lab), [facility] Order   Specimen: Nasopharyngeal Swab  Result Value Ref Range   SARS Coronavirus 2 NEGATIVE NEGATIVE  Basic metabolic panel  Result Value Ref Range   Sodium 137 135 - 145 mmol/L   Potassium 4.1 3.5 - 5.1 mmol/L   Chloride 105 98 - 111 mmol/L   CO2 19 (L) 22 - 32 mmol/L   Glucose, Bld 119 (H) 70 - 99 mg/dL   BUN 7 6 - 20 mg/dL   Creatinine, Ser 0.68 0.44 - 1.00 mg/dL   Calcium 9.7 8.9 - 10.3 mg/dL   GFR calc non Af Amer >60 >60 mL/min   GFR calc Af Amer >60 >60 mL/min   Anion gap 13 5 - 15  CBC  Result Value Ref Range   WBC 16.3 (H) 4.0 - 10.5 K/uL   RBC 5.98 (H) 3.87 - 5.11 MIL/uL   Hemoglobin 13.4 12.0 - 15.0 g/dL   HCT 43.5 36.0 - 46.0 %   MCV 72.7 (L) 80.0 - 100.0 fL   MCH 22.4 (L) 26.0 - 34.0 pg   MCHC 30.8 30.0 - 36.0 g/dL   RDW 16.5 (H) 11.5 - 15.5 %   Platelets 462 (H) 150 - 400 K/uL   nRBC 0.0 0.0 - 0.2 %  Troponin I (High Sensitivity)  Result Value Ref Range   Troponin I (High Sensitivity) 58 (H) <18 ng/L  Troponin I (High Sensitivity)  Result Value Ref Range   Troponin I (High Sensitivity) 92 (H) <18 ng/L  Troponin I (High Sensitivity)  Result Value Ref Range   Troponin I (High Sensitivity) 116 (HH) <18 ng/L  HIV antibody (Routine Testing)  Result Value Ref Range   HIV Screen 4th Generation wRfx Non Reactive Non Reactive  Urinalysis, Routine w reflex microscopic  Result Value Ref Range   Color, Urine YELLOW YELLOW   APPearance CLEAR CLEAR   Specific Gravity, Urine 1.019 1.005 - 1.030   pH  5.0 5.0 - 8.0   Glucose, UA NEGATIVE NEGATIVE mg/dL   Hgb urine dipstick  NEGATIVE NEGATIVE   Bilirubin Urine NEGATIVE NEGATIVE   Ketones, ur NEGATIVE NEGATIVE mg/dL   Protein, ur NEGATIVE NEGATIVE mg/dL   Nitrite NEGATIVE NEGATIVE   Leukocytes,Ua NEGATIVE NEGATIVE  Lipid panel  Result Value Ref Range   Cholesterol 240 (H) 0 - 200 mg/dL   Triglycerides 233 (H) <150 mg/dL   HDL 24 (L) >40 mg/dL   Total CHOL/HDL Ratio 10.0 RATIO   VLDL 47 (H) 0 - 40 mg/dL   LDL Cholesterol 169 (H) 0 - 99 mg/dL  Hemoglobin A1c  Result Value Ref Range   Hgb A1c MFr Bld 5.4 4.8 - 5.6 %   Mean Plasma Glucose 108.28 mg/dL  Basic metabolic panel  Result Value Ref Range   Sodium 136 135 - 145 mmol/L   Potassium 3.9 3.5 - 5.1 mmol/L   Chloride 106 98 - 111 mmol/L   CO2 23 22 - 32 mmol/L   Glucose, Bld 118 (H) 70 - 99 mg/dL   BUN 9 6 - 20 mg/dL   Creatinine, Ser 0.76 0.44 - 1.00 mg/dL   Calcium 8.9 8.9 - 10.3 mg/dL   GFR calc non Af Amer >60 >60 mL/min   GFR calc Af Amer >60 >60 mL/min   Anion gap 7 5 - 15  CBC  Result Value Ref Range   WBC 13.1 (H) 4.0 - 10.5 K/uL   RBC 5.18 (H) 3.87 - 5.11 MIL/uL   Hemoglobin 11.5 (L) 12.0 - 15.0 g/dL   HCT 37.8 36.0 - 46.0 %   MCV 73.0 (L) 80.0 - 100.0 fL   MCH 22.2 (L) 26.0 - 34.0 pg   MCHC 30.4 30.0 - 36.0 g/dL   RDW 16.2 (H) 11.5 - 15.5 %   Platelets 395 150 - 400 K/uL   nRBC 0.0 0.0 - 0.2 %  Magnesium  Result Value Ref Range   Magnesium 1.9 1.7 - 2.4 mg/dL  Iron and TIBC  Result Value Ref Range   Iron 17 (L) 28 - 170 ug/dL   TIBC 512 (H) 250 - 450 ug/dL   Saturation Ratios 3 (L) 10.4 - 31.8 %   UIBC 495 ug/dL  Ferritin  Result Value Ref Range   Ferritin 14 11 - 307 ng/mL  I-Stat beta hCG blood, ED  Result Value Ref Range   I-stat hCG, quantitative <5.0 <5 mIU/mL   Comment 3          POCT Activated clotting time  Result Value Ref Range   Activated Clotting Time 301 seconds  POCT Activated clotting time  Result Value Ref Range   Activated Clotting Time 263 seconds  POCT Activated clotting time  Result Value Ref  Range   Activated Clotting Time 571 seconds  ECHOCARDIOGRAM COMPLETE  Result Value Ref Range   Weight 3,697.6 oz   Height 69 in   BP 145/93 mmHg       Pertinent labs & imaging results that were available during my care of the patient were reviewed by me and considered in my medical decision making.  Assessment & Plan:  Brittini was seen today for establish care.  Diagnoses and all orders for this visit:  Encounter to establish care  Essential (primary) hypertension BP well controlled. Changes were not made in regimen. Daily blood pressure log given with instructions on how to fill out and told to bring to next visit. Gaol BP 130/80. Pt aware to  report any persistent high or low readings. DASH diet and exercise encouraged. Exercise at least 150 minutes per week and increase as tolerated. Goal BMI > 25. Stress management encouraged. Smoking cessation discussed. Avoid excessive alcohol. Avoid NSAID's. Avoid more than 2000 mg of sodium daily. Medications as prescribed. Labs pending, Follow up as scheduled.  -     CMP14+EGFR -     CBC with Differential/Platelet -     Lipid panel -     Thyroid Panel With TSH -     Blood Pressure Monitoring (BLOOD PRESSURE KIT) DEVI; 1 kit by Does not apply route 2 (two) times daily.  Mixed hyperlipidemia Diet encouraged - increase intake of fresh fruits and vegetables, increase intake of lean proteins. Bake, broil, or grill foods. Avoid fried, greasy, and fatty foods. Avoid fast foods. Increase intake of fiber-rich whole grains.  Exercise encouraged - at least 150 minutes per week and advance as tolerated.  Goal BMI < 25.  Medications as prescribed. Labs pending. Follow up in 3-6 months as discussed.  -     Lipid panel  NSTEMI (non-ST elevated myocardial infarction) [facility]) Doing well since discharge from hospital. Is attending cardiac rehab and following with cardiology. Is on DAPT, to continue for one year post stent placement. Labs pending. Report any  symptoms. Pt aware of symptoms that require emergent evaluation.  -     CMP14+EGFR -     CBC with Differential/Platelet -     Lipid panel -     Blood Pressure Monitoring (BLOOD PRESSURE KIT) DEVI; 1 kit by Does not apply route 2 (two) times daily.  Obesity (BMI 30.0-34.9) Diet and exercise encouraged. Labs pending. BMI recheck at next visit.  -     CMP14+EGFR -     CBC with Differential/Platelet -     Lipid panel -     Thyroid Panel With TSH  Tobacco abuse Currently on Wellbutrin. States she has not been able to quit smoking. Is down to 10-12 cigarettes per day. Counseling provided. Will continue to attempt cessation.  -     CBC with Differential/Platelet  GAD (generalized anxiety disorder) Pt actively trying to get pregnant. Discussed the potential risks of SSRI or SNRI therapy during pregnancy. Pt would like to speak with her OB/GYN prior to initiating medications for her anxiety. No SI or HI. Can control with stress management and relaxation.  -     Thyroid Panel With TSH  Vitamin D deficiency Labs pending. Not on repletion therapy. If indicated, will start repletion therapy. Eat foods rich in Vit D including milk, orange juice, yogurt with vitamin D added, salmon or mackerel, canned tuna fish, cereals with vitamin D added, and cod liver oil. Get out in the sun but make sure to wear at least SPF 30 sunscreen.  -     VITAMIN D 25 Hydroxy (Vit-D Deficiency, Fractures)     Continue all other maintenance medications.  Follow up plan: Return in 3 months (on 09/10/2019), or if symptoms worsen or fail to improve, for HTN, Lipids, Vit D, BMI.  Continue healthy lifestyle choices, including diet (rich in fruits, vegetables, and lean proteins, and low in salt and simple carbohydrates) and exercise (at least 30 minutes of moderate physical activity daily).  Educational handout given for GAD  The above assessment and management plan was discussed with the patient. The patient verbalized  understanding of and has agreed to the management plan. Patient is aware to call the clinic if symptoms persist  or worsen. Patient is aware when to return to the clinic for a follow-up visit. Patient educated on when it is appropriate to go to the emergency department.   Monia Pouch, FNP-C [facility] [phone] 06/10/19

## 2019-06-10 NOTE — Patient Instructions (Signed)

## 2019-06-10 NOTE — Telephone Encounter (Signed)
A message was,re: schedule changed.

## 2019-06-11 ENCOUNTER — Ambulatory Visit: Payer: Medicaid Other | Attending: Family | Admitting: Physical Therapy

## 2019-06-11 ENCOUNTER — Ambulatory Visit: Payer: Medicaid Other | Admitting: Family Medicine

## 2019-06-11 DIAGNOSIS — M6281 Muscle weakness (generalized): Secondary | ICD-10-CM | POA: Insufficient documentation

## 2019-06-11 DIAGNOSIS — M25512 Pain in left shoulder: Secondary | ICD-10-CM | POA: Insufficient documentation

## 2019-06-11 DIAGNOSIS — R293 Abnormal posture: Secondary | ICD-10-CM | POA: Insufficient documentation

## 2019-06-11 LAB — CBC WITH DIFFERENTIAL/PLATELET
Basophils Absolute: 0.1 10*3/uL (ref 0.0–0.2)
Basos: 1 %
EOS (ABSOLUTE): 0.1 10*3/uL (ref 0.0–0.4)
Eos: 1 %
Hematocrit: 34.9 % (ref 34.0–46.6)
Hemoglobin: 11 g/dL — ABNORMAL LOW (ref 11.1–15.9)
Immature Grans (Abs): 0.1 10*3/uL (ref 0.0–0.1)
Immature Granulocytes: 1 %
Lymphocytes Absolute: 2.3 10*3/uL (ref 0.7–3.1)
Lymphs: 20 %
MCH: 21.9 pg — ABNORMAL LOW (ref 26.6–33.0)
MCHC: 31.5 g/dL (ref 31.5–35.7)
MCV: 69 fL — ABNORMAL LOW (ref 79–97)
Monocytes Absolute: 0.8 10*3/uL (ref 0.1–0.9)
Monocytes: 7 %
Neutrophils Absolute: 8.2 10*3/uL — ABNORMAL HIGH (ref 1.4–7.0)
Neutrophils: 70 %
Platelets: 332 10*3/uL (ref 150–450)
RBC: 5.03 x10E6/uL (ref 3.77–5.28)
RDW: 16.5 % — ABNORMAL HIGH (ref 11.7–15.4)
WBC: 11.6 10*3/uL — ABNORMAL HIGH (ref 3.4–10.8)

## 2019-06-11 LAB — LIPID PANEL
Chol/HDL Ratio: 9.4 ratio — ABNORMAL HIGH (ref 0.0–4.4)
Cholesterol, Total: 206 mg/dL — ABNORMAL HIGH (ref 100–199)
HDL: 22 mg/dL — ABNORMAL LOW (ref >39)
LDL Calculated: 106 mg/dL — ABNORMAL HIGH (ref 0–99)
Triglycerides: 389 mg/dL — ABNORMAL HIGH (ref 0–149)
VLDL Cholesterol Cal: 78 mg/dL — ABNORMAL HIGH (ref 5–40)

## 2019-06-11 LAB — CMP14+EGFR
ALT: 15 IU/L (ref 0–32)
AST: 23 IU/L (ref 0–40)
Albumin/Globulin Ratio: 1.4 (ref 1.2–2.2)
Albumin: 4 g/dL (ref 3.8–4.8)
Alkaline Phosphatase: 69 IU/L (ref 39–117)
BUN/Creatinine Ratio: 11 (ref 9–23)
BUN: 9 mg/dL (ref 6–20)
Bilirubin Total: 0.3 mg/dL (ref 0.0–1.2)
CO2: 21 mmol/L (ref 20–29)
Calcium: 9.4 mg/dL (ref 8.7–10.2)
Chloride: 106 mmol/L (ref 96–106)
Creatinine, Ser: 0.81 mg/dL (ref 0.57–1.00)
GFR calc Af Amer: 108 mL/min/{1.73_m2} (ref >59)
GFR calc non Af Amer: 94 mL/min/{1.73_m2} (ref >59)
Globulin, Total: 2.8 g/dL (ref 1.5–4.5)
Glucose: 81 mg/dL (ref 65–99)
Potassium: 4.3 mmol/L (ref 3.5–5.2)
Sodium: 140 mmol/L (ref 134–144)
Total Protein: 6.8 g/dL (ref 6.0–8.5)

## 2019-06-11 LAB — THYROID PANEL WITH TSH
Free Thyroxine Index: 1.8 (ref 1.2–4.9)
T3 Uptake Ratio: 23 % — ABNORMAL LOW (ref 24–39)
T4, Total: 7.8 ug/dL (ref 4.5–12.0)
TSH: 1.26 u[IU]/mL (ref 0.450–4.500)

## 2019-06-11 LAB — VITAMIN D 25 HYDROXY (VIT D DEFICIENCY, FRACTURES): Vit D, 25-Hydroxy: 23.3 ng/mL — ABNORMAL LOW (ref 30.0–100.0)

## 2019-06-11 MED ORDER — ICOSAPENT ETHYL 1 G PO CAPS: 2.0000 g | ORAL_CAPSULE | Freq: Two times a day (BID) | ORAL | 3 refills | Status: DC

## 2019-06-11 NOTE — Addendum Note (Signed)
Addended by: Baruch Gouty on: 06/11/2019 12:55 PM   Modules accepted: Orders

## 2019-06-12 ENCOUNTER — Other Ambulatory Visit: Payer: Self-pay

## 2019-06-12 ENCOUNTER — Ambulatory Visit: Payer: Medicaid Other | Admitting: Physical Therapy

## 2019-06-12 ENCOUNTER — Encounter: Payer: Self-pay | Admitting: Physical Therapy

## 2019-06-12 DIAGNOSIS — M25512 Pain in left shoulder: Secondary | ICD-10-CM | POA: Diagnosis not present

## 2019-06-12 DIAGNOSIS — R293 Abnormal posture: Secondary | ICD-10-CM | POA: Diagnosis not present

## 2019-06-12 DIAGNOSIS — M6281 Muscle weakness (generalized): Secondary | ICD-10-CM
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
## 2019-06-18 ENCOUNTER — Other Ambulatory Visit: Payer: Self-pay

## 2019-06-18 ENCOUNTER — Ambulatory Visit: Payer: Medicaid Other | Admitting: Physical Therapy

## 2019-06-18 ENCOUNTER — Telehealth: Payer: Medicaid Other | Admitting: Internal Medicine

## 2019-06-20 ENCOUNTER — Ambulatory Visit: Payer: Medicaid Other | Admitting: Physical Therapy

## 2019-07-03 NOTE — Progress Notes (Signed)
Virtual Visit via Video Note   This visit type was conducted due to national recommendations for restrictions regarding the COVID-19 Pandemic (e.g. social distancing) in an effort to limit this patient's exposure and mitigate transmission in our community.  Due to her co-morbid illnesses, this patient is at least at moderate risk for complications without adequate follow up.  This format is felt to be most appropriate for this patient at this time.  All issues noted in this document were discussed and addressed.  A limited physical exam was performed with this format.  Please refer to the patient's chart for her consent to telehealth for [facility]  Patient has given verbal permission to conduct this visit via virtual appointment and to bill insurance 07/04/2019 10:29 AM     Date:  07/04/2019   ID:  Kaitlin Torres, DOB Jan 05, 1981, MRN [id number]  Patient Location: Home Provider Location: Home  PCP:  Baruch Gouty, FNP  Cardiologist:  Elouise Munroe, MD  Electrophysiologist:  None   Evaluation Performed:  Follow-Up Visit  Chief Complaint:  Follow Up  History of Present Illness:    Kaitlin Torres is a 38 y.o. female with we are following for ongoing assessment and management of hypertension, hyperlipidemia, family history of premature CAD and SCD.  The patient was recently hospitalized in June 2020 with non-ST elevation MI with stent placement x2 to the RCA and circumflex.  On follow-up visit dated 05/17/2019 the patient was without chest pain, dyspnea, or fatigue.  At that time of that office visit she was trying to become pregnant, however medication she was taking post NSTEMI were contraindicated in pregnancy.  The patient was counseled on waiting on getting pregnant in the setting of her current medical therapy, as her medications were contraindicated during pregnancy.  She was continued on current medication regimen which included losartan even though this was contraindicated  during pregnancy, she was continued on atorvastatin.  She was continued on dual antiplatelet therapy for 1 year and was instructed to continue this without interruption.  Copied for accuracy: "Fertility therapy/intending pregnancy - I am very concerned that she is attempting pregnancy, and will continue to do so in the setting of recent NSTEMI with medical therapy that is contraindicated in pregnancy. She has HLD, and needs goal directed medical therapy for severe CAD. I will review this with my pharmacist colleague and attempt to stop any medications possible that are contraindicated in pregnancy for safety of a potential fetus. Patient does not intend to stop trying for pregnancy," (per Dr.Acharya 05/17/2019.).  Our pharmacist did speak with the patient on 05/21/2019.  Medication changes were made in anticipation of conception.  Losartan and atorvastatin were discontinued metoprolol was changed to labetalol 100 mg twice daily.  Ticagrelor would need to be discontinued 2 weeks prior to due date if she became pregnant.  The patient today is without complaint.  She has not conceived yet but continues to plan to do so.  She is taking walks to keep healthy does have some soreness from climbing hills.  She denies chest pain, rapid heart rhythm, dyspnea, or palpitations.  She has no bleeding or excessive bruising.   The patient does not have symptoms concerning for COVID-19 infection (fever, chills, cough, or new shortness of breath).    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   Cough    Cyst of brain    Generalized headaches    Heart  attack ([place]) 04/2019   Hyperlipidemia    Hypertension    Nasal congestion    PCOS (polycystic ovarian syndrome)    Rectal pain    Past Surgical History:  Procedure Laterality Date   ANKLE SURGERY  july 2005   screws placed in right ankle    CORONARY STENT INTERVENTION N/A 05/08/2019   Procedure: CORONARY STENT  INTERVENTION;  Surgeon: Martinique, Peter M, MD;  Location: [place] [facility];  Service: Cardiovascular;  Laterality: N/A;   FRACTURE SURGERY  2005   right ankle   HEMORRHOID SURGERY     HEMORRHOID SURGERY  2015   INCISION AND DRAINAGE BREAST ABSCESS  november 2011   left breast    LEFT HEART CATH AND CORONARY ANGIOGRAPHY N/A 05/08/2019   Procedure: LEFT HEART CATH AND CORONARY ANGIOGRAPHY;  Surgeon: Martinique, Peter M, MD;  Location: [place] [facility];  Service: Cardiovascular;  Laterality: N/A;     Current Meds  Medication Sig   aspirin 81 MG EC tablet Take 1 tablet (81 mg total) by mouth daily.   labetalol (NORMODYNE) 100 MG tablet Take 1 tablet (100 mg total) by mouth 2 (two) times daily.   nitroGLYCERIN (NITROSTAT) 0.4 MG SL tablet Place 1 tablet (0.4 mg total) under the tongue every 5 (five) minutes as needed for chest pain.   Prenatal Vit-Fe Fumarate-FA (PRENATAL MULTIVITAMIN) TABS tablet Take 1 tablet by mouth daily.    ticagrelor (BRILINTA) 90 MG TABS tablet Take 1 tablet (90 mg total) by mouth 2 (two) times daily.   [DISCONTINUED] atorvastatin (LIPITOR) 80 MG tablet Take 80 mg by mouth daily.   [DISCONTINUED] Blood Pressure Monitoring (BLOOD PRESSURE KIT) DEVI 1 kit by Does not apply route 2 (two) times daily.   [DISCONTINUED] buPROPion (WELLBUTRIN XL) 150 MG 24 hr tablet Take 1 tablet (150 mg total) by mouth daily.     Allergies:   Patient has no known allergies.   Social History   Tobacco Use   Smoking status: Current Every Day Smoker    Packs/day: 0.10    Years: 26.00    Pack years: 2.60    Types: Cigarettes   Smokeless tobacco: Never Used   Tobacco comment: 4 cigs per day   Substance Use Topics   Alcohol use: No    Frequency: Never    Comment: quit 2018   Drug use: No     Family Hx: The patient's family history includes Aneurysm in her brother; Cancer in her maternal grandmother; Diabetes in her father and paternal grandmother; Heart disease in her  father; Hyperlipidemia in her father; Hypertension in her mother; Stroke in her father.  ROS:   Please see the history of present illness.    All other systems reviewed and are negative.   Prior CV studies:   The following studies were reviewed today: Left Heart Cath with PCI 05/08/2019    Prox RCA to Mid RCA lesion is 95% stenosed.  A drug-eluting stent was successfully placed using a STENT SYNERGY DES 3X12.  Post intervention, there is a 0% residual stenosis.  RPDA lesion is 100% stenosed.  Post intervention, there is a 100% residual stenosis.  Ost LAD to Mid LAD lesion is 45% stenosed.  Dist LAD lesion is 50% stenosed.  Prox Cx lesion is 80% stenosed with 90% stenosed side branch in 1st Mrg.  Post intervention, there is a 0% residual stenosis.  Post intervention, the side branch was reduced to 0% residual stenosis.  A drug-eluting stent was  successfully placed using a STENT SYNERGY DES 3X20.  The left ventricular systolic function is normal.  LV end diastolic pressure is normal.  The left ventricular ejection fraction is 50-55% by visual estimate.   1. Severe 2 vessel obstructive CAD    - diffuse nonobstructive moderate disease in the LAD    - 80% proximal LCx, 90% OM1 bifurcation lesion    - 95% mid RCA    - 100% ostial small PDA with left to right collaterals. 2. Good LV function with inferobasal HK 3. Normal LVEDP 4. Successful PCI of the mid RCA with DES x 1.  5. Successful PCI of the LCx/OM1 with DES x 1 6. Unable to cross ostial PDA occlusion with wire.  Plan: DAPT for a minimum of one year. Aggressive risk factor modification.    Echocardiogram 05/09/2019 1. The left ventricle has normal systolic function with an ejection fraction of 60-65%. The cavity size was normal. There is moderate concentric left ventricular hypertrophy. Diastolic tissue Doppler velocities are abnormally low for age, but other left  ventricular diastolic parameters were normal. No  evidence of left ventricular regional wall motion abnormalities.  Labs/Other Tests and Data Reviewed:    EKG:  No ECG reviewed.  Recent Labs: 05/08/2019: Magnesium 1.9 06/10/2019: ALT 15; BUN 9; Creatinine, Ser 0.81; Hemoglobin 11.0; Platelets 332; Potassium 4.3; Sodium 140; TSH 1.260   Recent Lipid Panel Lab Results  Component Value Date/Time   CHOL 206 (H) 06/10/2019 11:35 AM   TRIG 389 (H) 06/10/2019 11:35 AM   HDL 22 (L) 06/10/2019 11:35 AM   CHOLHDL 9.4 (H) 06/10/2019 11:35 AM   CHOLHDL 10.0 05/09/2019 03:57 AM   LDLCALC 106 (H) 06/10/2019 11:35 AM    Wt Readings from Last 3 Encounters:  07/04/19 231 lb (104.8 kg)  06/10/19 230 lb (104.3 kg)  05/17/19 229 lb 12.8 oz (104.2 kg)     Objective:    Vital Signs:  BP (!) 131/91    Pulse 80    Ht _0  (1.753 m)    Wt 231 lb (104.8 kg)    BMI 34.11 kg/m    VITAL SIGNS:  reviewed GEN:  no acute distress EYES:  sclerae anicteric, EOMI - Extraocular Movements Intact RESPIRATORY:  normal respiratory effort, symmetric expansion MUSCULOSKELETAL:  no obvious deformities. NEURO:  alert and oriented x 3, no obvious focal deficit PSYCH:  normal affect  ASSESSMENT & PLAN:    1 CAD: Status post non-STEMI June 2020, with DES to RCA, and circumflex.  The patient is currently without discomfort, is walking every day, denies any dyspnea on exertion.  She has been seen by our pharmacist to go over all of her medications as she is trying to conceive.  Medications were changed to discontinue losartan and atorvastatin, metoprolol now changed to labetalol 100 mg twice daily.  She was to continue ticagrelor and aspirin.  She will need to have close follow-up should she become pregnant for ongoing cardiology evaluation throughout pregnancy.  Is been recommended that she discontinue ticagrelor 2 weeks prior to her due date, prior to going into labor.   2. Hypertension: Blood pressures currently controlled on medication regimen using labetalol 100 mg  twice daily.  She is doing her best to adhere to a low-sodium diet and to exercise daily.  3.  Hypercholesterolemia: Currently not on any statin medications as she is trying to become pregnant.  This will greatly increase her chances of restenosis, or new areas of stenosis in nonobstructed arteries.  Will need to be followed closely.  COVID-19 Education: The signs and symptoms of COVID-19 were discussed with the patient and how to seek care for testing (follow up with PCP or arrange E-visit).  The importance of social distancing was discussed today.  Time:   Today, I have spent 10 minutes with the patient with telehealth technology discussing the above problems.     Medication Adjustments/Labs and Tests Ordered: Current medicines are reviewed at length with the patient today.  Concerns regarding medicines are outlined above.   Tests Ordered: No orders of the defined types were placed in this encounter.   Medication Changes: No orders of the defined types were placed in this encounter.   Disposition:  Follow up 6 months unless she becomes pregnant, at which time she will need to have closer follow-up appointments  Signed, Phill Myron. West Pugh, ANP, AACC  07/04/2019 10:29 AM    [facility]

## 2019-07-04 ENCOUNTER — Telehealth (INDEPENDENT_AMBULATORY_CARE_PROVIDER_SITE_OTHER): Payer: Medicaid Other | Admitting: Adult Health

## 2019-07-04 ENCOUNTER — Encounter: Payer: Self-pay | Admitting: Adult Health

## 2019-07-04 VITALS — BP 131/91 | HR 80 | Ht 69.0 in | Wt 231.0 lb

## 2019-07-04 DIAGNOSIS — I2510 Atherosclerotic heart disease of native coronary artery without angina pectoris: Secondary | ICD-10-CM | POA: Diagnosis not present

## 2019-07-04 DIAGNOSIS — I10 Essential (primary) hypertension: Secondary | ICD-10-CM

## 2019-07-04 DIAGNOSIS — E7800 Pure hypercholesterolemia, unspecified: Secondary | ICD-10-CM

## 2019-07-04 NOTE — Patient Instructions (Signed)
Medication Instructions:  Continue current medications  If you need a refill on your cardiac medications before your next appointment, please call your pharmacy.  Labwork: None Ordered   Testing/Procedures: None Ordered   Follow-Up: You will need a follow up appointment in 6 months.  Please call our office 2 months in advance to schedule this appointment.  You may see Gayatri A Acharya, MD or one of the following Advanced Practice Providers on your designated Care Team:   Rhonda Barrett, PA-C . Kathryn Lawrence, DNP, ANP     At [facility], you and your health needs are our priority.  As part of our continuing mission to provide you with exceptional heart care, we have created designated Provider Care Teams.  These Care Teams include your primary Cardiologist (physician) and Advanced Practice Providers (APPs -  Physician Assistants and Nurse Practitioners) who all work together to provide you with the care you need, when you need it.  Thank you for choosing [facility] at [facility]!!     

## 2019-07-05 ENCOUNTER — Encounter (HOSPITAL_COMMUNITY)
Admission: RE | Admit: 2019-07-05 | Discharge: 2019-07-05 | Disposition: A | Discharge status: Home / Self Care | Payer: Medicaid Other | Source: Ambulatory Visit | Attending: Internal Medicine | Admitting: Internal Medicine

## 2019-07-05 ENCOUNTER — Other Ambulatory Visit: Payer: Self-pay

## 2019-07-05 DIAGNOSIS — R197 Diarrhea, unspecified: Secondary | ICD-10-CM | POA: Diagnosis not present

## 2019-07-24 ENCOUNTER — Other Ambulatory Visit: Payer: Self-pay

## 2019-07-24 ENCOUNTER — Ambulatory Visit: Payer: Medicaid Other | Attending: Family | Admitting: Physical Therapy

## 2019-07-24 DIAGNOSIS — M25512 Pain in left shoulder: Secondary | ICD-10-CM | POA: Diagnosis not present

## 2019-07-24 DIAGNOSIS — M6281 Muscle weakness (generalized): Secondary | ICD-10-CM | POA: Insufficient documentation

## 2019-07-24 DIAGNOSIS — R293 Abnormal posture: Secondary | ICD-10-CM | POA: Insufficient documentation

## 2019-07-24 NOTE — Patient Instructions (Signed)
  Strengthening: Resisted Flexion   Hold tubing with left arm at side. Pull forward and up. Move shoulder through pain-free range of motion. Repeat __10__ times per set. Do _2-3___ sets per session. Do _2-3___ sessions per day. http://orth.exer.us/824   Copyright  VHI. All rights reserved.  Strengthening: Resisted Extension   Hold tubing in t hand, arm forward. Pull arm back, elbow straight. Repeat __10__ times per set. Do _2-3___ sets per session. Do _2-3___ sessions per day.  http://orth.exer.us/832   Copyright  VHI. All rights reserved.  Strengthening: Resisted Internal Rotation   Hold tubing in left hand, elbow at side and forearm out. Rotate forearm in across body. Repeat __10__ times per set. Do _2-3___ sets per session. Do _2-3___ sessions per day.
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
## 2019-08-05 ENCOUNTER — Encounter (HOSPITAL_COMMUNITY)
Admission: RE | Admit: 2019-08-05 | Discharge: 2019-08-05 | Disposition: A | Discharge status: Home / Self Care | Payer: Medicaid Other | Source: Ambulatory Visit | Attending: Internal Medicine | Admitting: Internal Medicine

## 2019-08-05 ENCOUNTER — Other Ambulatory Visit: Payer: Self-pay

## 2019-08-05 VITALS — BP 130/80 | HR 78 | Temp 98.1°F | Ht 69.0 in | Wt 222.9 lb

## 2019-08-05 DIAGNOSIS — I214 Non-ST elevation (NSTEMI) myocardial infarction: Secondary | ICD-10-CM

## 2019-08-05 DIAGNOSIS — Z955 Presence of coronary angioplasty implant and graft: Secondary | ICD-10-CM | POA: Diagnosis not present

## 2019-08-05 NOTE — Progress Notes (Signed)
Cardiac/Pulmonary Rehab Medication Review by a Pharmacist  Does the patient  feel that his/her medications are working for him/her?  yes  Has the patient been experiencing any side effects to the medications prescribed?  no  Does the patient measure his/her own blood pressure or blood glucose at home?  yes   Does the patient have any problems obtaining medications due to transportation or finances?   no  Understanding of regimen: good Understanding of indications: good Potential of compliance: good  Questions asked to Determine Patient Understanding of Medication Regimen:  1. What is the name of the medication?  2. What is the medication used for?  3. When should it be taken?  4. How much should be taken?  5. How will you take it?  6. What side effects should you report?  Understanding Defined as: Excellent: All questions above are correct Good: Questions 1-4 are correct Fair: Questions 1-2 are correct  Poor: 1 or none of the above questions are correct   Pharmacist comments: Patient presented today for cardiac rehab. We reviewed her medications and she is compliant with regimen and tolerating. She has a BP cuff but does not routinely measure BP and HR. I reviewed importance of monitoring at different times of the day and when she feels odd. Otherwise, patient had no questions.  Thanks for the opportunity to participate in the care of this patient,  Isac Sarna, BS [place], [place] Pharmacist Pager [phone] 08/05/2019 1:27 PM

## 2019-08-05 NOTE — Progress Notes (Signed)
Cardiac Individual Treatment Plan  Patient Details  Name: Kaitlin Torres MRN: [id number] Date of Birth: [birth year] Referring Provider:     CARDIAC REHAB PHASE II ORIENTATION from 08/05/2019 in [place]  Referring Provider  Margaretann Loveless      Initial Encounter Date:    CARDIAC REHAB PHASE II ORIENTATION from 08/05/2019 in [place]  Date  08/05/19      Visit Diagnosis: NSTEMI (non-ST elevated myocardial infarction) [facility])  Status post coronary artery stent placement  Patient's Home Medications on Admission:  Current Outpatient Medications:  .  aspirin 81 MG EC tablet, Take 1 tablet (81 mg total) by mouth daily., Disp: 30 tablet, Rfl: 3 .  clomiPHENE (CLOMID) 50 MG tablet, Take 50 mg by mouth as directed. Take 2 tablet by mouth once daily for 5 days(day 3-7 on cycle), Disp: , Rfl:  .  fluconazole (DIFLUCAN) 150 MG tablet, Take 150 mg by mouth as directed. For yeast infection, Disp: , Rfl:  .  labetalol (NORMODYNE) 100 MG tablet, Take 1 tablet (100 mg total) by mouth 2 (two) times daily., Disp: 60 tablet, Rfl: 5 .  nitroGLYCERIN (NITROSTAT) 0.4 MG SL tablet, Place 1 tablet (0.4 mg total) under the tongue every 5 (five) minutes as needed for chest pain., Disp: 10 tablet, Rfl: 3 .  Prenatal Vit-Fe Fumarate-FA (PRENATAL MULTIVITAMIN) TABS tablet, Take 1 tablet by mouth daily. , Disp: , Rfl:  .  ticagrelor (BRILINTA) 90 MG TABS tablet, Take 1 tablet (90 mg total) by mouth 2 (two) times daily., Disp: 60 tablet, Rfl: 3  Past Medical History: Past Medical History:  Diagnosis Date  . Anxiety   . 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 Cough   . Cyst of brain   . Generalized headaches   . Heart attack ([place]) 04/2019  . Hyperlipidemia   . Hypertension   . Nasal congestion   . PCOS (polycystic ovarian syndrome)   . Rectal pain     Tobacco Use: Social History   Tobacco Use  Smoking Status Current Every Day Smoker  . Packs/day: 0.10  . Years: 26.00  .  Pack years: 2.60  . Types: Cigarettes  Smokeless Tobacco Never Used  Tobacco Comment   4 cigs per day     Labs: Recent Review Flowsheet Data    Labs for ITP Cardiac and Pulmonary Rehab Latest Ref Rng & Units 11/15/2017 05/09/2019 06/10/2019   Cholestrol 100 - 199 mg/dL - 240(H) 206(H)   LDLCALC 0 - 99 mg/dL - 169(H) 106(H)   HDL >39 mg/dL - 24(L) 22(L)   Trlycerides 0 - 149 mg/dL - 233(H) 389(H)   Hemoglobin A1c 4.8 - 5.6 % 5.4 5.4 -      Capillary Blood Glucose: No results found for: GLUCAP   Exercise Target Goals: Exercise Program Goal: Individual exercise prescription set using results from initial 6 min walk test and THRR while considering  patient's activity barriers and safety.   Exercise Prescription Goal: Starting with aerobic activity 30 plus minutes a day, 3 days per week for initial exercise prescription. Provide home exercise prescription and guidelines that participant acknowledges understanding prior to discharge.  Activity Barriers & Risk Stratification: Activity Barriers & Cardiac Risk Stratification - 08/05/19 1328      Activity Barriers & Cardiac Risk Stratification   Activity Barriers  Joint Problems   shoulder   Cardiac Risk Stratification  High       6 Minute Walk: 6 Minute Walk    Row  Name 08/05/19 1327         6 Minute Walk   Phase  Initial     Distance  1250 feet     Walk Time  6 minutes     # of Rest Breaks  0     MPH  2.36     METS  2.81     RPE  9     Perceived Dyspnea   6     VO2 Peak  15.44     Symptoms  No     Resting HR  78 bpm     Resting BP  130/80     Resting Oxygen Saturation   99 %     Exercise Oxygen Saturation  during 6 min walk  97 %     Max Ex. HR  100 bpm     Max Ex. BP  144/78     2 Minute Post BP  128/82        Oxygen Initial Assessment:   Oxygen Re-Evaluation:   Oxygen Discharge (Final Oxygen Re-Evaluation):   Initial Exercise Prescription: Initial Exercise Prescription - 08/05/19 1300      Date of  Initial Exercise RX and Referring Provider   Date  08/05/19    Referring Provider  Margaretann Loveless    Expected Discharge Date  11/04/19      Treadmill   MPH  2    Grade  0    Minutes  17    METs  2.53      NuStep   Level  1    SPM  70    Minutes  22    METs  2      Prescription Details   Frequency (times per week)  3    Duration  Progress to 30 minutes of continuous aerobic without signs/symptoms of physical distress      Intensity   THRR 40-80% of Max Heartrate  [phone]    Ratings of Perceived Exertion  11-15    Perceived Dyspnea  0-4      Progression   Progression  Continue to progress workloads to maintain intensity without signs/symptoms of physical distress.      Resistance Training   Training Prescription  Yes    Weight  1    Reps  10-15       Perform Capillary Blood Glucose checks as needed.  Exercise Prescription Changes:  Exercise Prescription Changes    Row Name 08/05/19 1300             Response to Exercise   Blood Pressure (Admit)  130/80       Blood Pressure (Exercise)  144/78       Blood Pressure (Exit)  128/82       Heart Rate (Admit)  78 bpm       Heart Rate (Exercise)  100 bpm       Heart Rate (Exit)  76 bpm       Oxygen Saturation (Admit)  99 %       Oxygen Saturation (Exercise)  97 %       Oxygen Saturation (Exit)  99 %       Rating of Perceived Exertion (Exercise)  9       Perceived Dyspnea (Exercise)  6       Symptoms  None       Comments  6MWT       Duration  Progress to 30 minutes of  aerobic without signs/symptoms  of physical distress       Intensity  THRR New [phone]          Exercise Comments:   Exercise Goals and Review:  Exercise Goals    Row Name 08/05/19 1330             Exercise Goals   Increase Physical Activity  Yes       Intervention  Provide advice, education, support and counseling about physical activity/exercise needs.;Develop an individualized exercise prescription for aerobic and resistive training  based on initial evaluation findings, risk stratification, comorbidities and participant's personal goals.       Expected Outcomes  Short Term: Attend rehab on a regular basis to increase amount of physical activity.;Long Term: Add in home exercise to make exercise part of routine and to increase amount of physical activity.;Long Term: Exercising regularly at least 3-5 days a week.       Increase Strength and Stamina  Yes       Intervention  Provide advice, education, support and counseling about physical activity/exercise needs.;Develop an individualized exercise prescription for aerobic and resistive training based on initial evaluation findings, risk stratification, comorbidities and participant's personal goals.       Expected Outcomes  Short Term: Increase workloads from initial exercise prescription for resistance, speed, and METs.;Short Term: Perform resistance training exercises routinely during rehab and add in resistance training at home;Long Term: Improve cardiorespiratory fitness, muscular endurance and strength as measured by increased METs and functional capacity (6MWT)       Able to understand and use rate of perceived exertion (RPE) scale  Yes       Intervention  Provide education and explanation on how to use RPE scale       Expected Outcomes  Short Term: Able to use RPE daily in rehab to express subjective intensity level;Long Term:  Able to use RPE to guide intensity level when exercising independently       Able to understand and use Dyspnea scale  Yes       Intervention  Provide education and explanation on how to use Dyspnea scale       Expected Outcomes  Short Term: Able to use Dyspnea scale daily in rehab to express subjective sense of shortness of breath during exertion;Long Term: Able to use Dyspnea scale to guide intensity level when exercising independently       Knowledge and understanding of Target Heart Rate Range (THRR)  Yes       Intervention  Provide education and  explanation of THRR including how the numbers were predicted and where they are located for reference       Expected Outcomes  Short Term: Able to state/look up THRR;Long Term: Able to use THRR to govern intensity when exercising independently;Short Term: Able to use daily as guideline for intensity in rehab       Able to check pulse independently  Yes       Intervention  Provide education and demonstration on how to check pulse in carotid and radial arteries.;Review the importance of being able to check your own pulse for safety during independent exercise       Expected Outcomes  Short Term: Able to explain why pulse checking is important during independent exercise;Long Term: Able to check pulse independently and accurately       Understanding of Exercise Prescription  Yes       Intervention  Provide education, explanation, and written materials on patient's individual exercise prescription  Expected Outcomes  Short Term: Able to explain program exercise prescription;Long Term: Able to explain home exercise prescription to exercise independently          Exercise Goals Re-Evaluation :    Discharge Exercise Prescription (Final Exercise Prescription Changes): Exercise Prescription Changes - 08/05/19 1300      Response to Exercise   Blood Pressure (Admit)  130/80    Blood Pressure (Exercise)  144/78    Blood Pressure (Exit)  128/82    Heart Rate (Admit)  78 bpm    Heart Rate (Exercise)  100 bpm    Heart Rate (Exit)  76 bpm    Oxygen Saturation (Admit)  99 %    Oxygen Saturation (Exercise)  97 %    Oxygen Saturation (Exit)  99 %    Rating of Perceived Exertion (Exercise)  9    Perceived Dyspnea (Exercise)  6    Symptoms  None    Comments  6MWT    Duration  Progress to 30 minutes of  aerobic without signs/symptoms of physical distress    Intensity  THRR New   [phone]      Nutrition:  Target Goals: Understanding of nutrition guidelines, daily intake of sodium 1500mg ,  cholesterol 200mg , calories 30% from fat and 7% or less from saturated fats, daily to have 5 or more servings of fruits and vegetables.  Biometrics: Pre Biometrics - 08/05/19 1331      Pre Biometrics   Height  5\' 9"  (1.753 m)    Waist Circumference  39 inches    Hip Circumference  42 inches    Waist to Hip Ratio  0.93 %    BMI (Calculated)  32.9    Triceps Skinfold  17 mm    % Body Fat  37.3 %    Grip Strength  2.8 kg    Single Leg Stand  32.06 seconds        Nutrition Therapy Plan and Nutrition Goals: Nutrition Therapy & Goals - 08/05/19 1434      Nutrition Therapy   RD appointment deferred  Yes      Personal Nutrition Goals   Personal Goal #2  Patient is working on eating heart healthy. Handout given on how to make heart health choices.    Additional Goals?  No       Nutrition Assessments: Nutrition Assessments - 08/05/19 1436      MEDFICTS Scores   Pre Score  39       Nutrition Goals Re-Evaluation:   Nutrition Goals Discharge (Final Nutrition Goals Re-Evaluation):   Psychosocial: Target Goals: Acknowledge presence or absence of significant depression and/or stress, maximize coping skills, provide positive support system. Participant is able to verbalize types and ability to use techniques and skills needed for reducing stress and depression.  Initial Review & Psychosocial Screening: Initial Psych Review & Screening - 08/05/19 1420      Initial Review   Current issues with  None Identified      Family Dynamics   Good Support System?  Yes      Barriers   Psychosocial barriers to participate in program  There are no identifiable barriers or psychosocial needs.      Screening Interventions   Interventions  Encouraged to exercise       Quality of Life Scores: Quality of Life - 08/05/19 1332      Quality of Life   Select  Quality of Life      Quality of Life Scores  Health/Function Pre  21.6 %    Socioeconomic Pre  19.93 %    Psych/Spiritual  Pre  21.07 %    Family Pre  24 %    GLOBAL Pre  21.5 %      Scores of 19 and below usually indicate a poorer quality of life in these areas.  A difference of  2-3 points is a clinically meaningful difference.  A difference of 2-3 points in the total score of the Quality of Life Index has been associated with significant improvement in overall quality of life, self-image, physical symptoms, and general health in studies assessing change in quality of life.  PHQ-9: Recent Review Flowsheet Data    Depression screen [facility] 2/9 08/05/2019 06/10/2019 05/16/2019 05/16/2018 04/05/2018   Decreased Interest 0 0 0 0 0   Down, Depressed, Hopeless 0 0 0 1 0   PHQ - 2 Score 0 0 0 1 0   Altered sleeping 0 - 0 - -   Tired, decreased energy 1 - 1 - -   Change in appetite 0 - 0 - -   Feeling bad or failure about yourself  0 - 0 - -   Trouble concentrating 0 - 0 - -   Moving slowly or fidgety/restless 0 - 0 - -   Suicidal thoughts 0 - 0 - -   PHQ-9 Score 1 - 1 - -   Difficult doing work/chores Somewhat difficult - Not difficult at all - -     Interpretation of Total Score  Total Score Depression Severity:  1-4 = Minimal depression, 5-9 = Mild depression, 10-14 = Moderate depression, 15-19 = Moderately severe depression, 20-27 = Severe depression   Psychosocial Evaluation and Intervention: Psychosocial Evaluation - 08/05/19 1434      Psychosocial Evaluation & Interventions   Interventions  Encouraged to exercise with the program and follow exercise prescription    Continue Psychosocial Services   No Follow up required       Psychosocial Re-Evaluation:   Psychosocial Discharge (Final Psychosocial Re-Evaluation):   Vocational Rehabilitation: Provide vocational rehab assistance to qualifying candidates.   Vocational Rehab Evaluation & Intervention: Vocational Rehab - 08/05/19 1437      Initial Vocational Rehab Evaluation & Intervention   Assessment shows need for Vocational Rehabilitation  No        Education: Education Goals: Education classes will be provided on a weekly basis, covering required topics. Participant will state understanding/return demonstration of topics presented.  Learning Barriers/Preferences: Learning Barriers/Preferences - 08/05/19 1436      Learning Barriers/Preferences   Learning Barriers  None    Learning Preferences  Audio;Computer/Internet;Group Instruction;Individual Instruction;Pictoral;Skilled Demonstration;Verbal Instruction;Video;Written Material       Education Topics: Hypertension, Hypertension Reduction -Define heart disease and high blood pressure. Discus how high blood pressure affects the body and ways to reduce high blood pressure.   Exercise and Your Heart -Discuss why it is important to exercise, the FITT principles of exercise, normal and abnormal responses to exercise, and how to exercise safely.   Angina -Discuss definition of angina, causes of angina, treatment of angina, and how to decrease risk of having angina.   Cardiac Medications -Review what the following cardiac medications are used for, how they affect the body, and side effects that may occur when taking the medications.  Medications include Aspirin, Beta blockers, calcium channel blockers, ACE Inhibitors, angiotensin receptor blockers, diuretics, digoxin, and antihyperlipidemics.   Congestive Heart Failure -Discuss the definition of CHF, how to live with  CHF, the signs and symptoms of CHF, and how keep track of weight and sodium intake.   Heart Disease and Intimacy -Discus the effect sexual activity has on the heart, how changes occur during intimacy as we age, and safety during sexual activity.   Smoking Cessation / COPD -Discuss different methods to quit smoking, the health benefits of quitting smoking, and the definition of COPD.   Nutrition I: Fats -Discuss the types of cholesterol, what cholesterol does to the heart, and how cholesterol levels can be  controlled.   Nutrition II: Labels -Discuss the different components of food labels and how to read food label   Heart Parts/Heart Disease and PAD -Discuss the anatomy of the heart, the pathway of blood circulation through the heart, and these are affected by heart disease.   Stress I: Signs and Symptoms -Discuss the causes of stress, how stress may lead to anxiety and depression, and ways to limit stress.   Stress II: Relaxation -Discuss different types of relaxation techniques to limit stress.   Warning Signs of Stroke / TIA -Discuss definition of a stroke, what the signs and symptoms are of a stroke, and how to identify when someone is having stroke.   Knowledge Questionnaire Score: Knowledge Questionnaire Score - 08/05/19 1437      Knowledge Questionnaire Score   Pre Score  26/28       Core Components/Risk Factors/Patient Goals at Admission: Personal Goals and Risk Factors at Admission - 08/05/19 1437      Core Components/Risk Factors/Patient Goals on Admission    Weight Management  Weight Maintenance    Personal Goal Other  Yes    Personal Goal  Gain energy, gain strength. Cut back on smoking, want to exercise more.    Intervention  Attend CR 3 x week and supplement 2 x week with home exercise    Expected Outcomes  Reach goals       Core Components/Risk Factors/Patient Goals Review:    Core Components/Risk Factors/Patient Goals at Discharge (Final Review):    ITP Comments: ITP Comments    Row Name 08/05/19 1415           ITP Comments  Patient has come back to finish her orientation.          Comments: Patient arrived for 1st visit/orientation/education at 1230.  Patient was referred to CR by Dr. Margaretann Loveless due to NSTEMI (I21.4) and Stent placement (Z95.5). During orientation advised patient on arrival and appointment times what to wear, what to do before, during and after exercise. Reviewed attendance and class policy. Talked about inclement weather and  class consultation policy. Pt is scheduled to return Cardiac Rehab on 08/09/19 at 0815. Pt was advised to come to class 15 minutes before class starts. Patient was also given instructions on meeting with the dietician and attending the Family Structure classes. Discussed RPE/Dpysnea scales. Discussed initial THR and how to find their radial and/or carotid pulse. Discussed the initial exercise prescription and how this effects their progress. Pt is eager to get started. Patient participated in warm up stretches followed by light weights and resistance bands. Patient was able to complete 6 minute walk test. Patient did not c/o any pain during walk test. Patient was measured for the equipment. Discussed equipment safety with patient. Took patient pre-anthropometric measurements. Patient finished visit at 1345.

## 2019-08-05 NOTE — Progress Notes (Signed)
Daily Session Note  Patient Details  Name: Kaitlin Torres MRN: [id number] Date of Birth: Jan 01, 1981 Referring Provider:     [facility] from 08/05/2019 in [place]  Referring Provider  Margaretann Loveless      Encounter Date: 08/05/2019  Check In: Session Check In - 08/05/19 1415      Check-In   Supervising physician immediately available to respond to emergencies  See telemetry face sheet for immediately available MD    Location  [facility]    Staff Present  Russella Dar, MS, EP, [facility], Exercise Physiologist;Debra Wynetta Emery, RN, [facility] Munch, Exercise Physiologist    Virtual Visit  No    Medication changes reported      No    Fall or balance concerns reported     No    Tobacco Cessation  Use Decreased   Still smoking 1 ppd   Warm-up and Cool-down  Performed as group-led instruction    Resistance Training Performed  Yes    VAD Patient?  No    PAD/SET Patient?  No      Pain Assessment   Currently in Pain?  No/denies    Pain Score  0-No pain    Multiple Pain Sites  No       Capillary Blood Glucose: No results found for this or any previous visit (from the past 24 hour(s)).  Exercise Prescription Changes - 08/05/19 1300      Response to Exercise   Blood Pressure (Admit)  130/80    Blood Pressure (Exercise)  144/78    Blood Pressure (Exit)  128/82    Heart Rate (Admit)  78 bpm    Heart Rate (Exercise)  100 bpm    Heart Rate (Exit)  76 bpm    Oxygen Saturation (Admit)  99 %    Oxygen Saturation (Exercise)  97 %    Oxygen Saturation (Exit)  99 %    Rating of Perceived Exertion (Exercise)  9    Perceived Dyspnea (Exercise)  6    Symptoms  None    Comments  6MWT    Duration  Progress to 30 minutes of  aerobic without signs/symptoms of physical distress    Intensity  THRR New   [phone]      Social History   Tobacco Use  Smoking Status Current Every Day Smoker  . Packs/day: 0.10  . Years: 26.00  . Pack  years: 2.60  . Types: Cigarettes  Smokeless Tobacco Never Used  Tobacco Comment   4 cigs per day     Goals Met:  Independence with exercise equipment Exercise tolerated well Personal goals reviewed No report of cardiac concerns or symptoms Strength training completed today  Goals Unmet:  Not Applicable  Comments: Check out 1345   Dr. Kate Sable is Medical Director for [place] and Pulmonary Rehab.

## 2019-08-06 ENCOUNTER — Ambulatory Visit: Payer: Medicaid Other | Admitting: Physical Therapy

## 2019-08-06 ENCOUNTER — Encounter: Payer: Self-pay | Admitting: Physical Therapy

## 2019-08-06 DIAGNOSIS — M25512 Pain in left shoulder: Secondary | ICD-10-CM

## 2019-08-06 DIAGNOSIS — M6281 Muscle weakness (generalized): Secondary | ICD-10-CM | POA: Diagnosis not present

## 2019-08-06 DIAGNOSIS — R293 Abnormal posture: Secondary | ICD-10-CM | POA: Diagnosis not present
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
## 2019-08-09 ENCOUNTER — Encounter (HOSPITAL_COMMUNITY): Payer: Medicaid Other

## 2019-08-12 ENCOUNTER — Other Ambulatory Visit: Payer: Self-pay

## 2019-08-12 ENCOUNTER — Encounter (HOSPITAL_COMMUNITY)
Admission: RE | Admit: 2019-08-12 | Discharge: 2019-08-12 | Disposition: A | Discharge status: Home / Self Care | Payer: Medicaid Other | Source: Ambulatory Visit | Attending: Internal Medicine | Admitting: Internal Medicine

## 2019-08-12 DIAGNOSIS — I214 Non-ST elevation (NSTEMI) myocardial infarction: Secondary | ICD-10-CM | POA: Diagnosis not present

## 2019-08-12 DIAGNOSIS — Z955 Presence of coronary angioplasty implant and graft: Secondary | ICD-10-CM | POA: Diagnosis not present

## 2019-08-12 NOTE — Progress Notes (Signed)
Daily Session Note  Patient Details  Name: ARALI SOMERA MRN: [id number] Date of Birth: [birth year] Referring Provider:     [place] from 08/05/2019 in [place]  Referring Provider  Margaretann Loveless      Encounter Date: 08/12/2019  Check In: Session Check In - 08/12/19 0815      Check-In   Supervising physician immediately available to respond to emergencies  See telemetry face sheet for immediately available MD    Location  [facility]    Staff Present  Benay Pike, Exercise Physiologist;Debra Wynetta Emery, RN, BSN;Other    Virtual Visit  No    Medication changes reported      No    Fall or balance concerns reported     No    Tobacco Cessation  No Change    Warm-up and Cool-down  Performed as group-led instruction    Resistance Training Performed  Yes    VAD Patient?  No    PAD/SET Patient?  No      Pain Assessment   Currently in Pain?  No/denies    Pain Score  0-No pain    Multiple Pain Sites  No       Capillary Blood Glucose: No results found for this or any previous visit (from the past 24 hour(s)).    Social History   Tobacco Use  Smoking Status Current Every Day Smoker  . Packs/day: 0.10  . Years: 26.00  . Pack years: 2.60  . Types: Cigarettes  Smokeless Tobacco Never Used  Tobacco Comment   4 cigs per day     Goals Met:  Independence with exercise equipment Exercise tolerated well No report of cardiac concerns or symptoms Strength training completed today  Goals Unmet:  Not Applicable  Comments: Pt able to follow exercise prescription today without complaint.  Will continue to monitor for progression. Check out 0915.   Dr. Kate Sable is Medical Director for [facility].

## 2019-08-14 ENCOUNTER — Encounter: Payer: Self-pay | Admitting: Family Medicine

## 2019-08-14 ENCOUNTER — Encounter (HOSPITAL_COMMUNITY): Payer: Medicaid Other

## 2019-08-14 ENCOUNTER — Ambulatory Visit (INDEPENDENT_AMBULATORY_CARE_PROVIDER_SITE_OTHER): Payer: Medicaid Other | Admitting: Family Medicine

## 2019-08-14 ENCOUNTER — Telehealth: Payer: Self-pay | Admitting: Family Medicine

## 2019-08-14 DIAGNOSIS — H00014 Hordeolum externum left upper eyelid: Secondary | ICD-10-CM | POA: Diagnosis not present

## 2019-08-14 DIAGNOSIS — J40 Bronchitis, not specified as acute or chronic: Secondary | ICD-10-CM

## 2019-08-14 DIAGNOSIS — J329 Chronic sinusitis, unspecified: Secondary | ICD-10-CM | POA: Diagnosis not present

## 2019-08-14 MED ORDER — TOBRAMYCIN-DEXAMETHASONE 0.3-0.1 % OP SUSP: OPHTHALMIC | 0 refills | Status: DC

## 2019-08-14 MED ORDER — AMOXICILLIN-POT CLAVULANATE 875-125 MG PO TABS: 1.0000 | ORAL_TABLET | Freq: Two times a day (BID) | ORAL | 0 refills | Status: DC

## 2019-08-14 NOTE — Progress Notes (Signed)
Subjective:    Patient ID: Kaitlin Torres, female    DOB: 1981/06/07, 38 y.o.   MRN: [id number]   HPI: Kaitlin Torres is a 38 y.o. female presenting for Symptoms include congestion, facial pain, nasal congestion,  productive cough, post nasal drip and sinus pressure. There is no fever, chills, or sweats. Onset of symptoms was yesterday, gradually worsening since that time.  Left upper eyelid also swollen red, itchy.   Depression screen [facility] 2/9 08/05/2019 06/10/2019 05/16/2019 05/16/2018 04/05/2018  Decreased Interest 0 0 0 0 0  Down, Depressed, Hopeless 0 0 0 1 0  PHQ - 2 Score 0 0 0 1 0  Altered sleeping 0 - 0 - -  Tired, decreased energy 1 - 1 - -  Change in appetite 0 - 0 - -  Feeling bad or failure about yourself  0 - 0 - -  Trouble concentrating 0 - 0 - -  Moving slowly or fidgety/restless 0 - 0 - -  Suicidal thoughts 0 - 0 - -  PHQ-9 Score 1 - 1 - -  Difficult doing work/chores Somewhat difficult - Not difficult at all - -     Relevant past medical, surgical, family and social history reviewed and updated as indicated.  Interim medical history since our last visit reviewed. Allergies and medications reviewed and updated.  ROS:  Review of Systems  nml except as in HPI Social History   Tobacco Use  Smoking Status Current Every Day Smoker  . Packs/day: 0.10  . Years: 26.00  . Pack years: 2.60  . Types: Cigarettes  Smokeless Tobacco Never Used  Tobacco Comment   4 cigs per day        Objective:     Wt Readings from Last 3 Encounters:  08/05/19 222 lb 14.4 oz (101.1 kg)  07/04/19 231 lb (104.8 kg)  06/10/19 230 lb (104.3 kg)     Exam deferred. Pt. Harboring due to COVID 19. Phone visit performed.   Assessment & Plan:   1. Sinobronchitis   2. Hordeolum externum of left upper eyelid     Meds ordered this encounter  Medications  . amoxicillin-clavulanate (AUGMENTIN) 875-125 MG tablet    Sig: Take 1 tablet by mouth 2 (two) times daily. Take all of this  medication    Dispense:  20 tablet    Refill:  0  . tobramycin-dexamethasone (TOBRADEX) ophthalmic solution    Sig: Apply 1 drop in affected eye(s) every 2 hours for two days. Then every 4 hours for 5 days.    Dispense:  5 mL    Refill:  0    No orders of the defined types were placed in this encounter.     Diagnoses and all orders for this visit:  Sinobronchitis  Hordeolum externum of left upper eyelid  Other orders -     amoxicillin-clavulanate (AUGMENTIN) 875-125 MG tablet; Take 1 tablet by mouth 2 (two) times daily. Take all of this medication -     tobramycin-dexamethasone (TOBRADEX) ophthalmic solution; Apply 1 drop in affected eye(s) every 2 hours for two days. Then every 4 hours for 5 days.    Virtual Visit via telephone Note  I discussed the limitations, risks, security and privacy concerns of performing an evaluation and management service by telephone and the availability of in person appointments. The patient was identified with two identifiers. Pt.expressed understanding and agreed to proceed. Pt. Is at home. Dr. Livia Snellen is in his office.  Follow Up  Instructions:   I discussed the assessment and treatment plan with the patient. The patient was provided an opportunity to ask questions and all were answered. The patient agreed with the plan and demonstrated an understanding of the instructions.   The patient was advised to call back or seek an in-person evaluation if the symptoms worsen or if the condition fails to improve as anticipated.   Total minutes including chart review and phone contact time: 8   Follow up plan: Return if symptoms worsen or fail to improve.  Claretta Fraise, MD [place]

## 2019-08-14 NOTE — Telephone Encounter (Signed)
How long does patient need to be out of work?

## 2019-08-16 ENCOUNTER — Encounter (HOSPITAL_COMMUNITY): Payer: Medicaid Other

## 2019-08-16 NOTE — Telephone Encounter (Signed)
Please give her a note to return to work on Monday.

## 2019-08-16 NOTE — Telephone Encounter (Signed)
Letter written and left detailed message on patients voicemail. Left copy up front for pick up

## 2019-08-19 ENCOUNTER — Encounter (HOSPITAL_COMMUNITY): Payer: Medicaid Other

## 2019-08-21 ENCOUNTER — Encounter (HOSPITAL_COMMUNITY): Payer: Medicaid Other

## 2019-08-21 NOTE — Progress Notes (Signed)
Cardiac Individual Treatment Plan  Patient Details  Name: Kaitlin Torres MRN: [id number] Date of Birth: 12/07/1980 Referring Provider:     CARDIAC REHAB PHASE II ORIENTATION from 08/05/2019 in [place]  Referring Provider  Margaretann Loveless      Initial Encounter Date:    CARDIAC REHAB PHASE II ORIENTATION from 08/05/2019 in [place]  Date  08/05/19      Visit Diagnosis: NSTEMI (non-ST elevated myocardial infarction) [facility])  Status post coronary artery stent placement  Patient's Home Medications on Admission:  Current Outpatient Medications:  .  amoxicillin-clavulanate (AUGMENTIN) 875-125 MG tablet, Take 1 tablet by mouth 2 (two) times daily. Take all of this medication, Disp: 20 tablet, Rfl: 0 .  aspirin 81 MG EC tablet, Take 1 tablet (81 mg total) by mouth daily., Disp: 30 tablet, Rfl: 3 .  clomiPHENE (CLOMID) 50 MG tablet, Take 50 mg by mouth as directed. Take 2 tablet by mouth once daily for 5 days(day 3-7 on cycle), Disp: , Rfl:  .  fluconazole (DIFLUCAN) 150 MG tablet, Take 150 mg by mouth as directed. For yeast infection, Disp: , Rfl:  .  labetalol (NORMODYNE) 100 MG tablet, Take 1 tablet (100 mg total) by mouth 2 (two) times daily., Disp: 60 tablet, Rfl: 5 .  nitroGLYCERIN (NITROSTAT) 0.4 MG SL tablet, Place 1 tablet (0.4 mg total) under the tongue every 5 (five) minutes as needed for chest pain., Disp: 10 tablet, Rfl: 3 .  Prenatal Vit-Fe Fumarate-FA (PRENATAL MULTIVITAMIN) TABS tablet, Take 1 tablet by mouth daily. , Disp: , Rfl:  .  ticagrelor (BRILINTA) 90 MG TABS tablet, Take 1 tablet (90 mg total) by mouth 2 (two) times daily., Disp: 60 tablet, Rfl: 3 .  tobramycin-dexamethasone (TOBRADEX) ophthalmic solution, Apply 1 drop in affected eye(s) every 2 hours for two days. Then every 4 hours for 5 days., Disp: 5 mL, Rfl: 0  Past Medical History: Past Medical History:  Diagnosis Date  . Anxiety   . 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 Cough   .  Cyst of brain   . Generalized headaches   . Heart attack ([place]) 04/2019  . Hyperlipidemia   . Hypertension   . Nasal congestion   . PCOS (polycystic ovarian syndrome)   . Rectal pain     Tobacco Use: Social History   Tobacco Use  Smoking Status Current Every Day Smoker  . Packs/day: 0.10  . Years: 26.00  . Pack years: 2.60  . Types: Cigarettes  Smokeless Tobacco Never Used  Tobacco Comment   4 cigs per day     Labs: Recent Review Flowsheet Data    Labs for ITP Cardiac and Pulmonary Rehab Latest Ref Rng & Units 11/15/2017 05/09/2019 06/10/2019   Cholestrol 100 - 199 mg/dL - 240(H) 206(H)   LDLCALC 0 - 99 mg/dL - 169(H) 106(H)   HDL >39 mg/dL - 24(L) 22(L)   Trlycerides 0 - 149 mg/dL - 233(H) 389(H)   Hemoglobin A1c 4.8 - 5.6 % 5.4 5.4 -      Capillary Blood Glucose: No results found for: GLUCAP   Exercise Target Goals: Exercise Program Goal: Individual exercise prescription set using results from initial 6 min walk test and THRR while considering  patient's activity barriers and safety.   Exercise Prescription Goal: Starting with aerobic activity 30 plus minutes a day, 3 days per week for initial exercise prescription. Provide home exercise prescription and guidelines that participant acknowledges understanding prior to discharge.  Activity Barriers &  Risk Stratification: Activity Barriers & Cardiac Risk Stratification - 08/05/19 1328      Activity Barriers & Cardiac Risk Stratification   Activity Barriers  Joint Problems   shoulder   Cardiac Risk Stratification  High       6 Minute Walk: 6 Minute Walk    Row Name 08/05/19 1327         6 Minute Walk   Phase  Initial     Distance  1250 feet     Walk Time  6 minutes     # of Rest Breaks  0     MPH  2.36     METS  2.81     RPE  9     Perceived Dyspnea   6     VO2 Peak  15.44     Symptoms  No     Resting HR  78 bpm     Resting BP  130/80     Resting Oxygen Saturation   99 %     Exercise Oxygen  Saturation  during 6 min walk  97 %     Max Ex. HR  100 bpm     Max Ex. BP  144/78     2 Minute Post BP  128/82        Oxygen Initial Assessment:   Oxygen Re-Evaluation:   Oxygen Discharge (Final Oxygen Re-Evaluation):   Initial Exercise Prescription: Initial Exercise Prescription - 08/05/19 1300      Date of Initial Exercise RX and Referring Provider   Date  08/05/19    Referring Provider  Margaretann Loveless    Expected Discharge Date  11/04/19      Treadmill   MPH  2    Grade  0    Minutes  17    METs  2.53      NuStep   Level  1    SPM  70    Minutes  22    METs  2      Prescription Details   Frequency (times per week)  3    Duration  Progress to 30 minutes of continuous aerobic without signs/symptoms of physical distress      Intensity   THRR 40-80% of Max Heartrate  [phone]    Ratings of Perceived Exertion  11-15    Perceived Dyspnea  0-4      Progression   Progression  Continue to progress workloads to maintain intensity without signs/symptoms of physical distress.      Resistance Training   Training Prescription  Yes    Weight  1    Reps  10-15       Perform Capillary Blood Glucose checks as needed.  Exercise Prescription Changes:  Exercise Prescription Changes    Row Name 08/05/19 1300 08/15/19 1000           Response to Exercise   Blood Pressure (Admit)  130/80  122/82      Blood Pressure (Exercise)  144/78  152/78      Blood Pressure (Exit)  128/82  118/72      Heart Rate (Admit)  78 bpm  85 bpm      Heart Rate (Exercise)  100 bpm  101 bpm      Heart Rate (Exit)  76 bpm  97 bpm      Oxygen Saturation (Admit)  99 %  -      Oxygen Saturation (Exercise)  97 %  -  Oxygen Saturation (Exit)  99 %  -      Rating of Perceived Exertion (Exercise)  9  10      Perceived Dyspnea (Exercise)  6  -      Symptoms  None  None      Comments  6MWT  first two weeks of exercise       Duration  Progress to 30 minutes of  aerobic without signs/symptoms  of physical distress  Continue with 30 min of aerobic exercise without signs/symptoms of physical distress.      Intensity  THRR New 120-141-162  THRR unchanged        Progression   Progression  -  Continue to progress workloads to maintain intensity without signs/symptoms of physical distress.      Average METs  -  3.05        Resistance Training   Training Prescription  -  Yes      Weight  -  1 this is only the second session       Reps  -  10-15        Treadmill   MPH  -  1.7      Grade  -  0      Minutes  -  22      METs  -  2.3        Recumbant Elliptical   Level  -  1      RPM  -  56      Watts  -  64      Minutes  -  17      METs  -  3.8        Home Exercise Plan   Plans to continue exercise at  -  Home (comment)      Frequency  -  Add 2 additional days to program exercise sessions.         Exercise Comments:  Exercise Comments    Row Name 08/15/19 1012 08/20/19 1313         Exercise Comments  Pt. has now attended 2 sessions. Her attendance is spotty. She has tolerated the exercise well so far.  [place] has still only attended 2 sessions, one being her orientation. On her first and only exercise session she handled the exercise with ease and was able to complete the entire 35 minutes of aerobic exercise. When she returns we will adjust her prescription if needed and will go from there.         Exercise Goals and Review:  Exercise Goals    Row Name 08/05/19 1330             Exercise Goals   Increase Physical Activity  Yes       Intervention  Provide advice, education, support and counseling about physical activity/exercise needs.;Develop an individualized exercise prescription for aerobic and resistive training based on initial evaluation findings, risk stratification, comorbidities and participant's personal goals.       Expected Outcomes  Short Term: Attend rehab on a regular basis to increase amount of physical activity.;Long Term: Add in home exercise to  make exercise part of routine and to increase amount of physical activity.;Long Term: Exercising regularly at least 3-5 days a week.       Increase Strength and Stamina  Yes       Intervention  Provide advice, education, support and counseling about physical activity/exercise needs.;Develop an individualized exercise prescription for aerobic and resistive  training based on initial evaluation findings, risk stratification, comorbidities and participant's personal goals.       Expected Outcomes  Short Term: Increase workloads from initial exercise prescription for resistance, speed, and METs.;Short Term: Perform resistance training exercises routinely during rehab and add in resistance training at home;Long Term: Improve cardiorespiratory fitness, muscular endurance and strength as measured by increased METs and functional capacity (6MWT)       Able to understand and use rate of perceived exertion (RPE) scale  Yes       Intervention  Provide education and explanation on how to use RPE scale       Expected Outcomes  Short Term: Able to use RPE daily in rehab to express subjective intensity level;Long Term:  Able to use RPE to guide intensity level when exercising independently       Able to understand and use Dyspnea scale  Yes       Intervention  Provide education and explanation on how to use Dyspnea scale       Expected Outcomes  Short Term: Able to use Dyspnea scale daily in rehab to express subjective sense of shortness of breath during exertion;Long Term: Able to use Dyspnea scale to guide intensity level when exercising independently       Knowledge and understanding of Target Heart Rate Range (THRR)  Yes       Intervention  Provide education and explanation of THRR including how the numbers were predicted and where they are located for reference       Expected Outcomes  Short Term: Able to state/look up THRR;Long Term: Able to use THRR to govern intensity when exercising independently;Short Term:  Able to use daily as guideline for intensity in rehab       Able to check pulse independently  Yes       Intervention  Provide education and demonstration on how to check pulse in carotid and radial arteries.;Review the importance of being able to check your own pulse for safety during independent exercise       Expected Outcomes  Short Term: Able to explain why pulse checking is important during independent exercise;Long Term: Able to check pulse independently and accurately       Understanding of Exercise Prescription  Yes       Intervention  Provide education, explanation, and written materials on patient's individual exercise prescription       Expected Outcomes  Short Term: Able to explain program exercise prescription;Long Term: Able to explain home exercise prescription to exercise independently          Exercise Goals Re-Evaluation : Exercise Goals Re-Evaluation    [place] Name 08/20/19 1311             Exercise Goal Re-Evaluation   Exercise Goals Review  Increase Physical Activity;Increase Strength and Stamina;Able to understand and use rate of perceived exertion (RPE) scale;Able to understand and use Dyspnea scale;Knowledge and understanding of Target Heart Rate Range (THRR);Able to check pulse independently;Understanding of Exercise Prescription       Comments  Pt. is new to the program. Her orientation was on 9/28 and she has only attended one exercise session since then. She tolerated the exercise well that day, but upon return will reassess to adjust her prescription as needed.       Expected Outcomes  Short: increase stength and energy levels Long: develop exercise routine to remain active at home, cut back on smoking.  Discharge Exercise Prescription (Final Exercise Prescription Changes): Exercise Prescription Changes - 08/15/19 1000      Response to Exercise   Blood Pressure (Admit)  122/82    Blood Pressure (Exercise)  152/78    Blood Pressure (Exit)  118/72     Heart Rate (Admit)  85 bpm    Heart Rate (Exercise)  101 bpm    Heart Rate (Exit)  97 bpm    Rating of Perceived Exertion (Exercise)  10    Symptoms  None    Comments  first two weeks of exercise     Duration  Continue with 30 min of aerobic exercise without signs/symptoms of physical distress.    Intensity  THRR unchanged      Progression   Progression  Continue to progress workloads to maintain intensity without signs/symptoms of physical distress.    Average METs  3.05      Resistance Training   Training Prescription  Yes    Weight  1   this is only the second session    Reps  10-15      Treadmill   MPH  1.7    Grade  0    Minutes  22    METs  2.3      Recumbant Elliptical   Level  1    RPM  56    Watts  64    Minutes  17    METs  3.8      Home Exercise Plan   Plans to continue exercise at  Home (comment)    Frequency  Add 2 additional days to program exercise sessions.       Nutrition:  Target Goals: Understanding of nutrition guidelines, daily intake of sodium 1500mg , cholesterol 200mg , calories 30% from fat and 7% or less from saturated fats, daily to have 5 or more servings of fruits and vegetables.  Biometrics: Pre Biometrics - 08/05/19 1331      Pre Biometrics   Height  5\' 9"  (1.753 m)    Waist Circumference  39 inches    Hip Circumference  42 inches    Waist to Hip Ratio  0.93 %    BMI (Calculated)  32.9    Triceps Skinfold  17 mm    % Body Fat  37.3 %    Grip Strength  2.8 kg    Single Leg Stand  32.06 seconds        Nutrition Therapy Plan and Nutrition Goals: Nutrition Therapy & Goals - 08/05/19 1434      Nutrition Therapy   RD appointment deferred  Yes      Personal Nutrition Goals   Personal Goal #2  Patient is working on eating heart healthy. Handout given on how to make heart health choices.    Additional Goals?  No       Nutrition Assessments: Nutrition Assessments - 08/05/19 1436      MEDFICTS Scores   Pre Score  39        Nutrition Goals Re-Evaluation:   Nutrition Goals Discharge (Final Nutrition Goals Re-Evaluation):   Psychosocial: Target Goals: Acknowledge presence or absence of significant depression and/or stress, maximize coping skills, provide positive support system. Participant is able to verbalize types and ability to use techniques and skills needed for reducing stress and depression.  Initial Review & Psychosocial Screening: Initial Psych Review & Screening - 08/05/19 1420      Initial Review   Current issues with  None Identified  Family Dynamics   Good Support System?  Yes      Barriers   Psychosocial barriers to participate in program  There are no identifiable barriers or psychosocial needs.      Screening Interventions   Interventions  Encouraged to exercise       Quality of Life Scores: Quality of Life - 08/05/19 1332      Quality of Life   Select  Quality of Life      Quality of Life Scores   Health/Function Pre  21.6 %    Socioeconomic Pre  19.93 %    Psych/Spiritual Pre  21.07 %    Family Pre  24 %    GLOBAL Pre  21.5 %      Scores of 19 and below usually indicate a poorer quality of life in these areas.  A difference of  2-3 points is a clinically meaningful difference.  A difference of 2-3 points in the total score of the Quality of Life Index has been associated with significant improvement in overall quality of life, self-image, physical symptoms, and general health in studies assessing change in quality of life.  PHQ-9: Recent Review Flowsheet Data    Depression screen [facility] 2/9 08/05/2019 06/10/2019 05/16/2019 05/16/2018 04/05/2018   Decreased Interest 0 0 0 0 0   Down, Depressed, Hopeless 0 0 0 1 0   PHQ - 2 Score 0 0 0 1 0   Altered sleeping 0 - 0 - -   Tired, decreased energy 1 - 1 - -   Change in appetite 0 - 0 - -   Feeling bad or failure about yourself  0 - 0 - -   Trouble concentrating 0 - 0 - -   Moving slowly or fidgety/restless 0 - 0 - -    Suicidal thoughts 0 - 0 - -   PHQ-9 Score 1 - 1 - -   Difficult doing work/chores Somewhat difficult - Not difficult at all - -     Interpretation of Total Score  Total Score Depression Severity:  1-4 = Minimal depression, 5-9 = Mild depression, 10-14 = Moderate depression, 15-19 = Moderately severe depression, 20-27 = Severe depression   Psychosocial Evaluation and Intervention: Psychosocial Evaluation - 08/05/19 1434      Psychosocial Evaluation & Interventions   Interventions  Encouraged to exercise with the program and follow exercise prescription    Continue Psychosocial Services   No Follow up required       Psychosocial Re-Evaluation:   Psychosocial Discharge (Final Psychosocial Re-Evaluation):   Vocational Rehabilitation: Provide vocational rehab assistance to qualifying candidates.   Vocational Rehab Evaluation & Intervention: Vocational Rehab - 08/05/19 1437      Initial Vocational Rehab Evaluation & Intervention   Assessment shows need for Vocational Rehabilitation  No       Education: Education Goals: Education classes will be provided on a weekly basis, covering required topics. Participant will state understanding/return demonstration of topics presented.  Learning Barriers/Preferences: Learning Barriers/Preferences - 08/05/19 1436      Learning Barriers/Preferences   Learning Barriers  None    Learning Preferences  Audio;Computer/Internet;Group Instruction;Individual Instruction;Pictoral;Skilled Demonstration;Verbal Instruction;Video;Written Material       Education Topics: Hypertension, Hypertension Reduction -Define heart disease and high blood pressure. Discus how high blood pressure affects the body and ways to reduce high blood pressure.   Exercise and Your Heart -Discuss why it is important to exercise, the FITT principles of exercise, normal and abnormal responses to  exercise, and how to exercise safely.   Angina -Discuss definition of  angina, causes of angina, treatment of angina, and how to decrease risk of having angina.   Cardiac Medications -Review what the following cardiac medications are used for, how they affect the body, and side effects that may occur when taking the medications.  Medications include Aspirin, Beta blockers, calcium channel blockers, ACE Inhibitors, angiotensin receptor blockers, diuretics, digoxin, and antihyperlipidemics.   Congestive Heart Failure -Discuss the definition of CHF, how to live with CHF, the signs and symptoms of CHF, and how keep track of weight and sodium intake.   Heart Disease and Intimacy -Discus the effect sexual activity has on the heart, how changes occur during intimacy as we age, and safety during sexual activity.   Smoking Cessation / COPD -Discuss different methods to quit smoking, the health benefits of quitting smoking, and the definition of COPD.   Nutrition I: Fats -Discuss the types of cholesterol, what cholesterol does to the heart, and how cholesterol levels can be controlled.   Nutrition II: Labels -Discuss the different components of food labels and how to read food label   Heart Parts/Heart Disease and PAD -Discuss the anatomy of the heart, the pathway of blood circulation through the heart, and these are affected by heart disease.   Stress I: Signs and Symptoms -Discuss the causes of stress, how stress may lead to anxiety and depression, and ways to limit stress.   Stress II: Relaxation -Discuss different types of relaxation techniques to limit stress.   Warning Signs of Stroke / TIA -Discuss definition of a stroke, what the signs and symptoms are of a stroke, and how to identify when someone is having stroke.   Knowledge Questionnaire Score: Knowledge Questionnaire Score - 08/05/19 1437      Knowledge Questionnaire Score   Pre Score  26/28       Core Components/Risk Factors/Patient Goals at Admission: Personal Goals and Risk  Factors at Admission - 08/05/19 1437      Core Components/Risk Factors/Patient Goals on Admission    Weight Management  Weight Maintenance    Personal Goal Other  Yes    Personal Goal  Gain energy, gain strength. Cut back on smoking, want to exercise more.    Intervention  Attend CR 3 x week and supplement 2 x week with home exercise    Expected Outcomes  Reach goals       Core Components/Risk Factors/Patient Goals Review:    Core Components/Risk Factors/Patient Goals at Discharge (Final Review):    ITP Comments: ITP Comments    Row Name 08/05/19 1415 08/21/19 1557         ITP Comments  Patient has come back to finish her orientation.  Patient has completed 2 sessions. She is currently out with a sinus infection. She has to be asymptomatic before she can return to the program. Will continue to monitor.         Comments: ITP REVIEW Patient has completed 2 sessions. She is currently out with a sinus infection. She has to be asymptomatic before she can return to the program. Will continue to monitor.

## 2019-08-23 ENCOUNTER — Encounter (HOSPITAL_COMMUNITY): Payer: Medicaid Other

## 2019-08-26 ENCOUNTER — Encounter (HOSPITAL_COMMUNITY): Payer: Medicaid Other

## 2019-08-28 ENCOUNTER — Encounter (HOSPITAL_COMMUNITY): Payer: Medicaid Other

## 2019-08-30 ENCOUNTER — Encounter (HOSPITAL_COMMUNITY): Payer: Medicaid Other

## 2019-09-02 ENCOUNTER — Encounter (HOSPITAL_COMMUNITY): Payer: Medicaid Other

## 2019-09-04 ENCOUNTER — Emergency Department (HOSPITAL_COMMUNITY)
Admission: EM | Admit: 2019-09-04 | Discharge: 2019-09-04 | Disposition: A | Discharge status: Home / Self Care | Payer: Medicaid Other | Attending: Emergency Medicine | Admitting: Emergency Medicine

## 2019-09-04 ENCOUNTER — Other Ambulatory Visit: Payer: Self-pay

## 2019-09-04 ENCOUNTER — Encounter (HOSPITAL_COMMUNITY): Payer: Medicaid Other

## 2019-09-04 ENCOUNTER — Encounter (HOSPITAL_COMMUNITY): Payer: Self-pay | Admitting: Emergency Medicine

## 2019-09-04 DIAGNOSIS — Z7982 Long term (current) use of aspirin: Secondary | ICD-10-CM | POA: Diagnosis not present

## 2019-09-04 DIAGNOSIS — M545 Low back pain, unspecified: Secondary | ICD-10-CM

## 2019-09-04 DIAGNOSIS — I10 Essential (primary) hypertension: Secondary | ICD-10-CM | POA: Insufficient documentation

## 2019-09-04 DIAGNOSIS — Z79899 Other long term (current) drug therapy: Secondary | ICD-10-CM | POA: Diagnosis not present

## 2019-09-04 DIAGNOSIS — Z955 Presence of coronary angioplasty implant and graft: Secondary | ICD-10-CM | POA: Diagnosis not present

## 2019-09-04 DIAGNOSIS — F17210 Nicotine dependence, cigarettes, uncomplicated: Secondary | ICD-10-CM | POA: Diagnosis not present

## 2019-09-04 MED ORDER — METHOCARBAMOL 500 MG PO TABS: 500.0000 mg | ORAL_TABLET | Freq: Two times a day (BID) | ORAL | 0 refills | Status: DC

## 2019-09-04 MED ORDER — DICLOFENAC SODIUM 1 % TD GEL: 2.0000 g | Freq: Four times a day (QID) | TRANSDERMAL | 0 refills | Status: DC

## 2019-09-04 NOTE — ED Provider Notes (Signed)
[facility] EMERGENCY DEPARTMENT Provider Note   CSN: [id number] Arrival date & time: 09/04/19  1446     History   Chief Complaint Chief Complaint  Patient presents with  . Back Pain    HPI Kaitlin Torres is a 38 y.o. female with history of hypertension, hyperlipidemia, recent NSTEMI who presents with right-sided low back pain that has been going on for the past 2 weeks.  She reports it started across her low back, but has now been more present on the right side.  It radiates a little bit into her buttock, but not into her legs. It is worse with movement. She denies any saddle anesthesia, bowel or bladder incontinence, urinary symptoms, history of cancer, procedure to back, IVDU.  Patient has tried ibuprofen and Flexeril without much relief.  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  She denies any specific injury, however.     HPI  Past Medical History:  Diagnosis Date  . Anxiety   . 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 Cough   . Cyst of brain   . Generalized headaches   . Heart attack ([place]) 04/2019  . Hyperlipidemia   . Hypertension   . Nasal congestion   . PCOS (polycystic ovarian syndrome)   . Rectal pain     Patient Active Problem List   Diagnosis Date Noted  . Essential (primary) hypertension 06/10/2019  . Mixed hyperlipidemia 06/10/2019  . GAD (generalized anxiety disorder) 06/10/2019  . Vitamin D deficiency 06/10/2019  . PCOS (polycystic ovarian syndrome) 06/10/2019  . 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 NSTEMI (non-ST elevated myocardial infarction) ([place]) 05/08/2019  . Tobacco abuse 04/05/2018  . Obesity (BMI 30.0-34.9) 04/05/2018  . Abnormal CT scan, head 01/16/2018  . Irregular periods 11/14/2017    Past Surgical History:  Procedure Laterality Date  . ANKLE SURGERY  july 2005   screws placed in right ankle   . CORONARY STENT INTERVENTION N/A 05/08/2019   Procedure: CORONARY STENT INTERVENTION;  Surgeon: Martinique, Peter M, MD;  Location: [facility];  Service:  Cardiovascular;  Laterality: N/A;  . FRACTURE SURGERY  2005   right ankle  . HEMORRHOID SURGERY    . HEMORRHOID SURGERY  2015  . INCISION AND DRAINAGE BREAST ABSCESS  november 2011   left breast   . LEFT HEART CATH AND CORONARY ANGIOGRAPHY N/A 05/08/2019   Procedure: LEFT HEART CATH AND CORONARY ANGIOGRAPHY;  Surgeon: Martinique, Peter M, MD;  Location: [facility];  Service: Cardiovascular;  Laterality: N/A;     OB History    Gravida  3   Para  3   Term  3   Preterm      AB      Living  3     SAB      TAB      Ectopic      Multiple      Live Births  3            Home Medications    Prior to Admission medications   Medication Sig Start Date End Date Taking? Authorizing Provider  aspirin 81 MG EC tablet Take 1 tablet (81 mg total) by mouth daily. 05/16/19  Yes Aslam, Loralyn Freshwater, MD  clomiPHENE (CLOMID) 50 MG tablet Take 50 mg by mouth as directed. Take 2 tablet by mouth once daily for 5 days(day 3-7 on cycle) 07/16/19  Yes [provider]  labetalol (NORMODYNE) 100 MG tablet Take 1 tablet (100  mg total) by mouth 2 (two) times daily. 05/21/19  Yes Elouise Munroe, MD  nitroGLYCERIN (NITROSTAT) 0.4 MG SL tablet Place 1 tablet (0.4 mg total) under the tongue every 5 (five) minutes as needed for chest pain. 05/16/19  Yes Aslam, Loralyn Freshwater, MD  Prenatal Vit-Fe Fumarate-FA (PRENATAL MULTIVITAMIN) TABS tablet Take 1 tablet by mouth daily.    Yes [provider]  ticagrelor (BRILINTA) 90 MG TABS tablet Take 1 tablet (90 mg total) by mouth 2 (two) times daily. 05/16/19 09/13/19 Yes Aslam, Loralyn Freshwater, MD  tobramycin-dexamethasone [facility]) ophthalmic solution Apply 1 drop in affected eye(s) every 2 hours for two days. Then every 4 hours for 5 days. Patient taking differently: Place 1 drop into the left eye every 4 (four) hours.  08/14/19  Yes Claretta Fraise, MD  amoxicillin-clavulanate (AUGMENTIN) 875-125 MG tablet Take 1 tablet by mouth 2 (two) times daily. Take all of this  medication Patient not taking: Reported on 09/04/2019 08/14/19   Claretta Fraise, MD  diclofenac sodium (VOLTAREN) 1 % GEL Apply 2 g topically 4 (four) times daily. 09/04/19   Geanette Buonocore, Bea Graff, PA-C  fluconazole (DIFLUCAN) 150 MG tablet Take 150 mg by mouth as directed. For yeast infection 07/03/19   [provider]  methocarbamol (ROBAXIN) 500 MG tablet Take 1 tablet (500 mg total) by mouth 2 (two) times daily. 09/04/19   Frederica Kuster, PA-C    Family History Family History  Problem Relation Age of Onset  . Hypertension Mother   . Heart disease Father        Cardiac arrest 2006  . Diabetes Father   . Hyperlipidemia Father   . Stroke Father   . Diabetes Paternal Grandmother   . Cancer Maternal Grandmother        cervical  . Aneurysm Brother     Social History Social History   Tobacco Use  . Smoking status: Current Every Day Smoker    Packs/day: 0.10    Years: 26.00    Pack years: 2.60    Types: Cigarettes  . Smokeless tobacco: Never Used  . Tobacco comment: 4 cigs per day   Substance Use Topics  . Alcohol use: No    Frequency: Never    Comment: quit 2018  . Drug use: No     Allergies   Patient has no known allergies.   Review of Systems Review of Systems  Constitutional: Negative for chills and fever.  HENT: Negative for facial swelling and sore throat.   Respiratory: Negative for shortness of breath.   Cardiovascular: Negative for chest pain.  Gastrointestinal: Negative for abdominal pain, nausea and vomiting.  Genitourinary: Negative for dysuria.  Musculoskeletal: Positive for back pain and myalgias.  Skin: Negative for rash and wound.  Neurological: Negative for headaches.  Psychiatric/Behavioral: The patient is not nervous/anxious.      Physical Exam Updated Vital Signs BP 123/76 (BP Location: Right Arm)   Pulse 86   Temp 98.2 F (36.8 C) (Oral)   Resp 18   Ht 5\' 9"  (1.753 m)   Wt 104.3 kg   LMP 08/20/2019 (Exact Date)   SpO2 99%    BMI 33.97 kg/m   Physical Exam Vitals signs and nursing note reviewed.  Constitutional:      General: She is not in acute distress.    Appearance: She is well-developed. She is not diaphoretic.  HENT:     Head: Normocephalic and atraumatic.     Mouth/Throat:     Pharynx: No oropharyngeal  exudate.  Eyes:     General: No scleral icterus.       Right eye: No discharge.        Left eye: No discharge.     Conjunctiva/sclera: Conjunctivae normal.     Pupils: Pupils are equal, round, and reactive to light.  Neck:     Musculoskeletal: Normal range of motion and neck supple.     Thyroid: No thyromegaly.  Cardiovascular:     Rate and Rhythm: Normal rate and regular rhythm.     Heart sounds: Normal heart sounds. No murmur. No friction rub. No gallop.   Pulmonary:     Effort: Pulmonary effort is normal. No respiratory distress.     Breath sounds: Normal breath sounds. No stridor. No wheezing or rales.  Abdominal:     General: Bowel sounds are normal. There is no distension.     Palpations: Abdomen is soft.     Tenderness: There is no abdominal tenderness. There is no guarding or rebound.  Musculoskeletal:     Lumbar back: She exhibits spasm. She exhibits no bony tenderness.       Back:  Lymphadenopathy:     Cervical: No cervical adenopathy.  Skin:    General: Skin is warm and dry.     Coloration: Skin is not pale.     Findings: No rash.  Neurological:     Mental Status: She is alert.     Coordination: Coordination normal.     Comments: Normal sensation throughout; 5/5 strength in all 4 extremities; equal bilateral grip strength      ED Treatments / Results  Labs (all labs ordered are listed, but only abnormal results are displayed) Labs Reviewed - No data to display  EKG None  Radiology No results found.  Procedures Procedures (including critical care time)  Medications Ordered in ED Medications - No data to display   Initial Impression / Assessment and Plan /  ED Course  I have reviewed the triage vital signs and the nursing notes.  Pertinent labs & imaging results that were available during my care of the patient were reviewed by me and considered in my medical decision making (see chart for details).        Patient with back pain.  No neurological deficits and normal neuro exam.  Patient is ambulatory.  No loss of bowel or bladder control.  No concern for cauda equina.  No fever, night sweats, weight loss, h/o cancer, IVDA, no recent procedure to back. No urinary symptoms suggestive of UTI.  Supportive care and return precaution discussed.  Heat, ice, stretches discussed.  Will discharge home with Voltaren and Robaxin.  Patient is on Brilinta, so cautioned excessive oral NSAID use.  Follow-up to PCP and physical therapist.  Return precautions discussed.  Patient understands and agrees with plan.  Patient vitals stable throughout ED course and discharged in satisfactory condition.  Final Clinical Impressions(s) / ED Diagnoses   Final diagnoses:  Acute right-sided low back pain without sciatica    ED Discharge Orders         Ordered    diclofenac sodium (VOLTAREN) 1 % GEL  4 times daily     09/04/19 1649    methocarbamol (ROBAXIN) 500 MG tablet  2 times daily     09/04/19 1649           LawBea Graff, PA-C 09/04/19 1658    Davonna Belling, MD 09/04/19 [phone]

## 2019-09-04 NOTE — Discharge Instructions (Signed)
Apply Voltaren gel 4 times daily to the affected area.  Take Robaxin twice daily as needed for muscle pain or spasms.  Do not drive or operate machinery with this medication.  Use heat and ice alternating 20 minutes on, 20 minutes off.  Use heat and then attempt the exercises below and as demonstrated.  Please follow-up with your doctor and physical therapist for further evaluation and treatment of your back pain.  Please return to the emergency department if you develop complete numbness of your groin or legs, loss of bowel or bladder control, or any other concerning symptoms.

## 2019-09-04 NOTE — ED Triage Notes (Signed)
Patient reports low back pain that started 2 weeks ago. Patient states it is now concentrated on her R side with radiation to her buttock but no further. No known injury.

## 2019-09-06 ENCOUNTER — Encounter (HOSPITAL_COMMUNITY): Payer: Medicaid Other

## 2019-09-09 ENCOUNTER — Encounter (HOSPITAL_COMMUNITY): Payer: Medicaid Other

## 2019-09-11 ENCOUNTER — Encounter (HOSPITAL_COMMUNITY): Payer: Medicaid Other

## 2019-09-11 NOTE — Progress Notes (Signed)
Discharge Progress Report  Patient Details  Name: Kaitlin Torres MRN: [id number] Date of Birth: 02/20/1981 Referring Provider:     [place] from 08/05/2019 in [place]  Referring Provider  Margaretann Loveless       Number of Visits: 2  Reason for Discharge:  Early Exit:  Lack of attendance  Smoking History:  Social History   Tobacco Use  Smoking Status Current Every Day Smoker  . Packs/day: 0.10  . Years: 26.00  . Pack years: 2.60  . Types: Cigarettes  Smokeless Tobacco Never Used  Tobacco Comment   4 cigs per day     Diagnosis:  NSTEMI (non-ST elevated myocardial infarction) ([place])  Status post coronary artery stent placement  ADL UCSD:   Initial Exercise Prescription: Initial Exercise Prescription - 08/05/19 1300      Date of Initial Exercise RX and Referring Provider   Date  08/05/19    Referring Provider  Margaretann Loveless    Expected Discharge Date  11/04/19      Treadmill   MPH  2    Grade  0    Minutes  17    METs  2.53      NuStep   Level  1    SPM  70    Minutes  22    METs  2      Prescription Details   Frequency (times per week)  3    Duration  Progress to 30 minutes of continuous aerobic without signs/symptoms of physical distress      Intensity   THRR 40-80% of Max Heartrate  [id number]    Ratings of Perceived Exertion  11-15    Perceived Dyspnea  0-4      Progression   Progression  Continue to progress workloads to maintain intensity without signs/symptoms of physical distress.      Resistance Training   Training Prescription  Yes    Weight  1    Reps  10-15       Discharge Exercise Prescription (Final Exercise Prescription Changes): Exercise Prescription Changes - 08/15/19 1000      Response to Exercise   Blood Pressure (Admit)  122/82    Blood Pressure (Exercise)  152/78    Blood Pressure (Exit)  118/72    Heart Rate (Admit)  85 bpm    Heart Rate (Exercise)  101 bpm    Heart Rate (Exit)   97 bpm    Rating of Perceived Exertion (Exercise)  10    Symptoms  None    Comments  first two weeks of exercise     Duration  Continue with 30 min of aerobic exercise without signs/symptoms of physical distress.    Intensity  THRR unchanged      Progression   Progression  Continue to progress workloads to maintain intensity without signs/symptoms of physical distress.    Average METs  3.05      Resistance Training   Training Prescription  Yes    Weight  1   this is only the second session    Reps  10-15      Treadmill   MPH  1.7    Grade  0    Minutes  22    METs  2.3      Recumbant Elliptical   Level  1    RPM  56    Watts  64    Minutes  17    METs  3.8  Home Exercise Plan   Plans to continue exercise at  Home (comment)    Frequency  Add 2 additional days to program exercise sessions.       Functional Capacity: 6 Minute Walk    Row Name 08/05/19 1327         6 Minute Walk   Phase  Initial     Distance  1250 feet     Walk Time  6 minutes     # of Rest Breaks  0     MPH  2.36     METS  2.81     RPE  9     Perceived Dyspnea   6     VO2 Peak  15.44     Symptoms  No     Resting HR  78 bpm     Resting BP  130/80     Resting Oxygen Saturation   99 %     Exercise Oxygen Saturation  during 6 min walk  97 %     Max Ex. HR  100 bpm     Max Ex. BP  144/78     2 Minute Post BP  128/82        Psychological, QOL, Others - Outcomes: PHQ 2/9: Depression screen [facility] 2/9 08/05/2019 06/10/2019 05/16/2019 05/16/2018 04/05/2018  Decreased Interest 0 0 0 0 0  Down, Depressed, Hopeless 0 0 0 1 0  PHQ - 2 Score 0 0 0 1 0  Altered sleeping 0 - 0 - -  Tired, decreased energy 1 - 1 - -  Change in appetite 0 - 0 - -  Feeling bad or failure about yourself  0 - 0 - -  Trouble concentrating 0 - 0 - -  Moving slowly or fidgety/restless 0 - 0 - -  Suicidal thoughts 0 - 0 - -  PHQ-9 Score 1 - 1 - -  Difficult doing work/chores Somewhat difficult - Not difficult at all - -     Quality of Life: Quality of Life - 08/05/19 1332      Quality of Life   Select  Quality of Life      Quality of Life Scores   Health/Function Pre  21.6 %    Socioeconomic Pre  19.93 %    Psych/Spiritual Pre  21.07 %    Family Pre  24 %    GLOBAL Pre  21.5 %       Personal Goals: Goals established at orientation with interventions provided to work toward goal. Personal Goals and Risk Factors at Admission - 08/05/19 1437      Core Components/Risk Factors/Patient Goals on Admission    Weight Management  Weight Maintenance    Personal Goal Other  Yes    Personal Goal  Gain energy, gain strength. Cut back on smoking, want to exercise more.    Intervention  Attend CR 3 x week and supplement 2 x week with home exercise    Expected Outcomes  Reach goals        Personal Goals Discharge:   Exercise Goals and Review: Exercise Goals    Row Name 08/05/19 1330             Exercise Goals   Increase Physical Activity  Yes       Intervention  Provide advice, education, support and counseling about physical activity/exercise needs.;Develop an individualized exercise prescription for aerobic and resistive training based on initial evaluation findings, risk stratification, comorbidities and participant's personal  goals.       Expected Outcomes  Short Term: Attend rehab on a regular basis to increase amount of physical activity.;Long Term: Add in home exercise to make exercise part of routine and to increase amount of physical activity.;Long Term: Exercising regularly at least 3-5 days a week.       Increase Strength and Stamina  Yes       Intervention  Provide advice, education, support and counseling about physical activity/exercise needs.;Develop an individualized exercise prescription for aerobic and resistive training based on initial evaluation findings, risk stratification, comorbidities and participant's personal goals.       Expected Outcomes  Short Term: Increase workloads  from initial exercise prescription for resistance, speed, and METs.;Short Term: Perform resistance training exercises routinely during rehab and add in resistance training at home;Long Term: Improve cardiorespiratory fitness, muscular endurance and strength as measured by increased METs and functional capacity (6MWT)       Able to understand and use rate of perceived exertion (RPE) scale  Yes       Intervention  Provide education and explanation on how to use RPE scale       Expected Outcomes  Short Term: Able to use RPE daily in rehab to express subjective intensity level;Long Term:  Able to use RPE to guide intensity level when exercising independently       Able to understand and use Dyspnea scale  Yes       Intervention  Provide education and explanation on how to use Dyspnea scale       Expected Outcomes  Short Term: Able to use Dyspnea scale daily in rehab to express subjective sense of shortness of breath during exertion;Long Term: Able to use Dyspnea scale to guide intensity level when exercising independently       Knowledge and understanding of Target Heart Rate Range (THRR)  Yes       Intervention  Provide education and explanation of THRR including how the numbers were predicted and where they are located for reference       Expected Outcomes  Short Term: Able to state/look up THRR;Long Term: Able to use THRR to govern intensity when exercising independently;Short Term: Able to use daily as guideline for intensity in rehab       Able to check pulse independently  Yes       Intervention  Provide education and demonstration on how to check pulse in carotid and radial arteries.;Review the importance of being able to check your own pulse for safety during independent exercise       Expected Outcomes  Short Term: Able to explain why pulse checking is important during independent exercise;Long Term: Able to check pulse independently and accurately       Understanding of Exercise Prescription  Yes        Intervention  Provide education, explanation, and written materials on patient's individual exercise prescription       Expected Outcomes  Short Term: Able to explain program exercise prescription;Long Term: Able to explain home exercise prescription to exercise independently          Exercise Goals Re-Evaluation: Exercise Goals Re-Evaluation    [place] Name 08/20/19 1311             Exercise Goal Re-Evaluation   Exercise Goals Review  Increase Physical Activity;Increase Strength and Stamina;Able to understand and use rate of perceived exertion (RPE) scale;Able to understand and use Dyspnea scale;Knowledge and understanding of Target Heart Rate Range (THRR);Able to  check pulse independently;Understanding of Exercise Prescription       Comments  Pt. is new to the program. Her orientation was on 9/28 and she has only attended one exercise session since then. She tolerated the exercise well that day, but upon return will reassess to adjust her prescription as needed.       Expected Outcomes  Short: increase stength and energy levels Long: develop exercise routine to remain active at home, cut back on smoking.          Nutrition & Weight - Outcomes: Pre Biometrics - 08/05/19 1331      Pre Biometrics   Height  5\' 9"  (1.753 m)    Waist Circumference  39 inches    Hip Circumference  42 inches    Waist to Hip Ratio  0.93 %    BMI (Calculated)  32.9    Triceps Skinfold  17 mm    % Body Fat  37.3 %    Grip Strength  2.8 kg    Single Leg Stand  32.06 seconds        Nutrition: Nutrition Therapy & Goals - 08/05/19 1434      Nutrition Therapy   RD appointment deferred  Yes      Personal Nutrition Goals   Personal Goal #2  Patient is working on eating heart healthy. Handout given on how to make heart health choices.    Additional Goals?  No       Nutrition Discharge: Nutrition Assessments - 08/05/19 1436      MEDFICTS Scores   Pre Score  39       Education Questionnaire  Score: Knowledge Questionnaire Score - 08/05/19 1437      Knowledge Questionnaire Score   Pre Score  26/28      Patient stopped coming to Cardiac Rehab on 08/12/19. She completed 2 sessions.  Doctor will be informed.

## 2019-09-11 NOTE — Addendum Note (Signed)
Encounter addended by: Dwana Melena, RN on: 09/11/2019 7:47 AM  Actions taken: Flowsheet accepted, Clinical Note Signed, Episode resolved

## 2019-09-13 ENCOUNTER — Encounter (HOSPITAL_COMMUNITY): Payer: Medicaid Other

## 2019-09-16 ENCOUNTER — Encounter (HOSPITAL_COMMUNITY): Payer: Medicaid Other

## 2019-09-18 ENCOUNTER — Encounter (HOSPITAL_COMMUNITY): Payer: Medicaid Other

## 2019-09-20 ENCOUNTER — Encounter (HOSPITAL_COMMUNITY): Payer: Medicaid Other

## 2019-09-23 ENCOUNTER — Encounter (HOSPITAL_COMMUNITY): Payer: Medicaid Other

## 2019-09-25 ENCOUNTER — Encounter (HOSPITAL_COMMUNITY): Payer: Medicaid Other

## 2019-09-27 ENCOUNTER — Encounter (HOSPITAL_COMMUNITY): Payer: Medicaid Other

## 2019-09-30 ENCOUNTER — Encounter (HOSPITAL_COMMUNITY): Payer: Medicaid Other

## 2019-10-02 ENCOUNTER — Encounter (HOSPITAL_COMMUNITY): Payer: Medicaid Other

## 2019-10-04 ENCOUNTER — Encounter (HOSPITAL_COMMUNITY): Payer: Medicaid Other

## 2019-10-07 ENCOUNTER — Encounter (HOSPITAL_COMMUNITY): Payer: Medicaid Other

## 2019-10-09 ENCOUNTER — Encounter (HOSPITAL_COMMUNITY): Payer: Medicaid Other

## 2019-10-10 ENCOUNTER — Other Ambulatory Visit: Payer: Self-pay | Admitting: Internal Medicine

## 2019-10-11 ENCOUNTER — Encounter (HOSPITAL_COMMUNITY): Payer: Medicaid Other

## 2019-10-14 ENCOUNTER — Other Ambulatory Visit: Payer: Self-pay | Admitting: Internal Medicine

## 2019-10-14 ENCOUNTER — Encounter (HOSPITAL_COMMUNITY): Payer: Medicaid Other

## 2019-10-14 ENCOUNTER — Telehealth: Payer: Self-pay | Admitting: Internal Medicine

## 2019-10-14 NOTE — Telephone Encounter (Signed)
. °*  STAT* If patient is at the pharmacy, call can be transferred to refill team.   1. Which medications need to be refilled? (please list name of each medication and dose if known) (BRILINTA) 90 MG TABS tablet  2. Which pharmacy/location (including street and city if local pharmacy) is medication to be sent to? [place], [address]  3. Do they need a 30 day or 90 day supply? 90 day   Patient has been out of medication for the past 4-5 days.

## 2019-10-14 NOTE — Telephone Encounter (Signed)
Yes she had an NSTEMI please refill asap. Thanks! GA

## 2019-10-15 ENCOUNTER — Other Ambulatory Visit: Payer: Self-pay | Admitting: *Deleted

## 2019-10-15 MED ORDER — TICAGRELOR 90 MG PO TABS: 90.0000 mg | ORAL_TABLET | Freq: Two times a day (BID) | ORAL | 9 refills | Status: DC

## 2019-10-15 MED ORDER — LABETALOL HCL 100 MG PO TABS: 100.0000 mg | ORAL_TABLET | Freq: Two times a day (BID) | ORAL | 5 refills | Status: DC

## 2019-10-15 NOTE — Telephone Encounter (Signed)
REFILL SENT AS REQUESTED ?

## 2019-10-16 ENCOUNTER — Encounter (HOSPITAL_COMMUNITY): Payer: Medicaid Other

## 2019-10-18 ENCOUNTER — Encounter (HOSPITAL_COMMUNITY): Payer: Medicaid Other

## 2019-10-18 DIAGNOSIS — Z113 Encounter for screening for infections with a predominantly sexual mode of transmission: Secondary | ICD-10-CM | POA: Diagnosis not present

## 2019-10-18 DIAGNOSIS — N898 Other specified noninflammatory disorders of vagina: Secondary | ICD-10-CM | POA: Diagnosis not present

## 2019-10-21 ENCOUNTER — Encounter (HOSPITAL_COMMUNITY): Payer: Medicaid Other

## 2019-10-23 ENCOUNTER — Encounter (HOSPITAL_COMMUNITY): Payer: Medicaid Other

## 2019-10-25 ENCOUNTER — Encounter (HOSPITAL_COMMUNITY): Payer: Medicaid Other

## 2019-10-28 ENCOUNTER — Encounter (HOSPITAL_COMMUNITY): Payer: Medicaid Other

## 2019-10-30 ENCOUNTER — Encounter (HOSPITAL_COMMUNITY): Payer: Medicaid Other

## 2019-11-07 NOTE — Telephone Encounter (Signed)
I called the patient today to discuss her MyChart message regarding hand numbness and pain.  She tells me that this will occur after she wakes from sleep and primarily notices it while she is driving.  She denies any radiating left arm pain and denies neck or jaw pain.  Of note her anginal symptom on presentation with NSTEMI was "an elephant sitting on her chest".  She tells me she does not have symptoms similar to her NSTEMI.  She does not use a computer keyboard for many hours a day and has never been told that she has carpal tunnel syndrome.  She says her hand numbness does not extend beyond the wrist.  She was concerned this may be related to her heart.  I have described to her the red flag symptoms to present to the ER, and have told her that she is welcome to call for an appointment in my clinic next week and I would be happy to see her.  Her symptoms sound most consistent with carpal tunnel syndrome.  We discussed that she can try a wrist brace over the holiday weekend for symptom management and I have encouraged her to call her primary care doctor today to discuss further.  I have instructed her to go to the emergency department if she has any chest discomfort, left arm radiating pain, or any significant back, shoulder, or neck pain.  She understands and will do so.

## 2019-11-18 ENCOUNTER — Telehealth: Payer: Self-pay | Admitting: Family Medicine

## 2019-11-18 NOTE — Telephone Encounter (Signed)
Patient has a follow up appointment scheduled. 

## 2019-11-21 ENCOUNTER — Encounter: Payer: Self-pay | Admitting: Family Medicine

## 2019-11-21 ENCOUNTER — Ambulatory Visit (INDEPENDENT_AMBULATORY_CARE_PROVIDER_SITE_OTHER): Payer: Medicaid Other | Admitting: Family Medicine

## 2019-11-21 DIAGNOSIS — R202 Paresthesia of skin: Secondary | ICD-10-CM

## 2019-11-21 DIAGNOSIS — G5603 Carpal tunnel syndrome, bilateral upper limbs: Secondary | ICD-10-CM

## 2019-11-21 MED ORDER — PREDNISONE 10 MG (21) PO TBPK: ORAL_TABLET | ORAL | 0 refills | Status: DC

## 2019-11-21 NOTE — Progress Notes (Signed)
Virtual Visit via telephone Note Due to COVID-19 pandemic this visit was conducted virtually. This visit type was conducted due to national recommendations for restrictions regarding the COVID-19 Pandemic (e.g. social distancing, sheltering in place) in an effort to limit this patient's exposure and mitigate transmission in our community. All issues noted in this document were discussed and addressed.  A physical exam was not performed with this format.   I connected with Kaitlin Torres on 11/21/2019 at 1400 by telephone and verified that I am speaking with the correct person using two identifiers. Kaitlin Torres is currently located at home and family is currently with them during visit. The provider, Monia Pouch, FNP is located in their office at time of visit.  I discussed the limitations, risks, security and privacy concerns of performing an evaluation and management service by telephone and the availability of in person appointments. I also discussed with the patient that there may be a patient responsible charge related to this service. The patient expressed understanding and agreed to proceed.  Subjective:  Patient ID: Kaitlin Torres, female    DOB: Dec 24, 1980, 39 y.o.   MRN: [id number]  Chief Complaint:  Hand Pain   HPI: Kaitlin Torres is a 39 y.o. female presenting on 11/21/2019 for Hand Pain   Pt reports bilateral hand pain with numbness and tingling to her first three digits. Pt states certain movements make the pain worse. Pt denies loss of grip strength. 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 She has not tried anything for the pain or tingling. States she did have carpal tunnel when she was pregnant.     Relevant past medical, surgical, family, and social history reviewed and updated as indicated.  Allergies and medications reviewed and updated.   Past Medical History:  Diagnosis Date  . Anxiety   . 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 Cough   . Cyst of  brain   . Generalized headaches   . Heart attack ([place]) 04/2019  . Hyperlipidemia   . Hypertension   . Nasal congestion   . PCOS (polycystic ovarian syndrome)   . Rectal pain     Past Surgical History:  Procedure Laterality Date  . ANKLE SURGERY  july 2005   screws placed in right ankle   . CORONARY STENT INTERVENTION N/A 05/08/2019   Procedure: CORONARY STENT INTERVENTION;  Surgeon: Martinique, Peter M, MD;  Location: [facility];  Service: Cardiovascular;  Laterality: N/A;  . FRACTURE SURGERY  2005   right ankle  . HEMORRHOID SURGERY    . HEMORRHOID SURGERY  2015  . INCISION AND DRAINAGE BREAST ABSCESS  november 2011   left breast   . LEFT HEART CATH AND CORONARY ANGIOGRAPHY N/A 05/08/2019   Procedure: LEFT HEART CATH AND CORONARY ANGIOGRAPHY;  Surgeon: Martinique, Peter M, MD;  Location: [facility];  Service: Cardiovascular;  Laterality: N/A;    Social History   Socioeconomic History  . Marital status: Married    Spouse name: Not on file  . Number of children: 3  . Years of education: 40  . Highest education level: Associate degree: academic program  Occupational History  . Occupation: Waitress  Tobacco Use  . Smoking status: Current Every Day Smoker    Packs/day: 0.10    Years: 26.00    Pack years: 2.60    Types: Cigarettes  . Smokeless tobacco: Never Used  . Tobacco comment: 4 cigs per day   Substance  and Sexual Activity  . Alcohol use: No    Comment: quit 2018  . Drug use: No  . Sexual activity: Yes    Birth control/protection: None    Comment: married, 3 kids, vaginal, trying for another  Other Topics Concern  . Not on file  Social History Narrative   Lives at home with her mother, husband and three daughters.   Right-handed.   Approximately 20 cups caffeine per day (4-5 glasses holding 32oz of tea).   Social Determinants of Health   Financial Resource Strain:   . Difficulty of Paying Living Expenses: Not on file  Food Insecurity:   . Worried  About Charity fundraiser in the Last Year: Not on file  . Ran Out of Food in the Last Year: Not on file  Transportation Needs:   . Lack of Transportation (Medical): Not on file  . Lack of Transportation (Non-Medical): Not on file  Physical Activity:   . Days of Exercise per Week: Not on file  . Minutes of Exercise per Session: Not on file  Stress:   . Feeling of Stress : Not on file  Social Connections:   . Frequency of Communication with Friends and Family: Not on file  . Frequency of Social Gatherings with Friends and Family: Not on file  . Attends Religious Services: Not on file  . Active Member of Clubs or Organizations: Not on file  . Attends Archivist Meetings: Not on file  . Marital Status: Not on file  Intimate Partner Violence:   . Fear of Current or Ex-Partner: Not on file  . Emotionally Abused: Not on file  . Physically Abused: Not on file  . Sexually Abused: Not on file    Outpatient Encounter Medications as of 11/21/2019  Medication Sig  . amoxicillin-clavulanate (AUGMENTIN) 875-125 MG tablet Take 1 tablet by mouth 2 (two) times daily. Take all of this medication (Patient not taking: Reported on 09/04/2019)  . aspirin 81 MG EC tablet Take 1 tablet (81 mg total) by mouth daily.  . clomiPHENE (CLOMID) 50 MG tablet Take 50 mg by mouth as directed. Take 2 tablet by mouth once daily for 5 days(day 3-7 on cycle)  . diclofenac sodium (VOLTAREN) 1 % GEL Apply 2 g topically 4 (four) times daily.  . fluconazole (DIFLUCAN) 150 MG tablet Take 150 mg by mouth as directed. For yeast infection  . labetalol (NORMODYNE) 100 MG tablet Take 1 tablet (100 mg total) by mouth 2 (two) times daily.  . methocarbamol (ROBAXIN) 500 MG tablet Take 1 tablet (500 mg total) by mouth 2 (two) times daily.  . nitroGLYCERIN (NITROSTAT) 0.4 MG SL tablet Place 1 tablet (0.4 mg total) under the tongue every 5 (five) minutes as needed for chest pain.  . predniSONE (STERAPRED UNI-PAK 21 TAB) 10 MG  (21) TBPK tablet As directed x 6 days  . Prenatal Vit-Fe Fumarate-FA (PRENATAL MULTIVITAMIN) TABS tablet Take 1 tablet by mouth daily.   . ticagrelor (BRILINTA) 90 MG TABS tablet Take 1 tablet (90 mg total) by mouth 2 (two) times daily.  Marland Kitchen tobramycin-dexamethasone (TOBRADEX) ophthalmic solution Apply 1 drop in affected eye(s) every 2 hours for two days. Then every 4 hours for 5 days. (Patient taking differently: Place 1 drop into the left eye every 4 (four) hours. )   No facility-administered encounter medications on file as of 11/21/2019.    No Known Allergies  Review of Systems  Constitutional: Negative for activity change, appetite change, chills,  diaphoresis, fatigue, fever and unexpected weight change.  HENT: Negative.   Eyes: Negative.   Respiratory: Negative for cough, chest tightness and shortness of breath.   Cardiovascular: Negative for chest pain, palpitations and leg swelling.  Gastrointestinal: Negative for blood in stool, constipation, diarrhea, nausea and vomiting.  Endocrine: Negative.   Genitourinary: Negative for decreased urine volume, difficulty urinating, dysuria, frequency and urgency.  Musculoskeletal: Positive for arthralgias and myalgias. Negative for back pain, gait problem, joint swelling, neck pain and neck stiffness.  Skin: Negative.   Allergic/Immunologic: Negative.   Neurological: Positive for numbness. Negative for dizziness, weakness and headaches.  Hematological: Negative.   Psychiatric/Behavioral: Negative for confusion, hallucinations, sleep disturbance and suicidal ideas.  All other systems reviewed and are negative.        Observations/Objective: No vital signs or physical exam, this was a telephone or virtual health encounter.  Pt alert and oriented, answers all questions appropriately, and able to speak in full sentences.  Pt asked to perform the Phalen Maneuver while on the phone. Pt reports this does make the tingling worse.   Assessment  and Plan: Kaitlin Torres was seen today for hand pain.  Diagnoses and all orders for this visit:  Paresthesia of both hands Bilateral carpal tunnel syndrome Reported symptoms consistent with carpal tunnel syndrome. Will treat with burst of steroids. Pt aware to wear bilateral wrists splints, especially at night. Follow up in 4 weeks for reevaluation.  -     predniSONE (STERAPRED UNI-PAK 21 TAB) 10 MG (21) TBPK tablet; As directed x 6 days     Follow Up Instructions: Return in about 4 weeks (around 12/19/2019), or if symptoms worsen or fail to improve, for carpal tunnel syndrome.    I discussed the assessment and treatment plan with the patient. The patient was provided an opportunity to ask questions and all were answered. The patient agreed with the plan and demonstrated an understanding of the instructions.   The patient was advised to call back or seek an in-person evaluation if the symptoms worsen or if the condition fails to improve as anticipated.  The above assessment and management plan was discussed with the patient. The patient verbalized understanding of and has agreed to the management plan. Patient is aware to call the clinic if they develop any new symptoms or if symptoms persist or worsen. Patient is aware when to return to the clinic for a follow-up visit. Patient educated on when it is appropriate to go to the emergency department.    I provided 15 minutes of non-face-to-face time during this encounter. The call started at 1400. The call ended at 1415. The other time was used for coordination of care.    Monia Pouch, FNP-C [facility] [address] [place], [place] [address] [phone] 11/21/2019

## 2019-12-09 ENCOUNTER — Ambulatory Visit: Payer: Medicaid Other | Admitting: Internal Medicine

## 2019-12-17 ENCOUNTER — Ambulatory Visit: Payer: Medicaid Other | Admitting: Internal Medicine

## 2019-12-19 ENCOUNTER — Ambulatory Visit: Payer: Medicaid Other | Attending: Internal Medicine

## 2019-12-19 ENCOUNTER — Other Ambulatory Visit: Payer: Self-pay

## 2019-12-19 DIAGNOSIS — Z20822 Contact with and (suspected) exposure to covid-19: Secondary | ICD-10-CM | POA: Diagnosis not present

## 2019-12-20 ENCOUNTER — Telehealth: Payer: Self-pay

## 2019-12-20 LAB — NOVEL CORONAVIRUS, NAA: SARS-CoV-2, NAA: DETECTED — AB

## 2019-12-20 NOTE — Telephone Encounter (Signed)
Pt call to get covid test results. Will have nurse call her back  [facility]

## 2019-12-20 NOTE — Telephone Encounter (Signed)
Pt. Called to verify positive COVID 19 result. Reviewed home safety and quarantine information sent to pt.'s My Chart per Cristal Generous. Verbalizes understanding.

## 2019-12-20 NOTE — Telephone Encounter (Signed)
Patient called and left message to return call for results. MyChart message also sent to patient.

## 2019-12-31 ENCOUNTER — Telehealth: Payer: Self-pay | Admitting: *Deleted

## 2019-12-31 ENCOUNTER — Encounter: Payer: Self-pay | Admitting: Internal Medicine

## 2019-12-31 ENCOUNTER — Telehealth (INDEPENDENT_AMBULATORY_CARE_PROVIDER_SITE_OTHER): Payer: Medicaid Other | Admitting: Internal Medicine

## 2019-12-31 VITALS — BP 112/73 | HR 97 | Ht 69.0 in

## 2019-12-31 DIAGNOSIS — I10 Essential (primary) hypertension: Secondary | ICD-10-CM | POA: Diagnosis not present

## 2019-12-31 DIAGNOSIS — E785 Hyperlipidemia, unspecified: Secondary | ICD-10-CM | POA: Diagnosis not present

## 2019-12-31 DIAGNOSIS — I252 Old myocardial infarction: Secondary | ICD-10-CM | POA: Diagnosis not present

## 2019-12-31 DIAGNOSIS — I214 Non-ST elevation (NSTEMI) myocardial infarction: Secondary | ICD-10-CM

## 2019-12-31 MED ORDER — NITROGLYCERIN 0.4 MG SL SUBL: 0.4000 mg | SUBLINGUAL_TABLET | SUBLINGUAL | 4 refills | Status: DC | PRN

## 2019-12-31 NOTE — Progress Notes (Signed)
Virtual Visit via Telephone Note   This visit type was conducted due to national recommendations for restrictions regarding the COVID-19 Pandemic (e.g. social distancing) in an effort to limit this patient's exposure and mitigate transmission in our community.  Due to her co-morbid illnesses, this patient is at least at moderate risk for complications without adequate follow up.  This format is felt to be most appropriate for this patient at this time.  The patient did not have access to video technology/had technical difficulties with video requiring transitioning to audio format only (telephone).  All issues noted in this document were discussed and addressed.  No physical exam could be performed with this format.  Please refer to the patient's chart for her  consent to telehealth for [facility].   Date:  12/31/2019   ID:  Kaitlin Torres, DOB 02/23/1981, MRN [id number]  Patient Location: Home Provider Location: Home  PCP:  Baruch Gouty, FNP  Cardiologist:  Elouise Munroe, MD  Electrophysiologist:  None   Evaluation Performed:  Follow-Up Visit  Chief Complaint:  F/u NSTEMI s/p PCI to RCA and circ  History of Present Illness:    Kaitlin Torres is a 39 y.o. female with HTN, HLD, and early family history of MI and SCD who presents today for post hospital follow up after NSTEMI with stent x 2 to RCA and circ system.   Had covid 19 2-3 weeks ago. Did not require hospitalization.   Continues to plan a pregnancy. Not been taking the clomid while awaiting sperm counts for her husband. Losartan and atorvastatin on hold while she plans pregnancy.  No recent CP, SOB. Did have chest pain months ago on two occasions, took 1 nitro which relieved pain. She thought it was due to a panic attack.   The patient does not have symptoms concerning for COVID-19 infection (fever, chills, cough, or new shortness of breath).    Past Medical History:  Diagnosis Date  . Anxiety   . Brain cyst  06/10/2019  . Cough   . Cyst of brain   . Generalized headaches   . Heart attack ([place]) 04/2019  . Hyperlipidemia   . Hypertension   . Nasal congestion   . PCOS (polycystic ovarian syndrome)   . Rectal pain    Past Surgical History:  Procedure Laterality Date  . ANKLE SURGERY  july 2005   screws placed in right ankle   . CORONARY STENT INTERVENTION N/A 05/08/2019   Procedure: CORONARY STENT INTERVENTION;  Surgeon: Martinique, Peter M, MD;  Location: [facility];  Service: Cardiovascular;  Laterality: N/A;  . FRACTURE SURGERY  2005   right ankle  . HEMORRHOID SURGERY    . HEMORRHOID SURGERY  2015  . INCISION AND DRAINAGE BREAST ABSCESS  november 2011   left breast   . LEFT HEART CATH AND CORONARY ANGIOGRAPHY N/A 05/08/2019   Procedure: LEFT HEART CATH AND CORONARY ANGIOGRAPHY;  Surgeon: Martinique, Peter M, MD;  Location: [facility];  Service: Cardiovascular;  Laterality: N/A;     Current Meds  Medication Sig  . aspirin 81 MG EC tablet Take 1 tablet (81 mg total) by mouth daily.  . fluconazole (DIFLUCAN) 150 MG tablet Take 150 mg by mouth as directed. For yeast infection  . labetalol (NORMODYNE) 100 MG tablet Take 1 tablet (100 mg total) by mouth 2 (two) times daily.  . nitroGLYCERIN (NITROSTAT) 0.4 MG SL tablet Place 1 tablet (0.4 mg total) under the tongue  every 5 (five) minutes as needed for chest pain.  . ticagrelor (BRILINTA) 90 MG TABS tablet Take 1 tablet (90 mg total) by mouth 2 (two) times daily.  Marland Kitchen tobramycin-dexamethasone (TOBRADEX) ophthalmic solution Apply 1 drop in affected eye(s) every 2 hours for two days. Then every 4 hours for 5 days. (Patient taking differently: Place 1 drop into the left eye every 4 (four) hours. )     Allergies:   Patient has no known allergies.   Social History   Tobacco Use  . Smoking status: Current Every Day Smoker    Packs/day: 0.10    Years: 26.00    Pack years: 2.60    Types: Cigarettes  . Smokeless tobacco: Never Used  .  Tobacco comment: 4 cigs per day   Substance Use Topics  . Alcohol use: No    Comment: quit 2018  . Drug use: No     Family Hx: The patient's family history includes Aneurysm in her brother; Cancer in her maternal grandmother; Diabetes in her father and paternal grandmother; Heart disease in her father; Hyperlipidemia in her father; Hypertension in her mother; Stroke in her father.  ROS:   Please see the history of present illness.     All other systems reviewed and are negative.   Prior CV studies:   The following studies were reviewed today:    Labs/Other Tests and Data Reviewed:    EKG:  No ECG reviewed.  Recent Labs: 05/08/2019: Magnesium 1.9 06/10/2019: ALT 15; BUN 9; Creatinine, Ser 0.81; Hemoglobin 11.0; Platelets 332; Potassium 4.3; Sodium 140; TSH 1.260   Recent Lipid Panel Lab Results  Component Value Date/Time   CHOL 206 (H) 06/10/2019 11:35 AM   TRIG 389 (H) 06/10/2019 11:35 AM   HDL 22 (L) 06/10/2019 11:35 AM   CHOLHDL 9.4 (H) 06/10/2019 11:35 AM   CHOLHDL 10.0 05/09/2019 03:57 AM   LDLCALC 106 (H) 06/10/2019 11:35 AM    Wt Readings from Last 3 Encounters:  09/04/19 230 lb (104.3 kg)  08/05/19 222 lb 14.4 oz (101.1 kg)  07/04/19 231 lb (104.8 kg)     Objective:    Vital Signs:  BP 112/73   Pulse 97   Ht 5\' 9"  (1.753 m)   BMI 33.97 kg/m    VITAL SIGNS:  reviewed GEN:  no acute distress RESPIRATORY:  normal respiratory effort NEURO:  alert and oriented x 3, no obvious focal deficit PSYCH:  normal affect  ASSESSMENT & PLAN:    NSTEMI - she needs to continue DAPT for 1 year. I have instructed the patient that dual antiplatelet therapy should be taken for 1 year without interruption.  We have discussed the consequences of interrupted dual antiplatelet therapy and the risk for in-stent thrombosis. She tells me she has missed some doses, we discussed the critical nature of daily DAPT.   Fertility therapy/intending pregnancy - Continues to plan  pregnancy. Losartan and atorvastatin discontinued. We will likely discontinue ticagrelor at the 1 year mark (05/07/2020), so this should not impact delivery as she is not currently pregnant.   HTN - BP well controlled on labetalol 100 mg BID.  HLD - atorvastatin on hold while planning pregnancy. Encourage diet and lifestyle modification.    COVID-19 Education: The signs and symptoms of COVID-19 were discussed with the patient and how to seek care for testing (follow up with PCP or arrange E-visit).  The importance of social distancing was discussed today.  Time:   Today, I have spent 15  minutes with the patient with telehealth technology discussing the above problems.     Medication Adjustments/Labs and Tests Ordered: Current medicines are reviewed at length with the patient today.  Concerns regarding medicines are outlined above.   Tests Ordered: No orders of the defined types were placed in this encounter.   Medication Changes: No orders of the defined types were placed in this encounter.   Follow Up:   3 mo  Signed, Elouise Munroe, MD  12/31/2019 9:04 AM    [place]

## 2019-12-31 NOTE — Patient Instructions (Addendum)
Medication Instructions:  No changes  *If you need a refill on your cardiac medications before your next appointment, please call your pharmacy*  Lab Work: Not needed  Testing/Procedures: Not needed  Follow-Up: At [facility], you and your health needs are our priority.  As part of our continuing mission to provide you with exceptional heart care, we have created designated Provider Care Teams.  These Care Teams include your primary Cardiologist (physician) and Advanced Practice Providers (APPs -  Physician Assistants and Nurse Practitioners) who all work together to provide you with the care you need, when you need it.  Your next appointment:   3 month(s)  The format for your next appointment:   Either In Person or Virtual  Provider:   Cherlynn Kaiser, MD  Other Instructions n/a

## 2019-12-31 NOTE — Telephone Encounter (Signed)
RN spoke to patient. Instruction were given  from today's virtual visit  12/31/19 .  AVS SUMMARY has been sent by mychart . Prescription e-sent to pharmacy .    Patient verbalized understanding

## 2020-01-22 ENCOUNTER — Encounter: Payer: Self-pay | Admitting: Family Medicine

## 2020-01-22 ENCOUNTER — Telehealth (INDEPENDENT_AMBULATORY_CARE_PROVIDER_SITE_OTHER): Payer: Medicaid Other | Admitting: Family Medicine

## 2020-01-22 DIAGNOSIS — K1379 Other lesions of oral mucosa: Secondary | ICD-10-CM | POA: Diagnosis not present

## 2020-01-22 DIAGNOSIS — B370 Candidal stomatitis: Secondary | ICD-10-CM | POA: Diagnosis not present

## 2020-01-22 MED ORDER — NYSTATIN 100000 UNIT/ML MT SUSP: 5.0000 mL | Freq: Four times a day (QID) | OROMUCOSAL | 0 refills | Status: AC

## 2020-01-22 MED ORDER — LIDOCAINE VISCOUS HCL 2 % MT SOLN: 15.0000 mL | OROMUCOSAL | 0 refills | Status: AC | PRN

## 2020-01-22 NOTE — Progress Notes (Signed)
Virtual Visit via MyChart Video Note Due to COVID-19 pandemic this visit was conducted virtually. This visit type was conducted due to national recommendations for restrictions regarding the COVID-19 Pandemic (e.g. social distancing, sheltering in place) in an effort to limit this patient's exposure and mitigate transmission in our community. All issues noted in this document were discussed and addressed.  A physical exam was not performed with this format.   I connected with Kaitlin Torres on 01/22/2020 at 1520 by video and verified that I am speaking with the correct person using two identifiers. Kaitlin Torres is currently located at home and no one is currently with them during visit. The provider, Monia Pouch, FNP is located in their office at time of visit.  I discussed the limitations, risks, security and privacy concerns of performing an evaluation and management service by video / telephone and the availability of in person appointments. I also discussed with the patient that there may be a patient responsible charge related to this service. The patient expressed understanding and agreed to proceed.  Subjective:  Patient ID: Kaitlin Torres, female    DOB: 1981-10-17, 39 y.o.   MRN: [id number]  Chief Complaint:  Thrush   HPI: Kaitlin Torres is a 39 y.o. female presenting on 01/22/2020 for Thrush   Pt reports a white coating to her tongue with lesions to the side of her tongue that are painful. Pt states this started several days ago and is not getting better. States she has tried antiseptic oral rinses and mouthwash without resolve of symptoms. States it hurts to eat at times due to the lesions on the side of her tongue. States she can not brush the coating from her tongue. No oral bleeding or gum irritation.     Relevant past medical, surgical, family, and social history reviewed and updated as indicated.  Allergies and medications reviewed and updated.   Past Medical  History:  Diagnosis Date  . Anxiety   . 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 Cough   . Cyst of brain   . Generalized headaches   . Heart attack ([place]) 04/2019  . Hyperlipidemia   . Hypertension   . Nasal congestion   . PCOS (polycystic ovarian syndrome)   . Rectal pain     Past Surgical History:  Procedure Laterality Date  . ANKLE SURGERY  july 2005   screws placed in right ankle   . CORONARY STENT INTERVENTION N/A 05/08/2019   Procedure: CORONARY STENT INTERVENTION;  Surgeon: Martinique, Peter M, MD;  Location: [facility];  Service: Cardiovascular;  Laterality: N/A;  . FRACTURE SURGERY  2005   right ankle  . HEMORRHOID SURGERY    . HEMORRHOID SURGERY  2015  . INCISION AND DRAINAGE BREAST ABSCESS  november 2011   left breast   . LEFT HEART CATH AND CORONARY ANGIOGRAPHY N/A 05/08/2019   Procedure: LEFT HEART CATH AND CORONARY ANGIOGRAPHY;  Surgeon: Martinique, Peter M, MD;  Location: [facility];  Service: Cardiovascular;  Laterality: N/A;    Social History   Socioeconomic History  . Marital status: Married    Spouse name: Not on file  . Number of children: 3  . Years of education: 15  . Highest education level: Associate degree: academic program  Occupational History  . Occupation: Waitress  Tobacco Use  . Smoking status: Current Every Day Smoker    Packs/day: 0.10    Years: 26.00    Pack years: 2.60  Types: Cigarettes  . Smokeless tobacco: Never Used  . Tobacco comment: 4 cigs per day   Substance and Sexual Activity  . Alcohol use: No    Comment: quit 2018  . Drug use: No  . Sexual activity: Yes    Birth control/protection: None    Comment: married, 3 kids, vaginal, trying for another  Other Topics Concern  . Not on file  Social History Narrative   Lives at home with her mother, husband and three daughters.   Right-handed.   Approximately 20 cups caffeine per day (4-5 glasses holding 32oz of tea).   Social Determinants of Health   Financial Resource Strain:     . Difficulty of Paying Living Expenses:   Food Insecurity:   . Worried About Charity fundraiser in the Last Year:   . Arboriculturist in the Last Year:   Transportation Needs:   . Film/video editor (Medical):   Marland Kitchen Lack of Transportation (Non-Medical):   Physical Activity:   . Days of Exercise per Week:   . Minutes of Exercise per Session:   Stress:   . Feeling of Stress :   Social Connections:   . Frequency of Communication with Friends and Family:   . Frequency of Social Gatherings with Friends and Family:   . Attends Religious Services:   . Active Member of Clubs or Organizations:   . Attends Archivist Meetings:   Marland Kitchen Marital Status:   Intimate Partner Violence:   . Fear of Current or Ex-Partner:   . Emotionally Abused:   Marland Kitchen Physically Abused:   . Sexually Abused:     Outpatient Encounter Medications as of 01/22/2020  Medication Sig  . aspirin 81 MG EC tablet Take 1 tablet (81 mg total) by mouth daily.  . clomiPHENE (CLOMID) 50 MG tablet Take 50 mg by mouth as directed. Take 2 tablet by mouth once daily for 5 days(day 3-7 on cycle)  . fluconazole (DIFLUCAN) 150 MG tablet Take 150 mg by mouth as directed. For yeast infection  . labetalol (NORMODYNE) 100 MG tablet Take 1 tablet (100 mg total) by mouth 2 (two) times daily.  Marland Kitchen lidocaine (XYLOCAINE) 2 % solution Use as directed 15 mLs in the mouth or throat every 4 (four) hours as needed for up to 7 days for mouth pain. Add 1 ml to each dose of nystatin  . nitroGLYCERIN (NITROSTAT) 0.4 MG SL tablet Place 1 tablet (0.4 mg total) under the tongue every 5 (five) minutes as needed for chest pain. Up to 3 times with each episode  . nystatin (MYCOSTATIN) 100000 UNIT/ML suspension Take 5 mLs (500,000 Units total) by mouth 4 (four) times daily for 7 days. Add 1 ml of lidocaine to each dose  . ticagrelor (BRILINTA) 90 MG TABS tablet Take 1 tablet (90 mg total) by mouth 2 (two) times daily.  Marland Kitchen tobramycin-dexamethasone (TOBRADEX)  ophthalmic solution Apply 1 drop in affected eye(s) every 2 hours for two days. Then every 4 hours for 5 days. (Patient taking differently: Place 1 drop into the left eye every 4 (four) hours. )   No facility-administered encounter medications on file as of 01/22/2020.    No Known Allergies  Review of Systems  Constitutional: Negative for activity change, appetite change, chills, diaphoresis, fatigue, fever and unexpected weight change.  HENT: Positive for mouth sores. Negative for drooling, sore throat, trouble swallowing and voice change.   Eyes: Negative.  Negative for photophobia and visual disturbance.  Respiratory: Negative for cough, chest tightness and shortness of breath.   Cardiovascular: Negative for chest pain, palpitations and leg swelling.  Gastrointestinal: Negative for abdominal pain, blood in stool, constipation, diarrhea, nausea and vomiting.  Endocrine: Negative.  Negative for polydipsia, polyphagia and polyuria.  Genitourinary: Negative for decreased urine volume, difficulty urinating, dysuria, frequency and urgency.  Musculoskeletal: Negative for arthralgias and myalgias.  Skin: Negative.   Allergic/Immunologic: Negative.   Neurological: Negative for dizziness and headaches.  Hematological: Negative.   Psychiatric/Behavioral: Negative for confusion, hallucinations, sleep disturbance and suicidal ideas.  All other systems reviewed and are negative.        Observations/Objective: No vital signs or physical exam, this was a telephone or virtual health encounter.  Pt alert and oriented, answers all questions appropriately, and able to speak in full sentences.  Thick white coating to back of tongue noted in video.  Assessment and Plan: Tanda was seen today for thrush.  Diagnoses and all orders for this visit:  Thrush, oral Images consistent with oral thrush. Will treat with below. Pt aware to report any new, worsening, or persistent symptoms.  -     nystatin  (MYCOSTATIN) 100000 UNIT/ML suspension; Take 5 mLs (500,000 Units total) by mouth 4 (four) times daily for 7 days. Add 1 ml of lidocaine to each dose -     lidocaine (XYLOCAINE) 2 % solution; Use as directed 15 mLs in the mouth or throat every 4 (four) hours as needed for up to 7 days for mouth pain. Add 1 ml to each dose of nystatin  Oral pain Will add below to nystatin for pain control. Pt aware to withhold food and drink for 30 minutes after medications.  -     lidocaine (XYLOCAINE) 2 % solution; Use as directed 15 mLs in the mouth or throat every 4 (four) hours as needed for up to 7 days for mouth pain. Add 1 ml to each dose of nystatin     Follow Up Instructions: Return if symptoms worsen or fail to improve.    I discussed the assessment and treatment plan with the patient. The patient was provided an opportunity to ask questions and all were answered. The patient agreed with the plan and demonstrated an understanding of the instructions.   The patient was advised to call back or seek an in-person evaluation if the symptoms worsen or if the condition fails to improve as anticipated.  The above assessment and management plan was discussed with the patient. The patient verbalized understanding of and has agreed to the management plan. Patient is aware to call the clinic if they develop any new symptoms or if symptoms persist or worsen. Patient is aware when to return to the clinic for a follow-up visit. Patient educated on when it is appropriate to go to the emergency department.    I provided 15 minutes of non-face-to-face time during this encounter. The video started at 1520. The video ended at [place]. The other time was used for coordination of care.    Monia Pouch, FNP-C [facility] [address] [place], [place] [address] [phone] 01/22/2020

## 2020-03-12 ENCOUNTER — Emergency Department (HOSPITAL_COMMUNITY)
Admission: EM | Admit: 2020-03-12 | Discharge: 2020-03-12 | Disposition: A | Discharge status: Home / Self Care | Payer: Medicaid Other | Attending: Emergency Medicine | Admitting: Emergency Medicine

## 2020-03-12 ENCOUNTER — Other Ambulatory Visit: Payer: Self-pay

## 2020-03-12 ENCOUNTER — Encounter (HOSPITAL_COMMUNITY): Payer: Self-pay | Admitting: *Deleted

## 2020-03-12 ENCOUNTER — Emergency Department (HOSPITAL_COMMUNITY): Payer: Medicaid Other

## 2020-03-12 DIAGNOSIS — Z87891 Personal history of nicotine dependence: Secondary | ICD-10-CM | POA: Insufficient documentation

## 2020-03-12 DIAGNOSIS — S62666B Nondisplaced fracture of distal phalanx of right little finger, initial encounter for open fracture: Secondary | ICD-10-CM | POA: Diagnosis not present

## 2020-03-12 DIAGNOSIS — Y999 Unspecified external cause status: Secondary | ICD-10-CM | POA: Insufficient documentation

## 2020-03-12 DIAGNOSIS — Z79899 Other long term (current) drug therapy: Secondary | ICD-10-CM | POA: Diagnosis not present

## 2020-03-12 DIAGNOSIS — Y9289 Other specified places as the place of occurrence of the external cause: Secondary | ICD-10-CM | POA: Insufficient documentation

## 2020-03-12 DIAGNOSIS — Y9389 Activity, other specified: Secondary | ICD-10-CM | POA: Insufficient documentation

## 2020-03-12 DIAGNOSIS — Z7982 Long term (current) use of aspirin: Secondary | ICD-10-CM | POA: Diagnosis not present

## 2020-03-12 DIAGNOSIS — W230XXA Caught, crushed, jammed, or pinched between moving objects, initial encounter: Secondary | ICD-10-CM | POA: Insufficient documentation

## 2020-03-12 DIAGNOSIS — Z23 Encounter for immunization: Secondary | ICD-10-CM | POA: Diagnosis not present

## 2020-03-12 DIAGNOSIS — I10 Essential (primary) hypertension: Secondary | ICD-10-CM | POA: Insufficient documentation

## 2020-03-12 DIAGNOSIS — S6991XA Unspecified injury of right wrist, hand and finger(s), initial encounter: Secondary | ICD-10-CM | POA: Diagnosis present

## 2020-03-12 DIAGNOSIS — S62666A Nondisplaced fracture of distal phalanx of right little finger, initial encounter for closed fracture: Secondary | ICD-10-CM | POA: Diagnosis not present

## 2020-03-12 DIAGNOSIS — Z7902 Long term (current) use of antithrombotics/antiplatelets: Secondary | ICD-10-CM | POA: Diagnosis not present

## 2020-03-12 DIAGNOSIS — S62639B Displaced fracture of distal phalanx of unspecified finger, initial encounter for open fracture: Secondary | ICD-10-CM

## 2020-03-12 MED ORDER — CEPHALEXIN 500 MG PO CAPS
500.0000 mg | ORAL_CAPSULE | Freq: Once | ORAL | Status: AC
  Administered 2020-03-12: 23:00:00 500 mg via ORAL
  Filled 2020-03-12: qty 1

## 2020-03-12 MED ORDER — HYDROCODONE-ACETAMINOPHEN 5-325 MG PO TABS: 1.0000 | ORAL_TABLET | ORAL | 0 refills | Status: DC | PRN

## 2020-03-12 MED ORDER — CEPHALEXIN 500 MG PO CAPS
500.0000 mg | ORAL_CAPSULE | Freq: Four times a day (QID) | ORAL | 0 refills | Status: DC
Start: 2020-03-12 — End: 2020-08-31

## 2020-03-12 MED ORDER — HYDROCODONE-ACETAMINOPHEN 5-325 MG PO TABS
1.0000 | ORAL_TABLET | Freq: Once | ORAL | Status: AC
  Administered 2020-03-12: 1 via ORAL
  Filled 2020-03-12: qty 1

## 2020-03-12 MED ORDER — TETANUS-DIPHTH-ACELL PERTUSSIS 5-2.5-18.5 LF-MCG/0.5 IM SUSP
0.5000 mL | Freq: Once | INTRAMUSCULAR | Status: AC
  Administered 2020-03-12: 23:00:00 0.5 mL via INTRAMUSCULAR
  Filled 2020-03-12: qty 0.5

## 2020-03-12 NOTE — ED Notes (Signed)
Family in room  

## 2020-03-12 NOTE — ED Triage Notes (Signed)
Pt with right pinky finger injury over 24 hours ago after her finger got a car door closed on it.

## 2020-03-12 NOTE — Discharge Instructions (Addendum)
Keep your finger clean, covered and wear the finger splint at all times to protect your injury.  I do recommend you changing the dressing once daily however to make sure there is no sign of infection in the wound such as increased redness, swelling or drainage of pus.  Take the full course of the antibiotics prescribed.  You may take the hydrocodone if needed for pain relief.  This medication will make you drowsy, do not drive within 4 hours of taking this medication.

## 2020-03-12 NOTE — ED Provider Notes (Signed)
[facility] EMERGENCY DEPARTMENT Provider Note   CSN: [id number] Arrival date & time: 03/12/20  2131     History Chief Complaint  Patient presents with  . Finger Injury    Kaitlin Torres is a 39 y.o. female presenting for evaluation of the right distal pinky finger injury.  She caught this finger in a car door yesterday evening and sustained a laceration along with increased pain and swelling.  She has kept the finger clean, dry and covered but has persistent pain which prompts her visit here.  She has had no medications or other treatments for her injury.  She denies numbness distal to the injury site.  She denies any other injury.  She is unsure of her tetanus status.   The history is provided by the patient.       Past Medical History:  Diagnosis Date  . Anxiety   . 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 Cough   . Cyst of brain   . Generalized headaches   . Heart attack ([place]) 04/2019  . Hyperlipidemia   . Hypertension   . Nasal congestion   . PCOS (polycystic ovarian syndrome)   . Rectal pain     Patient Active Problem List   Diagnosis Date Noted  . Essential (primary) hypertension 06/10/2019  . Mixed hyperlipidemia 06/10/2019  . GAD (generalized anxiety disorder) 06/10/2019  . Vitamin D deficiency 06/10/2019  . PCOS (polycystic ovarian syndrome) 06/10/2019  . 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 NSTEMI (non-ST elevated myocardial infarction) ([place]) 05/08/2019  . Tobacco abuse 04/05/2018  . Obesity (BMI 30.0-34.9) 04/05/2018  . Abnormal CT scan, head 01/16/2018  . Irregular periods 11/14/2017    Past Surgical History:  Procedure Laterality Date  . ANKLE SURGERY  july 2005   screws placed in right ankle   . CORONARY STENT INTERVENTION N/A 05/08/2019   Procedure: CORONARY STENT INTERVENTION;  Surgeon: Martinique, Peter M, MD;  Location: [place] [facility];  Service: Cardiovascular;  Laterality: N/A;  . FRACTURE SURGERY  2005   right ankle  . HEMORRHOID SURGERY    . HEMORRHOID SURGERY   2015  . INCISION AND DRAINAGE BREAST ABSCESS  november 2011   left breast   . LEFT HEART CATH AND CORONARY ANGIOGRAPHY N/A 05/08/2019   Procedure: LEFT HEART CATH AND CORONARY ANGIOGRAPHY;  Surgeon: Martinique, Peter M, MD;  Location: [place] [facility];  Service: Cardiovascular;  Laterality: N/A;     OB History    Gravida  3   Para  3   Term  3   Preterm      AB      Living  3     SAB      TAB      Ectopic      Multiple      Live Births  3           Family History  Problem Relation Age of Onset  . Hypertension Mother   . Heart disease Father        Cardiac arrest 2006  . Diabetes Father   . Hyperlipidemia Father   . Stroke Father   . Diabetes Paternal Grandmother   . Cancer Maternal Grandmother        cervical  . Aneurysm Brother     Social History   Tobacco Use  . Smoking status: Former Smoker    Packs/day: 0.10    Years: 26.00    Pack years: 2.60    Types: Cigarettes  .  Smokeless tobacco: Never Used  . Tobacco comment: 4 cigs per day   Substance Use Topics  . Alcohol use: No    Comment: quit 2018  . Drug use: No    Home Medications Prior to Admission medications   Medication Sig Start Date End Date Taking? Authorizing Provider  aspirin 81 MG EC tablet Take 1 tablet (81 mg total) by mouth daily. 05/16/19  Yes Aslam, Loralyn Freshwater, MD  labetalol (NORMODYNE) 100 MG tablet Take 1 tablet (100 mg total) by mouth 2 (two) times daily. 10/15/19  Yes Elouise Munroe, MD  nitroGLYCERIN (NITROSTAT) 0.4 MG SL tablet Place 1 tablet (0.4 mg total) under the tongue every 5 (five) minutes as needed for chest pain. Up to 3 times with each episode 12/31/19  Yes Elouise Munroe, MD  ticagrelor (BRILINTA) 90 MG TABS tablet Take 1 tablet (90 mg total) by mouth 2 (two) times daily. 10/15/19  Yes Elouise Munroe, MD  cephALEXin (KEFLEX) 500 MG capsule Take 1 capsule (500 mg total) by mouth 4 (four) times daily. 03/12/20   Evalee Jefferson, PA-C  HYDROcodone-acetaminophen  (NORCO/VICODIN) 5-325 MG tablet Take 1 tablet by mouth every 4 (four) hours as needed for moderate pain. 03/12/20   Evalee Jefferson, PA-C    Allergies    Patient has no known allergies.  Review of Systems   Review of Systems  Constitutional: Negative for fever.  Musculoskeletal: Positive for arthralgias. Negative for joint swelling and myalgias.  Skin: Positive for wound.  Neurological: Negative for weakness and numbness.    Physical Exam Updated Vital Signs BP (!) 141/90   Pulse 75   Temp 98.3 F (36.8 C) (Oral)   Ht 5\' 9"  (1.753 m)   Wt 101.6 kg   LMP 03/05/2020   SpO2 100%   BMI 33.08 kg/m   Physical Exam Constitutional:      Appearance: She is well-developed.  HENT:     Head: Atraumatic.  Cardiovascular:     Pulses: Normal pulses.     Comments: Pulses equal bilaterally Musculoskeletal:        General: Tenderness and signs of injury present.     Right hand: Swelling and laceration present. Normal capillary refill.     Comments: There is a linear laceration of the right fifth finger mid volar tuft.  It is hemostatic.  Tenderness and swelling noted.  Nail plate is intact.  Skin:    General: Skin is warm and dry.  Neurological:     Mental Status: She is alert.     Sensory: No sensory deficit.     Deep Tendon Reflexes: Reflexes normal.     ED Results / Procedures / Treatments   Labs (all labs ordered are listed, but only abnormal results are displayed) Labs Reviewed - No data to display  EKG None  Radiology DG Finger Little Right  Result Date: 03/12/2020 CLINICAL DATA:  Injury EXAM: RIGHT LITTLE FINGER 2+V COMPARISON:  None. FINDINGS: No subluxation. Acute nondisplaced fracture involving the tuft of the distal phalanx. No radiopaque foreign body IMPRESSION: Acute nondisplaced tuft fracture of the fifth distal phalanx Electronically Signed   By: Donavan Foil M.D.   On: 03/12/2020 22:09    Procedures Procedures (including critical care time)  Medications  Ordered in ED Medications  HYDROcodone-acetaminophen (NORCO/VICODIN) 5-325 MG per tablet 1 tablet (has no administration in time range)  cephALEXin (KEFLEX) capsule 500 mg (500 mg Oral Given 03/12/20 2300)  Tdap (BOOSTRIX) injection 0.5 mL (0.5 mLs Intramuscular Given  03/12/20 2259)    ED Course  I have reviewed the triage vital signs and the nursing notes.  Pertinent labs & imaging results that were available during my care of the patient were reviewed by me and considered in my medical decision making (see chart for details).    MDM Rules/Calculators/A&P                     Imaging reviewed and discussed with patient.  She has technically an open distal tuft fracture of her fifth finger.  However the laceration is mid phalanx and possibly does not communicate with actual bony injury.  Her wound was cleaned using Betadine and sterile water.  As Steri-Strip was applied for close approximation.  Dressing and finger splint added.  She is given referral to Dr. Aline Brochure for follow-up care of this injury.  Her tetanus was updated.  Strict return precautions were outlined.  She was placed on Keflex with first dose given here.  Final Clinical Impression(s) / ED Diagnoses Final diagnoses:  Open fracture of tuft of distal phalanx of finger    Rx / DC Orders ED Discharge Orders         Ordered    cephALEXin (KEFLEX) 500 MG capsule  4 times daily     03/12/20 2301    HYDROcodone-acetaminophen (NORCO/VICODIN) 5-325 MG tablet  Every 4 hours PRN,   Status:  Discontinued     03/12/20 2301    HYDROcodone-acetaminophen (NORCO/VICODIN) 5-325 MG tablet  Every 4 hours PRN     03/12/20 2315           Evalee Jefferson, PA-C 03/12/20 2322    Fredia Sorrow, MD 03/18/20 [phone]

## 2020-03-27 ENCOUNTER — Telehealth (INDEPENDENT_AMBULATORY_CARE_PROVIDER_SITE_OTHER): Payer: Medicaid Other | Admitting: Internal Medicine

## 2020-03-27 ENCOUNTER — Encounter: Payer: Self-pay | Admitting: Internal Medicine

## 2020-03-27 ENCOUNTER — Telehealth: Payer: Self-pay | Admitting: *Deleted

## 2020-03-27 VITALS — Ht 69.0 in

## 2020-03-27 DIAGNOSIS — Z79899 Other long term (current) drug therapy: Secondary | ICD-10-CM

## 2020-03-27 DIAGNOSIS — I10 Essential (primary) hypertension: Secondary | ICD-10-CM

## 2020-03-27 DIAGNOSIS — E7800 Pure hypercholesterolemia, unspecified: Secondary | ICD-10-CM | POA: Diagnosis not present

## 2020-03-27 DIAGNOSIS — I2510 Atherosclerotic heart disease of native coronary artery without angina pectoris: Secondary | ICD-10-CM

## 2020-03-27 DIAGNOSIS — Z720 Tobacco use: Secondary | ICD-10-CM

## 2020-03-27 DIAGNOSIS — E782 Mixed hyperlipidemia: Secondary | ICD-10-CM

## 2020-03-27 DIAGNOSIS — E669 Obesity, unspecified: Secondary | ICD-10-CM

## 2020-03-27 DIAGNOSIS — I214 Non-ST elevation (NSTEMI) myocardial infarction: Secondary | ICD-10-CM

## 2020-03-27 NOTE — Telephone Encounter (Signed)
Left message to call back to review AVS from today's visit  STOP BRILINTA 05/07/2020 CONTINUE ASA INDEFINITELY F/U 6 MONTHS

## 2020-03-27 NOTE — Patient Instructions (Addendum)
Medication Instructions:  STOP BRLINITA 05/07/2020   CONTINUE ASPIRIN INDEFINITELY    *If you need a refill on your cardiac medications before your next appointment, please call your pharmacy*  Lab Work: NONE   Testing/Procedures: NONE   Follow-Up: At [facility], you and your health needs are our priority.  As part of our continuing mission to provide you with exceptional heart care, we have created designated Provider Care Teams.  These Care Teams include your primary Cardiologist (physician) and Advanced Practice Providers (APPs -  Physician Assistants and Nurse Practitioners) who all work together to provide you with the care you need, when you need it.  We recommend signing up for the patient portal called "MyChart".  Sign up information is provided on this After Visit Summary.  MyChart is used to connect with patients for Virtual Visits (Telemedicine).  Patients are able to view lab/test results, encounter notes, upcoming appointments, etc.  Non-urgent messages can be sent to your provider as well.   To learn more about what you can do with MyChart, go to [email or web address].    Your next appointment:   6 month(s) You will receive a reminder letter in the mail two months in advance. If you don't receive a letter, please call our office to schedule the follow-up appointment.   The format for your next appointment:   In Person  Provider:   You may see Elouise Munroe, MD or one of the following Advanced Practice Providers on your designated Care Team:    Rosaria Ferries, PA-C  Jory Sims, DNP, ANP  Cadence Kathlen Mody, NP

## 2020-04-01 NOTE — Telephone Encounter (Signed)
Reviewed information with patient.

## 2020-05-04 NOTE — Progress Notes (Signed)
Virtual Visit via Telephone Note   This visit type was conducted due to national recommendations for restrictions regarding the COVID-19 Pandemic (e.g. social distancing) in an effort to limit this patient's exposure and mitigate transmission in our community.  Due to her co-morbid illnesses, this patient is at least at moderate risk for complications without adequate follow up.  This format is felt to be most appropriate for this patient at this time.  The patient did not have access to video technology/had technical difficulties with video requiring transitioning to audio format only (telephone).  All issues noted in this document were discussed and addressed.  No physical exam could be performed with this format.  Please refer to the patient's chart for her  consent to telehealth for [facility].   The patient was identified using 2 identifiers.  Date:  03/27/2020   ID:  Kaitlin Torres, DOB Apr 16, 1981, MRN [id number]  Patient Location: Home Provider Location: Office  PCP:  Patient, No Pcp Per  Cardiologist:  Elouise Munroe, MD  Electrophysiologist:  None   Evaluation Performed:  Follow-Up Visit  Chief Complaint:  F/u NSTEMI s/p PCI to RCA and circ  History of Present Illness:    Kaitlin Torres is a 39 y.o. female with HTN, HLD, and early family history of MI and SCD who presents today for post hospital follow up after NSTEMI with stent x 2 to RCA and circ system 05/08/2019.  Had COVID 19 infection in early February 2021, recovered.   We reviewed duration of DAPT today. She has been overall well. She takes nitro for rare chest pain which she attributes to anxiety. She denies SOB, palpitations, PND, orthopnea.   The patient does not have symptoms concerning for COVID-19 infection (fever, chills, cough, or new shortness of breath).    Past Medical History:  Diagnosis Date  . Anxiety   . 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 Cough   . Cyst of brain   . Generalized headaches   . Heart  attack ([place]) 04/2019  . Hyperlipidemia   . Hypertension   . Nasal congestion   . PCOS (polycystic ovarian syndrome)   . Rectal pain    Past Surgical History:  Procedure Laterality Date  . ANKLE SURGERY  july 2005   screws placed in right ankle   . CORONARY STENT INTERVENTION N/A 05/08/2019   Procedure: CORONARY STENT INTERVENTION;  Surgeon: Martinique, Marselino Torres M, MD;  Location: [place] [facility];  Service: Cardiovascular;  Laterality: N/A;  . FRACTURE SURGERY  2005   right ankle  . HEMORRHOID SURGERY    . HEMORRHOID SURGERY  2015  . INCISION AND DRAINAGE BREAST ABSCESS  november 2011   left breast   . LEFT HEART CATH AND CORONARY ANGIOGRAPHY N/A 05/08/2019   Procedure: LEFT HEART CATH AND CORONARY ANGIOGRAPHY;  Surgeon: Kaitlin Torres M, MD;  Location: [place] [facility];  Service: Cardiovascular;  Laterality: N/A;     Current Meds  Medication Sig  . aspirin 81 MG EC tablet Take 1 tablet (81 mg total) by mouth daily.  . cephALEXin (KEFLEX) 500 MG capsule Take 1 capsule (500 mg total) by mouth 4 (four) times daily.  Marland Kitchen HYDROcodone-acetaminophen (NORCO/VICODIN) 5-325 MG tablet Take 1 tablet by mouth every 4 (four) hours as needed for moderate pain.  Marland Kitchen labetalol (NORMODYNE) 100 MG tablet Take 1 tablet (100 mg total) by mouth 2 (two) times daily.  . nitroGLYCERIN (NITROSTAT) 0.4 MG SL tablet Place 1 tablet (0.4 mg  total) under the tongue every 5 (five) minutes as needed for chest pain. Up to 3 times with each episode  . ticagrelor (BRILINTA) 90 MG TABS tablet Take 1 tablet (90 mg total) by mouth 2 (two) times daily. (Patient taking differently: Take 90 mg by mouth 2 (two) times daily. TO STOP 05/07/2020)     Allergies:   Patient has no known allergies.   Social History   Tobacco Use  . Smoking status: Former Smoker    Packs/day: 0.10    Years: 26.00    Pack years: 2.60    Types: Cigarettes  . Smokeless tobacco: Never Used  . Tobacco comment: 4 cigs per day   Vaping Use  . Vaping Use:  Every day  . Substances: Nicotine  Substance Use Topics  . Alcohol use: No    Comment: quit 2018  . Drug use: No     Family Hx: The patient's family history includes Aneurysm in her brother; Cancer in her maternal grandmother; Diabetes in her father and paternal grandmother; Heart disease in her father; Hyperlipidemia in her father; Hypertension in her mother; Stroke in her father.  ROS:   Please see the history of present illness.     All other systems reviewed and are negative.   Prior CV studies:   The following studies were reviewed today:    Labs/Other Tests and Data Reviewed:    EKG:  No ECG reviewed.  Recent Labs: 05/08/2019: Magnesium 1.9 06/10/2019: ALT 15; BUN 9; Creatinine, Ser 0.81; Hemoglobin 11.0; Platelets 332; Potassium 4.3; Sodium 140; TSH 1.260   Recent Lipid Panel Lab Results  Component Value Date/Time   CHOL 206 (H) 06/10/2019 11:35 AM   TRIG 389 (H) 06/10/2019 11:35 AM   HDL 22 (L) 06/10/2019 11:35 AM   CHOLHDL 9.4 (H) 06/10/2019 11:35 AM   CHOLHDL 10.0 05/09/2019 03:57 AM   LDLCALC 106 (H) 06/10/2019 11:35 AM    Wt Readings from Last 3 Encounters:  03/12/20 224 lb (101.6 kg)  09/04/19 230 lb (104.3 kg)  08/05/19 222 lb 14.4 oz (101.1 kg)     Objective:    Vital Signs:  Ht 5\' 9"  (1.753 m)   LMP 03/05/2020   BMI 33.08 kg/m    VITAL SIGNS:  reviewed GEN:  no acute distress RESPIRATORY:  normal respiratory effort, no increased work of breathing NEURO:  alert and oriented x 3, speech normal PSYCH:  normal affect   ASSESSMENT & PLAN:    1. NSTEMI (non-ST elevated myocardial infarction) ([place])   2. Coronary artery disease involving native coronary artery of native heart without angina pectoris   3. Essential hypertension   4. Hypercholesterolemia   5. Tobacco abuse   6. Obesity (BMI 30.0-34.9)   7. Mixed hyperlipidemia   8. Medication management    NSTEMI - can discontinue Brilinta 05/07/20 after one year of DAPT. Indefinite ASA therapy,  81 mg daily.   HLD - atorvastatin on hold while she plans pregnancy. She continues to anticipate pregnancy but is not currently pregnant.   HTN - losartan on hold while planning pregnancy. Continue labetalol.   COVID-19 Education: The signs and symptoms of COVID-19 were discussed with the patient and how to seek care for testing (follow up with PCP or arrange E-visit).  The importance of social distancing was discussed today.  Time:   Today, I have spent 15 minutes with the patient with telehealth technology discussing the above problems.     Medication Adjustments/Labs and Tests Ordered: Current  medicines are reviewed at length with the patient today.  Concerns regarding medicines are outlined above.   Tests Ordered: No orders of the defined types were placed in this encounter.   Medication Changes: No orders of the defined types were placed in this encounter.   Follow Up: 6 mo  Signed, Elouise Munroe, MD  [place]

## 2020-05-12 DIAGNOSIS — B370 Candidal stomatitis: Secondary | ICD-10-CM | POA: Diagnosis not present

## 2020-05-12 DIAGNOSIS — S76011A Strain of muscle, fascia and tendon of right hip, initial encounter: Secondary | ICD-10-CM | POA: Diagnosis not present

## 2020-05-18 DIAGNOSIS — H5213 Myopia, bilateral: Secondary | ICD-10-CM | POA: Diagnosis not present

## 2020-05-19 ENCOUNTER — Other Ambulatory Visit: Payer: Self-pay | Admitting: *Deleted

## 2020-05-19 DIAGNOSIS — K1379 Other lesions of oral mucosa: Secondary | ICD-10-CM

## 2020-05-19 DIAGNOSIS — B370 Candidal stomatitis: Secondary | ICD-10-CM

## 2020-06-08 ENCOUNTER — Other Ambulatory Visit: Payer: Self-pay | Admitting: *Deleted

## 2020-06-08 DIAGNOSIS — B370 Candidal stomatitis: Secondary | ICD-10-CM

## 2020-06-08 DIAGNOSIS — K1379 Other lesions of oral mucosa: Secondary | ICD-10-CM

## 2020-06-16 ENCOUNTER — Other Ambulatory Visit: Payer: Self-pay | Admitting: Internal Medicine

## 2020-06-23 ENCOUNTER — Other Ambulatory Visit: Payer: Self-pay

## 2020-06-30 ENCOUNTER — Other Ambulatory Visit: Payer: Self-pay

## 2020-06-30 ENCOUNTER — Telehealth: Payer: Self-pay | Admitting: Internal Medicine

## 2020-06-30 MED ORDER — LABETALOL HCL 100 MG PO TABS: 100.0000 mg | ORAL_TABLET | Freq: Two times a day (BID) | ORAL | 8 refills | Status: DC

## 2020-06-30 NOTE — Telephone Encounter (Signed)
*  STAT* If patient is at the pharmacy, call can be transferred to refill team.   1. Which medications need to be refilled? (please list name of each medication and dose if known)  labetalol (NORMODYNE) 100 MG tablet 2x daily  2. Which pharmacy/location (including street and city if local pharmacy) is medication to be sent to? [place], [address]  3. Do they need a 30 day or 90 day supply? [place]

## 2020-08-31 ENCOUNTER — Encounter: Payer: Self-pay | Admitting: Family

## 2020-08-31 ENCOUNTER — Other Ambulatory Visit: Payer: Self-pay

## 2020-08-31 ENCOUNTER — Ambulatory Visit: Payer: Medicaid Other | Admitting: Family

## 2020-08-31 VITALS — BP 128/76 | HR 68 | Temp 97.3°F | Ht 69.0 in | Wt 206.4 lb

## 2020-08-31 DIAGNOSIS — K5900 Constipation, unspecified: Secondary | ICD-10-CM | POA: Diagnosis not present

## 2020-08-31 DIAGNOSIS — B370 Candidal stomatitis: Secondary | ICD-10-CM

## 2020-08-31 DIAGNOSIS — L301 Dyshidrosis [pompholyx]: Secondary | ICD-10-CM

## 2020-08-31 LAB — CBC WITH DIFFERENTIAL/PLATELET

## 2020-08-31 MED ORDER — CLOTRIMAZOLE 10 MG MT TROC: 10.0000 mg | Freq: Every day | OROMUCOSAL | 1 refills | Status: DC

## 2020-08-31 MED ORDER — NYSTATIN 100000 UNIT/ML MT SUSP: 5.0000 mL | Freq: Four times a day (QID) | OROMUCOSAL | 2 refills | Status: DC

## 2020-08-31 MED ORDER — TRIAMCINOLONE ACETONIDE 0.5 % EX OINT: 1.0000 "application " | TOPICAL_OINTMENT | Freq: Two times a day (BID) | CUTANEOUS | 0 refills | Status: DC

## 2020-08-31 MED ORDER — POLYETHYLENE GLYCOL 3350 17 GM/SCOOP PO POWD: 17.0000 g | Freq: Two times a day (BID) | ORAL | 1 refills | Status: DC | PRN

## 2020-08-31 MED ORDER — FLUCONAZOLE 150 MG PO TABS: 150.0000 mg | ORAL_TABLET | Freq: Every day | ORAL | 1 refills | Status: DC

## 2020-08-31 NOTE — Patient Instructions (Addendum)
Oral Thrush, Adult  Oral thrush, also called oral candidiasis, is a fungal infection that develops in the mouth and throat and on the tongue. It causes white patches to form on the mouth and tongue. Thrush is most common in older adults, but it can occur at any age. Many cases of thrush are mild, but this infection can also be serious. Thrush can be a repeated (recurrent) problem for certain people who have a weak body defense system (immune system). The weakness can be caused by chronic illnesses, or by taking medicines that limit the body's ability to fight infection. If a person has difficulty fighting infection, the fungus that causes thrush can spread through the body. This can cause life-threatening blood or organ infections. What are the causes? This condition is caused by a fungus (yeast) called Candida albicans.  This fungus is normally present in small amounts in the mouth and on other mucous membranes. It usually causes no harm.  If conditions are present that allow the fungus to grow without control, it invades surrounding tissues and becomes an infection.  Other Candida species can also lead to thrush (rare). What increases the risk? This condition is more likely to develop in:  People with a weakened immune system.  Older adults.  People with HIV (human immunodeficiency virus).  People with diabetes.  People with dry mouth (xerostomia).  Pregnant women.  People with poor dental care, especially people who have false teeth.  People who use antibiotic medicines. What are the signs or symptoms? Symptoms of this condition can vary from mild and moderate to severe and persistent. Symptoms may include:  A burning feeling in the mouth and throat. This can occur at the start of a thrush infection.  White patches that stick to the mouth and tongue. The tissue around the patches may be red, raw, and painful. If rubbed (during tooth brushing, for example), the patches and the  tissue of the mouth may bleed easily.  A bad taste in the mouth or difficulty tasting foods.  A cottony feeling in the mouth.  Pain during eating and swallowing.  Poor appetite.  Cracking at the corners of the mouth. How is this diagnosed? This condition is diagnosed based on:  Physical exam. Your health care provider will look in your mouth.  Health history. Your health care provider will ask you questions about your health. How is this treated? This condition is treated with medicines called antifungals, which prevent the growth of fungi. These medicines are either applied directly to the affected area (topical) or swallowed (oral). The treatment will depend on the severity of the condition. Mild thrush Mild cases of thrush may clear up with the use of an antifungal mouth rinse or lozenges. Treatment usually lasts about 14 days. Moderate to severe thrush  More severe thrush infections that have spread to the esophagus are treated with an oral antifungal medicine. A topical antifungal medicine may also be used.  For some severe infections, treatment may need to continue for more than 14 days.  Oral antifungal medicines are rarely used during pregnancy because they may be harmful to the unborn child. If you are pregnant, talk with your health care provider about options for treatment. Persistent or recurrent thrush For cases of thrush that do not go away or keep coming back:  Treatment may be needed twice as long as the symptoms last.  Treatment will include both oral and topical antifungal medicines.  People with a weakened immune system can take   an antifungal medicine on a continuous basis to prevent thrush infections. It is important to treat conditions that make a person more likely to get thrush, such as diabetes or HIV. Follow these instructions at home: Medicines  Take over-the-counter and prescription medicines only as told by your health care provider.  Talk with  your health care provider about an over-the-counter medicine called gentian violet, which kills bacteria and fungi. Relieving soreness and discomfort To help reduce the discomfort of thrush:  Drink cold liquids such as water or iced tea.  Try flavored ice treats or frozen juices.  Eat foods that are easy to swallow, such as gelatin, ice cream, or custard.  Try drinking from a straw if the patches in your mouth are painful.  General instructions  Eat plain, unflavored yogurt as directed by your health care provider. Check the label to make sure the yogurt contains live cultures. This yogurt can help healthy bacteria to grow in the mouth and can stop the growth of the fungus that causes thrush.  If you wear dentures, remove the dentures before going to bed, brush them vigorously, and soak them in a cleaning solution as directed by your health care provider.  Rinse your mouth with a warm salt-water mixture several times a day. To make a salt-water mixture, completely dissolve 1/2-1 tsp of salt in 1 cup of warm water. Contact a health care provider if:  Your symptoms are getting worse or are not improving within 7 days of starting treatment.  You have symptoms of a spreading infection, such as white patches on the skin outside of the mouth. This information is not intended to replace advice given to you by your health care provider. Make sure you discuss any questions you have with your health care provider. Document Revised: 01/26/2018 Document Reviewed: 07/18/2016 Elsevier Patient Education  2020 [place] Eczema Dyshidrotic eczema (pompholyx) is a type of eczema that causes very itchy (pruritic), fluid-filled blisters (vesicles) to form on the hands and feet. It can affect people of any age, but is more common before the age of 9. There is no cure, but treatment and certain lifestyle changes can help relieve symptoms. What are the causes? The cause of this condition is  not known. What increases the risk? You are more likely to develop this condition if:  You wash your hands frequently.  You have a personal history or family history of eczema, allergies, asthma, or hay fever.  You are allergic to metals such as nickel or cobalt.  You work with cement.  You smoke. What are the signs or symptoms? Symptoms of this condition may affect the hands, feet, or both. Symptoms may come and go (recur), and may include:  Severe itching, which may happen before blisters appear.  Blisters. These may form suddenly. ? In the early stages, blisters may form near the fingertips. ? In severe cases, blisters may grow to large blister masses (bullae). ? Blisters resolve in 2-3 weeks without bursting. This is followed by a dry phase in which itching eases.  Pain and swelling.  Cracks or long, narrow openings (fissures) in the skin.  Severe dryness.  Ridges on the nails. How is this diagnosed? This condition may be diagnosed based on:  A physical exam.  Your symptoms.  Your medical history.  Skin scrapings to rule out a fungal infection.  Testing a swab of fluid for bacteria (culture).  Removing and checking a small piece of skin (biopsy) in order to test  for infection or to rule out other conditions.  Skin patch tests. These tests involve taking patches that contain possible allergens and placing them on your back. Your health care provider will wait a few days and then check to see if an allergic reaction occurred. These tests may be done if your health care provider suspects allergic reactions, or to rule out other types of eczema. You may be referred to a health care provider who specializes in the skin (dermatologist) to help diagnose and treat this condition. How is this treated? There is no cure for this condition, but treatment can help relieve symptoms. Depending on how many blisters you have and how severe they are, your health care provider may  suggest:  Avoiding allergens, irritants, or triggers that worsen symptoms. This may involve lifestyle changes such as: ? Using different lotions or soaps. ? Avoiding hot weather or places that will cause you to sweat a lot. ? Managing stress with coping techniques such as relaxation and exercise, and asking for help when you need it. ? Diet changes as recommended by your health care provider.  Using a clean, damp towel (cool compress) to relieve symptoms.  Soaking in a bath that contains a type of salt that relieves irritation (aluminum acetate soaks).  Medicine taken by mouth to reduce itching (oral antihistamines).  Medicine applied to the skin to reduce swelling and irritation (topical corticosteroids).  Medicine that reduces the activity of the body's disease-fighting system (immunosuppressants) to treat inflammation. This may be given in severe cases.  Antibiotic medicines to treat bacterial infection.  Light therapy (phototherapy). This involves shining ultraviolet (UV) light on affected skin in order to reduce itchiness and inflammation. Follow these instructions at home: Bathing and skin care   Wash skin gently. After bathing or washing your hands, pat your skin dry. Avoid rubbing your skin.  Remove all jewelry before bathing. If the skin under the jewelry stays wet, blisters may form or get worse.  Apply cool compresses as told by your health care provider: ? Soak a clean towel in cool water. ? Wring out excess water until towel is damp. ? Place the towel over affected skin. Leave the towel on for 20 minutes at a time, 2-3 times a day.  Use mild soaps, cleansers, and lotions that do not contain dyes, perfumes, or other irritants.  Keep your skin hydrated. To do this: ? Avoid very hot water. Take lukewarm baths or showers. ? Apply moisturizer within three minutes of bathing. This locks in moisture. Medicines  Take and apply over-the-counter and prescription medicines  only as told by your health care provider.  If you were prescribed antibiotic medicine, take or apply it as told by your health care provider. Do not stop using the antibiotic even if you start to feel better. General instructions  Identify and avoid triggers and allergens.  Keep fingernails short to avoid breaking open the skin while scratching.  Use waterproof gloves to protect your hands when doing work that keeps your hands wet for a long time.  Wear socks to keep your feet dry.  Do not use any products that contain nicotine or tobacco, such as cigarettes and e-cigarettes. If you need help quitting, ask your health care provider.  Keep all follow-up visits as told by your health care provider. This is important. Contact a health care provider if:  You have symptoms that do not go away.  You have signs of infection, such as: ? Crusting, pus, or a  bad smell. ? More redness, swelling, or pain. ? Increased warmth in the affected area. Summary  Dyshidrotic eczema (pompholyx) is a type of eczema that causes very itchy (pruritic), fluid-filled blisters (vesicles) to form on the hands and feet.  The cause of this condition is not known.  There is no cure for this condition, but treatment can help relieve symptoms. Treatment depends on how many blisters you have and how severe they are.  Use mild soaps, cleansers, and lotions that do not contain dyes, perfumes, or other irritants. Keep your skin hydrated. This information is not intended to replace advice given to you by your health care provider. Make sure you discuss any questions you have with your health care provider. Document Revised: 02/13/2019 Document Reviewed: 03/09/2017 Elsevier Patient Education  [place].

## 2020-08-31 NOTE — Progress Notes (Signed)
Subjective:    Patient ID: Kaitlin Torres, female    DOB: 1980-11-19, 39 y.o.   MRN: [id number]  Chief Complaint  Patient presents with  . Thrush    oral since feburary off and on  . Pruritis    on hands x 3 mths     HPI  PT presents to the office today with oral thrush on her tongue. She reports she has had this on and off since February after COVID. She went to the Urgent Care in July and was given nystatin mouthwash and diflucan for 5 days. She reports this resolved, but returned about a month later. She refilled her Diflucan in September and it resolved and returned about a week ago.   She reports salty, citric foods hurt to eat.  Denies any recent antibiotics.   She also reports intense pruritis on hands that has been on going for 3 months. Denies any new chemicals or exposures.   She is also complaining of chronic constipation. She reports she usually has 1 BM a week. She has increased her fiber in her diet with no results.   Review of Systems  Gastrointestinal: Positive for constipation.  Skin: Positive for rash.  All other systems reviewed and are negative.      Objective:   Physical Exam Vitals reviewed.  Constitutional:      General: She is not in acute distress.    Appearance: She is well-developed.  HENT:     Head: Normocephalic and atraumatic.     Mouth/Throat:     Comments: White coating on tongue  Eyes:     Pupils: Pupils are equal, round, and reactive to light.  Neck:     Thyroid: No thyromegaly.  Cardiovascular:     Rate and Rhythm: Normal rate and regular rhythm.     Heart sounds: Normal heart sounds. No murmur heard.   Pulmonary:     Effort: Pulmonary effort is normal. No respiratory distress.     Breath sounds: Normal breath sounds. No wheezing.  Abdominal:     General: Bowel sounds are normal. There is no distension.     Palpations: Abdomen is soft.     Tenderness: There is no abdominal tenderness.  Musculoskeletal:        General: No  tenderness. Normal range of motion.     Cervical back: Normal range of motion and neck supple.  Skin:    General: Skin is warm and dry.     Findings: Rash (dry rash on bilateral hands and in between fingers) present.  Neurological:     Mental Status: She is alert and oriented to person, place, and time.     Cranial Nerves: No cranial nerve deficit.     Deep Tendon Reflexes: Reflexes are normal and symmetric.  Psychiatric:        Behavior: Behavior normal.        Thought Content: Thought content normal.        Judgment: Judgment normal.     BP 128/76   Pulse 68   Temp (!) 97.3 F (36.3 C) (Temporal)   Ht _0  (1.753 m)   Wt 206 lb 6.4 oz (93.6 kg)   SpO2 98%   BMI 30.48 kg/m        Assessment & Plan:  Kaitlin Torres comes in today with chief complaint of Thrush (oral since feburary off and on) and Pruritis (on hands x 3 mths )   Diagnosis and orders addressed:  1. Dyshidrotic eczema Avoid scratching  Kenalog BID - triamcinolone ointment (KENALOG) 0.5 %; Apply 1 application topically 2 (two) times daily.  Dispense: 30 g; Refill: 0 - CMP14+EGFR - CBC with Differential/Platelet - HIV Antibody (routine testing w rflx) - TSH  2. Oral thrush Mycelex and nystatin as needed Avoid salty and citric  foods  Rinse mouth with water after food - clotrimazole (MYCELEX) 10 MG troche; Take 1 tablet (10 mg total) by mouth 5 (five) times daily.  Dispense: 140 tablet; Refill: 1 - nystatin (MYCOSTATIN) 100000 UNIT/ML suspension; Take 5 mLs (500,000 Units total) by mouth 4 (four) times daily.  Dispense: 473 mL; Refill: 2 - fluconazole (DIFLUCAN) 150 MG tablet; Take 1 tablet (150 mg total) by mouth daily.  Dispense: 3 tablet; Refill: 1 - CMP14+EGFR - CBC with Differential/Platelet - HIV Antibody (routine testing w rflx) - TSH  3. Constipation, unspecified constipation type Force fluids Continue high fiber diet Encourage exercise  - polyethylene glycol powder (GLYCOLAX/MIRALAX)  17 GM/SCOOP powder; Take 17 g by mouth 2 (two) times daily as needed.  Dispense: 3350 g; Refill: 1 - CMP14+EGFR - CBC with Differential/Platelet - HIV Antibody (routine testing w rflx) - TSH   Labs pending to rule out cause.    Kaitlin Dun, FNP

## 2020-09-01 LAB — CBC WITH DIFFERENTIAL/PLATELET
Basophils Absolute: 0.1 10*3/uL (ref 0.0–0.2)
Basos: 1 %
EOS (ABSOLUTE): 0.1 10*3/uL (ref 0.0–0.4)
Eos: 2 %
Hematocrit: 34.2 % (ref 34.0–46.6)
Hemoglobin: 9.6 g/dL — ABNORMAL LOW (ref 11.1–15.9)
Immature Grans (Abs): 0 10*3/uL (ref 0.0–0.1)
Immature Granulocytes: 0 %
Lymphocytes Absolute: 2.1 10*3/uL (ref 0.7–3.1)
Lymphs: 32 %
MCH: 16.2 pg — ABNORMAL LOW (ref 26.6–33.0)
MCHC: 28.1 g/dL — ABNORMAL LOW (ref 31.5–35.7)
MCV: 58 fL — ABNORMAL LOW (ref 79–97)
Monocytes Absolute: 0.7 10*3/uL (ref 0.1–0.9)
Monocytes: 10 %
Neutrophils Absolute: 3.7 10*3/uL (ref 1.4–7.0)
Neutrophils: 55 %
Platelets: 423 10*3/uL (ref 150–450)
RBC: 5.93 x10E6/uL — ABNORMAL HIGH (ref 3.77–5.28)
RDW: 20.9 % — ABNORMAL HIGH (ref 11.7–15.4)
WBC: 6.8 10*3/uL (ref 3.4–10.8)

## 2020-09-01 LAB — HIV ANTIBODY (ROUTINE TESTING W REFLEX): HIV Screen 4th Generation wRfx: NONREACTIVE

## 2020-09-01 LAB — CMP14+EGFR
ALT: 13 IU/L (ref 0–32)
AST: 14 IU/L (ref 0–40)
Albumin/Globulin Ratio: 1.8 (ref 1.2–2.2)
Albumin: 4.4 g/dL (ref 3.8–4.8)
Alkaline Phosphatase: 71 IU/L (ref 44–121)
BUN/Creatinine Ratio: 18 (ref 9–23)
BUN: 12 mg/dL (ref 6–20)
Bilirubin Total: 0.3 mg/dL (ref 0.0–1.2)
CO2: 21 mmol/L (ref 20–29)
Calcium: 10 mg/dL (ref 8.7–10.2)
Chloride: 105 mmol/L (ref 96–106)
Creatinine, Ser: 0.65 mg/dL (ref 0.57–1.00)
GFR calc Af Amer: 130 mL/min/{1.73_m2} (ref >59)
GFR calc non Af Amer: 113 mL/min/{1.73_m2} (ref >59)
Globulin, Total: 2.4 g/dL (ref 1.5–4.5)
Glucose: 85 mg/dL (ref 65–99)
Potassium: 4.3 mmol/L (ref 3.5–5.2)
Sodium: 138 mmol/L (ref 134–144)
Total Protein: 6.8 g/dL (ref 6.0–8.5)

## 2020-09-01 LAB — TSH: TSH: 0.169 u[IU]/mL — ABNORMAL LOW (ref 0.450–4.500)

## 2020-09-04 ENCOUNTER — Other Ambulatory Visit: Payer: Self-pay | Admitting: Family

## 2020-09-08 ENCOUNTER — Other Ambulatory Visit: Payer: Self-pay | Admitting: Family

## 2020-09-08 DIAGNOSIS — D509 Iron deficiency anemia, unspecified: Secondary | ICD-10-CM | POA: Insufficient documentation

## 2020-09-08 LAB — SPECIMEN STATUS REPORT

## 2020-09-08 LAB — VITAMIN B12: Vitamin B-12: 415 pg/mL (ref 232–1245)

## 2020-09-08 LAB — IRON: Iron: 15 ug/dL — ABNORMAL LOW (ref 27–159)

## 2020-09-21 ENCOUNTER — Ambulatory Visit: Payer: Medicaid Other | Admitting: Internal Medicine

## 2020-10-26 IMAGING — CR CHEST - 2 VIEW
2 series · 2 of 2 positions shown · non-contrast
Comparison: 05/06/2019

CLINICAL DATA: Chest pain for the past 3 days

EXAM:
CHEST - 2 VIEW

[chest pa]
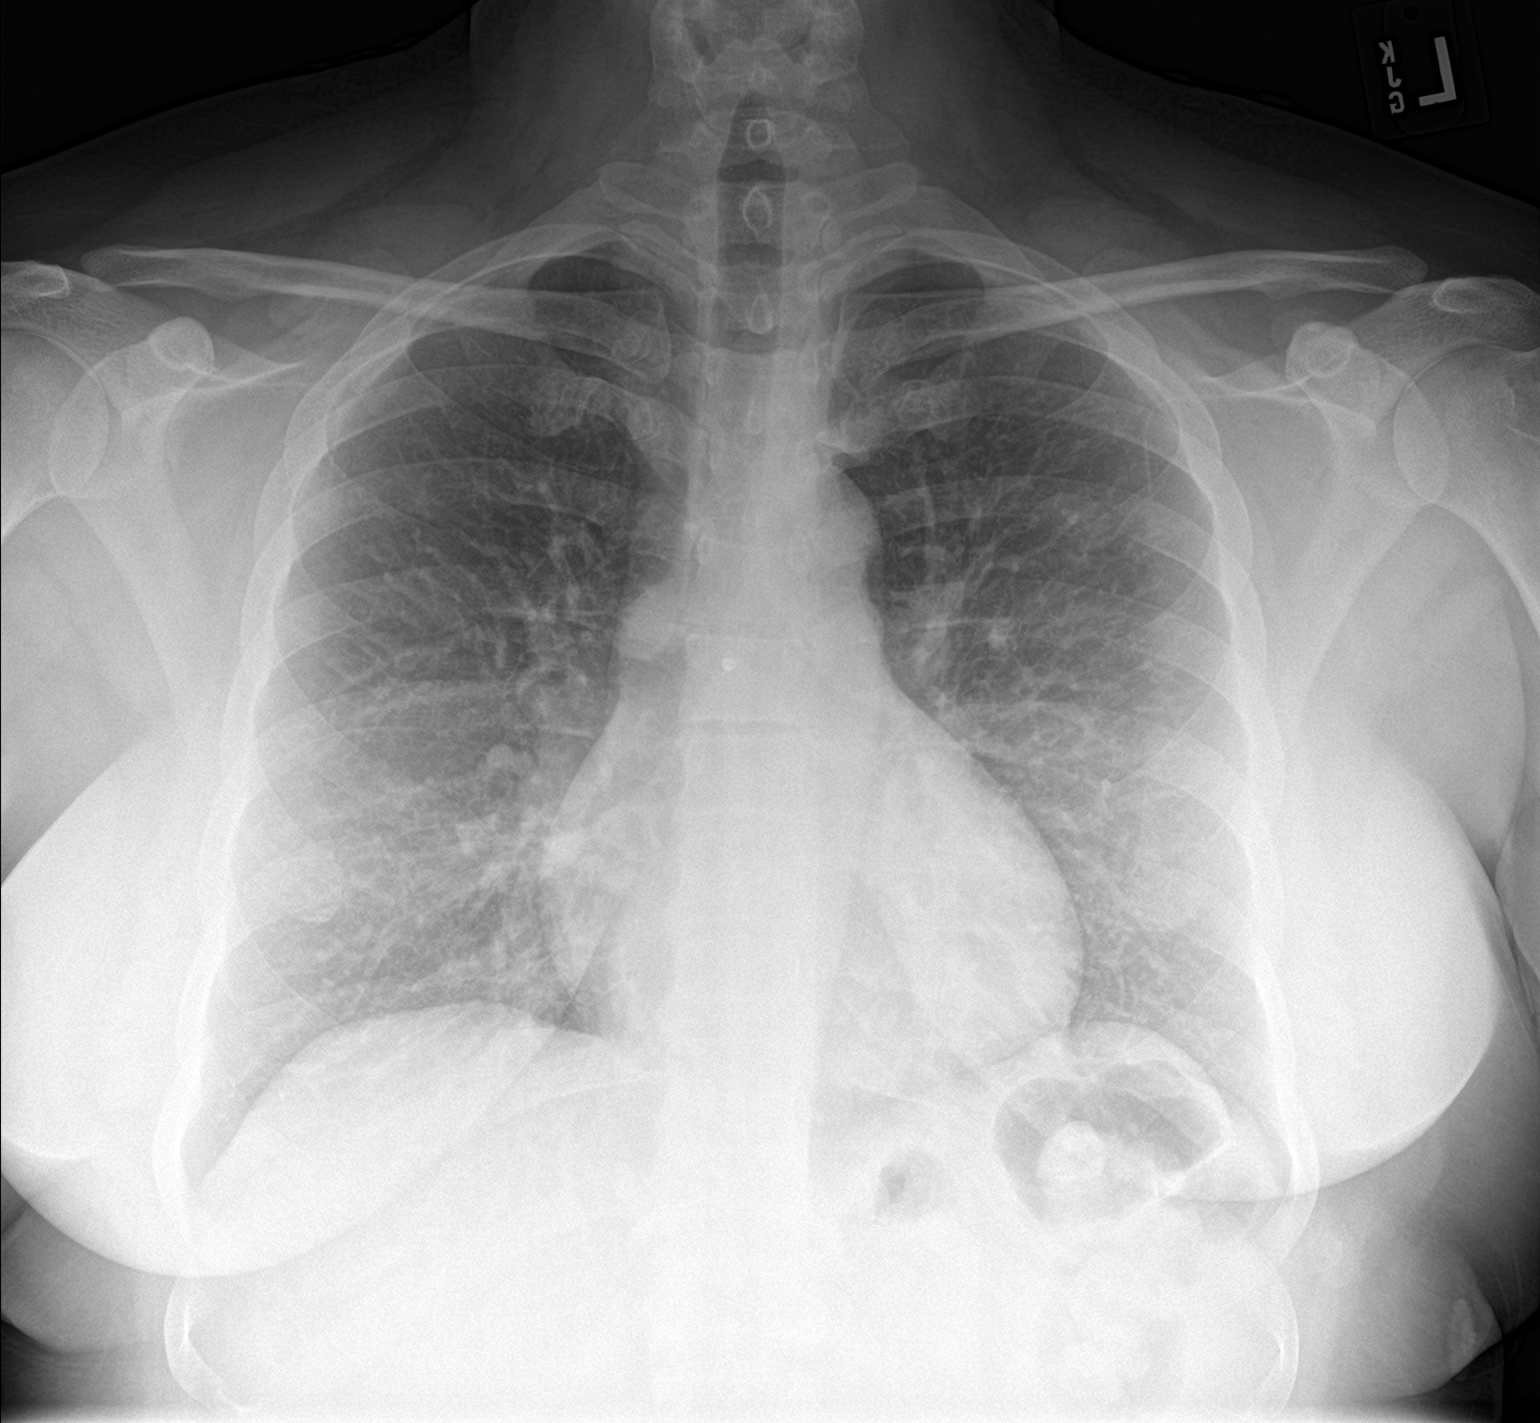

[chest lat]
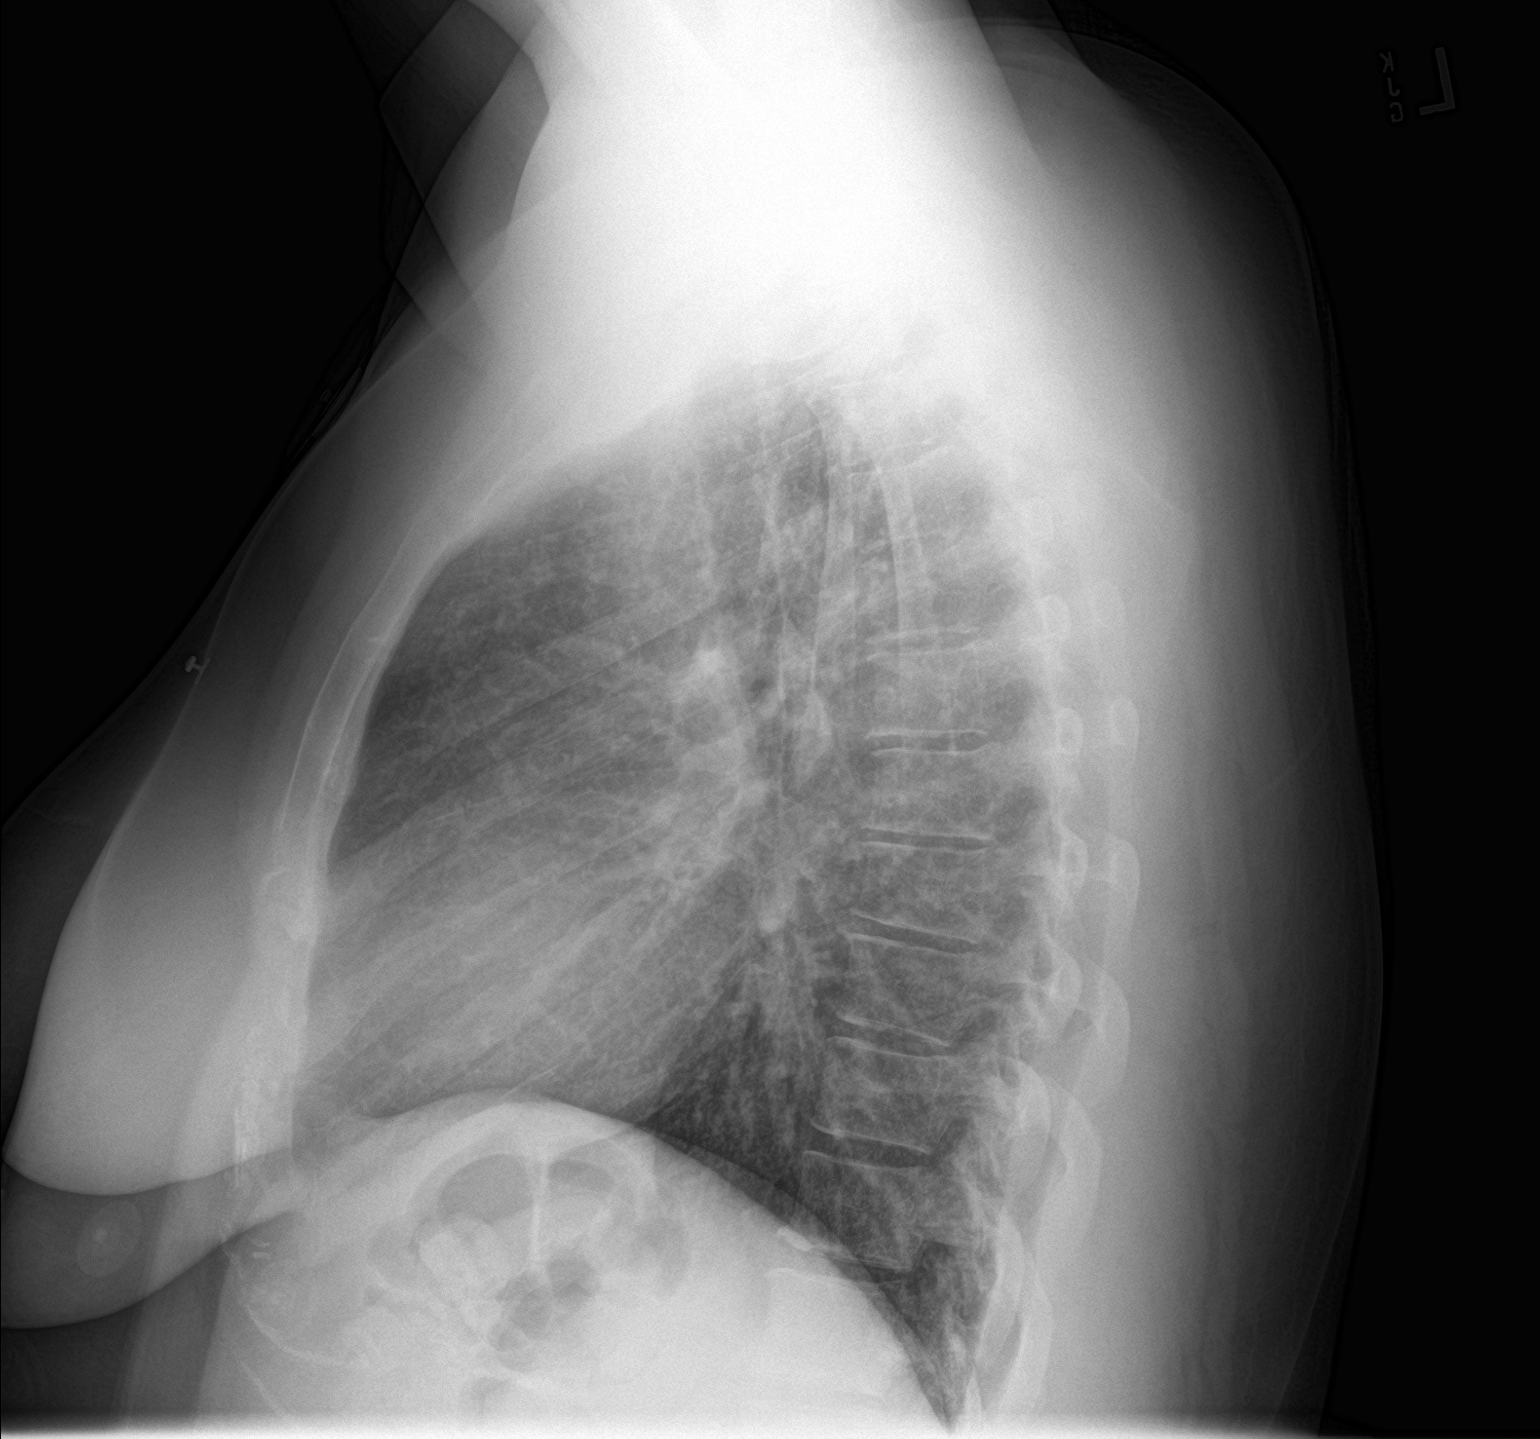

[2 of 2 positions shown; findings below may reference images not displayed]

FINDINGS: Interstitial coarsening. There is no edema, consolidation, effusion,
or pneumothorax. Normal heart size and mediastinal contours
IMPRESSION: Stable exam.  No evidence of acute disease.

## 2020-11-04 ENCOUNTER — Other Ambulatory Visit: Payer: Self-pay

## 2020-11-04 ENCOUNTER — Encounter: Payer: Self-pay | Admitting: Nurse Practitioner

## 2020-11-04 ENCOUNTER — Ambulatory Visit (INDEPENDENT_AMBULATORY_CARE_PROVIDER_SITE_OTHER): Payer: Medicaid Other | Admitting: Nurse Practitioner

## 2020-11-04 VITALS — BP 130/86 | HR 91 | Temp 97.8°F | Ht 69.0 in | Wt 210.4 lb

## 2020-11-04 DIAGNOSIS — N6452 Nipple discharge: Secondary | ICD-10-CM | POA: Insufficient documentation

## 2020-11-04 DIAGNOSIS — M5489 Other dorsalgia: Secondary | ICD-10-CM | POA: Diagnosis not present

## 2020-11-04 DIAGNOSIS — M549 Dorsalgia, unspecified: Secondary | ICD-10-CM | POA: Insufficient documentation

## 2020-11-04 MED ORDER — NAPROXEN 500 MG PO TABS: 500.0000 mg | ORAL_TABLET | Freq: Two times a day (BID) | ORAL | 0 refills | Status: DC

## 2020-11-04 MED ORDER — PREDNISONE 10 MG (21) PO TBPK: ORAL_TABLET | ORAL | 0 refills | Status: DC

## 2020-11-04 NOTE — Assessment & Plan Note (Signed)
Right breast discharge not well controlled.  This is new for patient in the last 1 year.  Completed diagnostic mammogram orders, ultrasound orders, CBC and CMP.  Pending results  Education provided with printed handouts given.  Follow-up in 2- 4 weeks.  To evaluate results and further treatments as needed.

## 2020-11-04 NOTE — Progress Notes (Signed)
Acute Office Visit  Subjective:    Patient ID: Kaitlin Torres, female    DOB: 10-23-1981, 39 y.o.   MRN: [id number]  Chief Complaint  Patient presents with  . Breast Problem    RIGHT BREAST DISCHARGE    HPI Patient is is a 39 year old female who presents to clinic today with right breast discharge.  This is new for patient but discharge has been present for over 1 year.  Patient reports when discharge started it was a yellow color and gradually changed to dark [place] patient is reporting no pain or changes to skin color around areola.  Pain  She reports new onset bilateral lower back pain. was not an injury that may have caused the pain. The pain started a few months ago and is gradually worsening. The pain does not radiate . The pain is described as aching, is 8/10 in intensity, occurring constantly. Symptoms are worse in the: morning, mid-day, afternoon  Aggravating factors: sitting, standing and walking Relieving factors: none.  She has tried application of heat and NSAIDs with no relief.   ---------------------------------------------------------------------------------------------------    Past Medical History:  Diagnosis Date  . Anxiety   . 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 Cough   . Cyst of brain   . Generalized headaches   . Heart attack ([place]) 04/2019  . Hyperlipidemia   . Hypertension   . Nasal congestion   . PCOS (polycystic ovarian syndrome)   . Rectal pain     Past Surgical History:  Procedure Laterality Date  . ANKLE SURGERY  july 2005   screws placed in right ankle   . CORONARY STENT INTERVENTION N/A 05/08/2019   Procedure: CORONARY STENT INTERVENTION;  Surgeon: Martinique, Peter M, MD;  Location: [facility];  Service: Cardiovascular;  Laterality: N/A;  . FRACTURE SURGERY  2005   right ankle  . HEMORRHOID SURGERY    . HEMORRHOID SURGERY  2015  . INCISION AND DRAINAGE BREAST ABSCESS  november 2011   left breast   . LEFT HEART CATH AND CORONARY ANGIOGRAPHY  N/A 05/08/2019   Procedure: LEFT HEART CATH AND CORONARY ANGIOGRAPHY;  Surgeon: Martinique, Peter M, MD;  Location: [facility];  Service: Cardiovascular;  Laterality: N/A;    Family History  Problem Relation Age of Onset  . Hypertension Mother   . Heart disease Father        Cardiac arrest 2006  . Diabetes Father   . Hyperlipidemia Father   . Stroke Father   . Diabetes Paternal Grandmother   . Cancer Maternal Grandmother        cervical  . Aneurysm Brother     Social History   Socioeconomic History  . Marital status: Married    Spouse name: Not on file  . Number of children: 3  . Years of education: 56  . Highest education level: Associate degree: academic program  Occupational History  . Occupation: Waitress  Tobacco Use  . Smoking status: Former Smoker    Packs/day: 0.10    Years: 26.00    Pack years: 2.60    Types: Cigarettes  . Smokeless tobacco: Never Used  . Tobacco comment: 4 cigs per day   Vaping Use  . Vaping Use: Every day  . Substances: Nicotine  Substance and Sexual Activity  . Alcohol use: No    Comment: quit 2018  . Drug use: No  . Sexual activity: Yes    Birth control/protection: None    Comment: married, 3  kids, vaginal, trying for another  Other Topics Concern  . Not on file  Social History Narrative   Lives at home with her mother, husband and three daughters.   Right-handed.   Approximately 20 cups caffeine per day (4-5 glasses holding 32oz of tea).   Social Determinants of Health   Financial Resource Strain: Not on file  Food Insecurity: Not on file  Transportation Needs: Not on file  Physical Activity: Not on file  Stress: Not on file  Social Connections: Not on file  Intimate Partner Violence: Not on file    Outpatient Medications Prior to Visit  Medication Sig Dispense Refill  . aspirin 81 MG EC tablet Take 1 tablet (81 mg total) by mouth daily. 30 tablet 3  . clotrimazole (MYCELEX) 10 MG troche Take 1 tablet (10 mg total) by  mouth 5 (five) times daily. 140 tablet 1  . fluconazole (DIFLUCAN) 150 MG tablet Take 1 tablet (150 mg total) by mouth daily. 3 tablet 1  . labetalol (NORMODYNE) 100 MG tablet Take 1 tablet (100 mg total) by mouth 2 (two) times daily. 60 tablet 8  . nitroGLYCERIN (NITROSTAT) 0.4 MG SL tablet Place 1 tablet (0.4 mg total) under the tongue every 5 (five) minutes as needed for chest pain. Up to 3 times with each episode 25 tablet 4  . nystatin (MYCOSTATIN) 100000 UNIT/ML suspension Take 5 mLs (500,000 Units total) by mouth 4 (four) times daily. 473 mL 2  . polyethylene glycol powder (GLYCOLAX/MIRALAX) 17 GM/SCOOP powder Take 17 g by mouth 2 (two) times daily as needed. 3350 g 1  . ticagrelor (BRILINTA) 90 MG TABS tablet Take 1 tablet (90 mg total) by mouth 2 (two) times daily. 60 tablet 9  . triamcinolone ointment (KENALOG) 0.5 % Apply 1 application topically 2 (two) times daily. 30 g 0   No facility-administered medications prior to visit.    a 39 year old female who presents to clinic today for right breast discharge.  This is a recurrent symptom for patient.  Patient reports discharge has been present for 1 year. Review of Systems  Musculoskeletal: Positive for back pain. Negative for joint swelling.       Limited ROM  Skin: Negative for color change.       Right breast discharge   All other systems reviewed and are negative.      Objective:    Physical Exam Vitals reviewed. Exam conducted with a chaperone present.  HENT:     Head: Normocephalic.     Nose: Nose normal.  Eyes:     Conjunctiva/sclera: Conjunctivae normal.  Cardiovascular:     Rate and Rhythm: Normal rate and regular rhythm.     Pulses: Normal pulses.     Heart sounds: Normal heart sounds.  Pulmonary:     Effort: Pulmonary effort is normal.     Breath sounds: Normal breath sounds.  Abdominal:     General: Bowel sounds are normal.  Musculoskeletal:        General: Tenderness present.  Skin:    General: Skin is  warm.     Comments: Right breast discharge  Neurological:     Mental Status: She is alert and oriented to person, place, and time.  Psychiatric:        Mood and Affect: Mood normal.        Behavior: Behavior normal.     BP 130/86   Pulse 91   Temp 97.8 F (36.6 C)   Ht 5\' 9"  (1.753  m)   Wt 210 lb 6.4 oz (95.4 kg)   SpO2 97%   BMI 31.07 kg/m  Wt Readings from Last 3 Encounters:  11/04/20 210 lb 6.4 oz (95.4 kg)  08/31/20 206 lb 6.4 oz (93.6 kg)  03/12/20 224 lb (101.6 kg)    Health Maintenance Due  Topic Date Due  . PAP SMEAR-Modifier  06/01/2020    There are no preventive care reminders to display for this patient.   Lab Results  Component Value Date   TSH 0.169 (L) 08/31/2020   Lab Results  Component Value Date   WBC 6.8 08/31/2020   HGB 9.6 (L) 08/31/2020   HCT 34.2 08/31/2020   MCV 58 (L) 08/31/2020   PLT 423 08/31/2020   Lab Results  Component Value Date   NA 138 08/31/2020   K 4.3 08/31/2020   CO2 21 08/31/2020   GLUCOSE 85 08/31/2020   BUN 12 08/31/2020   CREATININE 0.65 08/31/2020   BILITOT 0.3 08/31/2020   ALKPHOS 71 08/31/2020   AST 14 08/31/2020   ALT 13 08/31/2020   PROT 6.8 08/31/2020   ALBUMIN 4.4 08/31/2020   CALCIUM 10.0 08/31/2020   ANIONGAP 7 05/09/2019   Lab Results  Component Value Date   CHOL 206 (H) 06/10/2019   Lab Results  Component Value Date   HDL 22 (L) 06/10/2019   Lab Results  Component Value Date   LDLCALC 106 (H) 06/10/2019   Lab Results  Component Value Date   TRIG 389 (H) 06/10/2019   Lab Results  Component Value Date   CHOLHDL 9.4 (H) 06/10/2019   Lab Results  Component Value Date   HGBA1C 5.4 05/09/2019       Assessment & Plan:   Problem List Items Addressed This Visit      Other   Breast discharge - Primary    Right breast discharge not well controlled.  This is new for patient in the last 1 year.  Completed diagnostic mammogram orders, ultrasound orders, CBC and CMP.  Pending  results  Education provided with printed handouts given.  Follow-up in 2- 4 weeks.  To evaluate results and further treatments as needed.      Relevant Orders   CBC with Differential   Comprehensive metabolic panel   MM Digital Diagnostic Unilat R   US BREAST LTD UNI RIGHT INC AXILLA   Back pain without sciatica    Bilateral lower back  pain without sciatica not well controlled.  Started patient on naproxen 500 mg twice daily with food as needed for pain.  Ice or heat application as tolerated.  Strengthening back exercises.  Depo-Medrol shot given in office, prednisone pack ordered.  Follow-up with unresolved or worsening symptoms in the next 4 weeks.  Rx sent to pharmacy.      Relevant Medications   naproxen (NAPROSYN) 500 MG tablet   predniSONE (STERAPRED UNI-PAK 21 TAB) 10 MG (21) TBPK tablet       Meds ordered this encounter  Medications  . naproxen (NAPROSYN) 500 MG tablet    Sig: Take 1 tablet (500 mg total) by mouth 2 (two) times daily with a meal.    Dispense:  30 tablet    Refill:  0    Order Specific Question:   Supervising Provider    Answer:   Raliegh Ip [[id number]]  . predniSONE (STERAPRED UNI-PAK 21 TAB) 10 MG (21) TBPK tablet    Sig: 6 tablets by mouth day 1, 5 tablet day 2,  4 tablets day 3, 3 tablets day 4, 2 tablets day 5, 1 tablet day 6    Dispense:  1 each    Refill:  0    Order Specific Question:   Supervising Provider    Answer:   Raliegh Ip [[id number]]     Daryll Drown, NP

## 2020-11-04 NOTE — Patient Instructions (Signed)
Acute Back Pain, Adult Acute back pain is sudden and usually short-lived. It is often caused by an injury to the muscles and tissues in the back. The injury may result from:  A muscle or ligament getting overstretched or torn (strained). Ligaments are tissues that connect bones to each other. Lifting something improperly can cause a back strain.  Wear and tear (degeneration) of the spinal disks. Spinal disks are circular tissue that provides cushioning between the bones of the spine (vertebrae).  Twisting motions, such as while playing sports or doing yard work.  A hit to the back.  Arthritis. You may have a physical exam, lab tests, and imaging tests to find the cause of your pain. Acute back pain usually goes away with rest and home care. Follow these instructions at home: Managing pain, stiffness, and swelling  Take over-the-counter and prescription medicines only as told by your health care provider.  Your health care provider may recommend applying ice during the first 24-48 hours after your pain starts. To do this: ? Put ice in a plastic bag. ? Place a towel between your skin and the bag. ? Leave the ice on for 20 minutes, 2-3 times a day.  If directed, apply heat to the affected area as often as told by your health care provider. Use the heat source that your health care provider recommends, such as a moist heat pack or a heating pad. ? Place a towel between your skin and the heat source. ? Leave the heat on for 20-30 minutes. ? Remove the heat if your skin turns bright red. This is especially important if you are unable to feel pain, heat, or cold. You have a greater risk of getting burned. Activity   Do not stay in bed. Staying in bed for more than 1-2 days can delay your recovery.  Sit up and stand up straight. Avoid leaning forward when you sit, or hunching over when you stand. ? If you work at a desk, sit close to it so you do not need to lean over. Keep your chin tucked  in. Keep your neck drawn back, and keep your elbows bent at a right angle. Your arms should look like the letter "L." ? Sit high and close to the steering wheel when you drive. Add lower back (lumbar) support to your car seat, if needed.  Take short walks on even surfaces as soon as you are able. Try to increase the length of time you walk each day.  Do not sit, drive, or stand in one place for more than 30 minutes at a time. Sitting or standing for long periods of time can put stress on your back.  Do not drive or use heavy machinery while taking prescription pain medicine.  Use proper lifting techniques. When you bend and lift, use positions that put less stress on your back: ? Bend your knees. ? Keep the load close to your body. ? Avoid twisting.  Exercise regularly as told by your health care provider. Exercising helps your back heal faster and helps prevent back injuries by keeping muscles strong and flexible.  Work with a physical therapist to make a safe exercise program, as recommended by your health care provider. Do any exercises as told by your physical therapist. Lifestyle  Maintain a healthy weight. Extra weight puts stress on your back and makes it difficult to have good posture.  Avoid activities or situations that make you feel anxious or stressed. Stress and anxiety increase muscle   tension and can make back pain worse. Learn ways to manage anxiety and stress, such as through exercise. General instructions  Sleep on a firm mattress in a comfortable position. Try lying on your side with your knees slightly bent. If you lie on your back, put a pillow under your knees.  Follow your treatment plan as told by your health care provider. This may include: ? Cognitive or behavioral therapy. ? Acupuncture or massage therapy. ? Meditation or yoga. Contact a health care provider if:  You have pain that is not relieved with rest or medicine.  You have increasing pain going down  into your legs or buttocks.  Your pain does not improve after 2 weeks.  You have pain at night.  You lose weight without trying.  You have a fever or chills. Get help right away if:  You develop new bowel or bladder control problems.  You have unusual weakness or numbness in your arms or legs.  You develop nausea or vomiting.  You develop abdominal pain.  You feel faint. Summary  Acute back pain is sudden and usually short-lived.  Use proper lifting techniques. When you bend and lift, use positions that put less stress on your back.  Take over-the-counter and prescription medicines and apply heat or ice as directed by your health care provider. This information is not intended to replace advice given to you by your health care provider. Make sure you discuss any questions you have with your health care provider. Document Revised: 02/12/2019 Document Reviewed: 06/07/2017 Elsevier Patient Education  [place]. Galactorrhea Galactorrhea is the flow of a milky fluid (discharge) from the breast. It is different from normal milk in nursing mothers. The fluid can be white, yellow, or green. This condition can be caused by many things. Most cases are not serious and do not require treatment. Watch your condition to make sure it goes away. Follow these instructions at home: Breast care   Watch your condition for any changes.  Do not squeeze your breasts or nipples.  Avoid touching your breasts when you are having sex.  Perform a breast self-exam once a month.  Avoid clothes that rub on your nipples.  Use breast pads to absorb the fluid.  Wear a support bra or a breast binder. General instructions  Take over-the-counter and prescription medicines only as told by your doctor.  Keep all follow-up visits as told by your doctor. This is important. Contact a doctor if you:  Have hot flashes.  Have vaginal dryness.  Have no desire for sex.  Stop having periods,  or have periods that are irregular or far apart.  Have headaches.  Cannot see well. Get help right away if:  Your breast discharge is bloody or yellowish white (puslike).  You have breast pain.  You feel a lump in your breast.  Your breast shows wrinkling or dimpling.  Your breast becomes red and swollen. Summary  Galactorrhea is the flow of a milky fluid (discharge) from the breast.  This condition may be caused by many things, but it is not usually serious.  Watch your condition carefully to make sure that it goes away.  Get help right away if the fluid is bloody or yellowish white, or if you have a lump, pain, or skin changes on your breast. This information is not intended to replace advice given to you by your health care provider. Make sure you discuss any questions you have with your health care provider. Document Revised:  11/06/2017 Document Reviewed: 11/06/2017 Elsevier Patient Education  2020 [employer].

## 2020-11-04 NOTE — Assessment & Plan Note (Signed)
Bilateral lower back  pain without sciatica not well controlled.  Started patient on naproxen 500 mg twice daily with food as needed for pain.  Ice or heat application as tolerated.  Strengthening back exercises.  Depo-Medrol shot given in office, prednisone pack ordered.  Follow-up with unresolved or worsening symptoms in the next 4 weeks.  Rx sent to pharmacy.

## 2020-11-05 LAB — COMPREHENSIVE METABOLIC PANEL
ALT: 14 IU/L (ref 0–32)
AST: 16 IU/L (ref 0–40)
Albumin/Globulin Ratio: 1.7 (ref 1.2–2.2)
Albumin: 4.3 g/dL (ref 3.8–4.8)
Alkaline Phosphatase: 61 IU/L (ref 44–121)
BUN/Creatinine Ratio: 14 (ref 9–23)
BUN: 9 mg/dL (ref 6–20)
Bilirubin Total: 0.3 mg/dL (ref 0.0–1.2)
CO2: 22 mmol/L (ref 20–29)
Calcium: 9.4 mg/dL (ref 8.7–10.2)
Chloride: 106 mmol/L (ref 96–106)
Creatinine, Ser: 0.64 mg/dL (ref 0.57–1.00)
GFR calc Af Amer: 131 mL/min/{1.73_m2} (ref >59)
GFR calc non Af Amer: 114 mL/min/{1.73_m2} (ref >59)
Globulin, Total: 2.5 g/dL (ref 1.5–4.5)
Glucose: 79 mg/dL (ref 65–99)
Potassium: 4.4 mmol/L (ref 3.5–5.2)
Sodium: 139 mmol/L (ref 134–144)
Total Protein: 6.8 g/dL (ref 6.0–8.5)

## 2020-11-05 LAB — CBC WITH DIFFERENTIAL/PLATELET
Basophils Absolute: 0.1 10*3/uL (ref 0.0–0.2)
Basos: 1 %
EOS (ABSOLUTE): 0.1 10*3/uL (ref 0.0–0.4)
Eos: 1 %
Hematocrit: 35.7 % (ref 34.0–46.6)
Hemoglobin: 10.3 g/dL — ABNORMAL LOW (ref 11.1–15.9)
Immature Grans (Abs): 0 10*3/uL (ref 0.0–0.1)
Immature Granulocytes: 0 %
Lymphocytes Absolute: 2.5 10*3/uL (ref 0.7–3.1)
Lymphs: 27 %
MCH: 17.2 pg — ABNORMAL LOW (ref 26.6–33.0)
MCHC: 28.9 g/dL — ABNORMAL LOW (ref 31.5–35.7)
MCV: 60 fL — ABNORMAL LOW (ref 79–97)
Monocytes Absolute: 1 10*3/uL — ABNORMAL HIGH (ref 0.1–0.9)
Monocytes: 11 %
Neutrophils Absolute: 5.4 10*3/uL (ref 1.4–7.0)
Neutrophils: 60 %
Platelets: 441 10*3/uL (ref 150–450)
RBC: 5.98 x10E6/uL — ABNORMAL HIGH (ref 3.77–5.28)
RDW: 21.3 % — ABNORMAL HIGH (ref 11.7–15.4)
WBC: 9 10*3/uL (ref 3.4–10.8)

## 2020-11-05 MED ORDER — METHYLPREDNISOLONE ACETATE 40 MG/ML IJ SUSP
80.0000 mg | Freq: Once | INTRAMUSCULAR | Status: AC
  Administered 2020-11-04: 80 mg via INTRAMUSCULAR

## 2020-11-05 MED ORDER — METHYLPREDNISOLONE ACETATE 80 MG/ML IJ SUSP: 80.0000 mg | Freq: Once | INTRAMUSCULAR | Status: DC

## 2020-11-05 NOTE — Addendum Note (Signed)
Addended by: Diamantina Monks on: 11/05/2020 08:45 AM   Modules accepted: Orders

## 2020-11-17 ENCOUNTER — Ambulatory Visit: Payer: Medicaid Other | Admitting: Internal Medicine

## 2020-12-07 ENCOUNTER — Telehealth: Payer: Medicaid Other | Admitting: Internal Medicine

## 2020-12-07 ENCOUNTER — Telehealth (INDEPENDENT_AMBULATORY_CARE_PROVIDER_SITE_OTHER): Payer: Medicaid Other | Admitting: Nurse Practitioner

## 2020-12-07 ENCOUNTER — Encounter: Payer: Self-pay | Admitting: Nurse Practitioner

## 2020-12-07 DIAGNOSIS — J029 Acute pharyngitis, unspecified: Secondary | ICD-10-CM | POA: Insufficient documentation

## 2020-12-07 LAB — CULTURE, GROUP A STREP

## 2020-12-07 LAB — RAPID STREP SCREEN (MED CTR MEBANE ONLY): Strep Gp A Ag, IA W/Reflex: NEGATIVE

## 2020-12-07 MED ORDER — AMOXICILLIN-POT CLAVULANATE 875-125 MG PO TABS: 1.0000 | ORAL_TABLET | Freq: Two times a day (BID) | ORAL | 0 refills | Status: DC

## 2020-12-07 NOTE — Assessment & Plan Note (Signed)
Patient is reporting worsening sore throat since last diagnosed with COVID-19.  Patient is reporting increased white patch in her throat and has not responded well to antibiotics Strep throat swab completed results pending.  Started patient on amoxicillin, saltwater gargle, plan is to assess patient for thrush at an office visit if symptoms are not resolved or worsening.  Rx sent to pharmacy.  Follow-up with worsening or unresolved symptoms

## 2020-12-07 NOTE — Progress Notes (Signed)
   Virtual Visit via telephone Note Due to COVID-19 pandemic this visit was conducted virtually. This visit type was conducted due to national recommendations for restrictions regarding the COVID-19 Pandemic (e.g. social distancing, sheltering in place) in an effort to limit this patient's exposure and mitigate transmission in our community. All issues noted in this document were discussed and addressed.  A physical exam was not performed with this format.  I connected with Kaitlin Torres on 12/07/20 at 2:22 PM by telephone and verified that I am speaking with the correct person using two identifiers. Kaitlin Torres is currently locate at home during visit. The provider, Ivy Lynn, NP is located in their office at time of visit.  I discussed the limitations, risks, security and privacy concerns of performing an evaluation and management service by telephone and the availability of in person appointments. I also discussed with the patient that there may be a patient responsible charge related to this service. The patient expressed understanding and agreed to proceed.   History and Present Illness:  Sore Throat  This is a recurrent problem. The current episode started in the past 7 days. The problem has been unchanged. Neither side of throat is experiencing more pain than the other. There has been no fever. The pain is moderate. Associated symptoms include coughing. Pertinent negatives include no congestion, drooling, ear pain, headaches, hoarse voice, shortness of breath or swollen glands. Associated symptoms comments: White patchy throat. She has had no exposure to strep. The treatment provided no relief.      Review of Systems  Constitutional: Negative for chills, fever and malaise/fatigue.  HENT: Negative for congestion, drooling, ear pain, hoarse voice and sinus pain.   Respiratory: Positive for cough. Negative for shortness of breath.   Skin: Negative for rash.  Neurological:  Negative for headaches.  All other systems reviewed and are negative.    Observations/Objective: Televisit.  Patient does not sound to be in any distress  Assessment and Plan: Sore throat Patient is reporting worsening sore throat since last diagnosed with COVID-19.  Patient is reporting increased white patch in her throat and has not responded well to antibiotics Strep throat swab completed results pending.  Started patient on amoxicillin, saltwater gargle, plan is to assess patient for thrush at an office visit if symptoms are not resolved or worsening.  Rx sent to pharmacy.  Follow-up with worsening or unresolved symptoms   Follow Up Instructions: Follow up with worsening unresolved symptoms.    I discussed the assessment and treatment plan with the patient. The patient was provided an opportunity to ask questions and all were answered. The patient agreed with the plan and demonstrated an understanding of the instructions.   The patient was advised to call back or seek an in-person evaluation if the symptoms worsen or if the condition fails to improve as anticipated.  The above assessment and management plan was discussed with the patient. The patient verbalized understanding of and has agreed to the management plan. Patient is aware to call the clinic if symptoms persist or worsen. Patient is aware when to return to the clinic for a follow-up visit. Patient educated on when it is appropriate to go to the emergency department.   Time call ended: 2:32 PM  I provided 10 minutes of non-face-to-face time during this encounter.    Ivy Lynn, NP

## 2020-12-07 NOTE — Patient Instructions (Signed)

## 2020-12-14 ENCOUNTER — Ambulatory Visit: Payer: Medicaid Other | Admitting: Internal Medicine

## 2020-12-18 ENCOUNTER — Ambulatory Visit
Admission: RE | Admit: 2020-12-18 | Discharge: 2020-12-18 | Disposition: A | Discharge status: Home / Self Care | Payer: Medicaid Other | Source: Ambulatory Visit | Attending: Nurse Practitioner | Admitting: Nurse Practitioner

## 2020-12-18 ENCOUNTER — Other Ambulatory Visit: Payer: Self-pay

## 2020-12-18 ENCOUNTER — Other Ambulatory Visit: Payer: Self-pay | Admitting: Family

## 2020-12-18 DIAGNOSIS — N6041 Mammary duct ectasia of right breast: Secondary | ICD-10-CM | POA: Diagnosis not present

## 2020-12-18 DIAGNOSIS — R922 Inconclusive mammogram: Secondary | ICD-10-CM | POA: Diagnosis not present

## 2020-12-18 DIAGNOSIS — N6452 Nipple discharge: Secondary | ICD-10-CM | POA: Diagnosis not present

## 2020-12-18 DIAGNOSIS — B370 Candidal stomatitis: Secondary | ICD-10-CM

## 2020-12-22 ENCOUNTER — Encounter: Payer: Self-pay | Admitting: Family Medicine

## 2020-12-22 ENCOUNTER — Ambulatory Visit (INDEPENDENT_AMBULATORY_CARE_PROVIDER_SITE_OTHER): Payer: Medicaid Other | Admitting: Family Medicine

## 2020-12-22 DIAGNOSIS — N6452 Nipple discharge: Secondary | ICD-10-CM

## 2020-12-22 NOTE — Progress Notes (Signed)
Virtual Visit via Telephone Note  I connected with Kaitlin Torres on 12/22/20 at 4:32 PM by telephone and verified that I am speaking with the correct person using two identifiers. Kaitlin Torres is currently located at home and her daughter is currently with her during this visit. The provider, Loman Brooklyn, FNP is located in their office at time of visit.  I discussed the limitations, risks, security and privacy concerns of performing an evaluation and management service by telephone and the availability of in person appointments. I also discussed with the patient that there may be a patient responsible charge related to this service. The patient expressed understanding and agreed to proceed.  Subjective: PCP: Loman Brooklyn, FNP  Chief Complaint  Patient presents with  . Breast Problem   Patient was seen on 11/04/2020 due to breast discharge at which time a mammogram and ultrasound was ordered.  This was completed on 12/18/2020 which did not show an identifiable cause for the nipple discharge.  It was recommended patient have a breast MRI and surgical consultation.  Patient is unable to have an MRI due to a dermal piercing of her chest.  She is agreeable to the referral for surgical consultation.   ROS: Per HPI  Current Outpatient Medications:  .  amoxicillin-clavulanate (AUGMENTIN) 875-125 MG tablet, Take 1 tablet by mouth 2 (two) times daily., Disp: 14 tablet, Rfl: 0 .  naproxen (NAPROSYN) 500 MG tablet, Take 1 tablet (500 mg total) by mouth 2 (two) times daily with a meal., Disp: 30 tablet, Rfl: 0 .  predniSONE (STERAPRED UNI-PAK 21 TAB) 10 MG (21) TBPK tablet, 6 tablets by mouth day 1, 5 tablet day 2, 4 tablets day 3, 3 tablets day 4, 2 tablets day 5, 1 tablet day 6, Disp: 1 each, Rfl: 0  No Known Allergies Past Medical History:  Diagnosis Date  . Anxiety   . 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 Cough   . Cyst of brain   . Generalized headaches   . Heart attack ([place]) 04/2019  .  Hyperlipidemia   . Hypertension   . Nasal congestion   . PCOS (polycystic ovarian syndrome)   . Rectal pain     Observations/Objective: A&O  No respiratory distress or wheezing audible over the phone Mood, judgement, and thought processes all WNL   Assessment and Plan: 1. Breast discharge Referring to a breast surgeon for consultation and further imaging as they deem necessary as I am unable to order a MRI due to a dermal piercing of the chest. - Ambulatory referral to General Surgery   Follow Up Instructions:  I discussed the assessment and treatment plan with the patient. The patient was provided an opportunity to ask questions and all were answered. The patient agreed with the plan and demonstrated an understanding of the instructions.   The patient was advised to call back or seek an in-person evaluation if the symptoms worsen or if the condition fails to improve as anticipated.  The above assessment and management plan was discussed with the patient. The patient verbalized understanding of and has agreed to the management plan. Patient is aware to call the clinic if symptoms persist or worsen. Patient is aware when to return to the clinic for a follow-up visit. Patient educated on when it is appropriate to go to the emergency department.   Time call ended: 4:45 PM  I provided 13 minutes of non-face-to-face time during this encounter.  Hendricks Limes, MSN, APRN, FNP-C Tristan Schroeder  [place] 12/22/20

## 2020-12-24 ENCOUNTER — Other Ambulatory Visit: Payer: Self-pay | Admitting: Family

## 2020-12-24 DIAGNOSIS — B370 Candidal stomatitis: Secondary | ICD-10-CM

## 2021-01-13 ENCOUNTER — Ambulatory Visit (INDEPENDENT_AMBULATORY_CARE_PROVIDER_SITE_OTHER): Payer: Medicaid Other | Admitting: Family Medicine

## 2021-01-13 ENCOUNTER — Encounter: Payer: Self-pay | Admitting: Family Medicine

## 2021-01-13 ENCOUNTER — Other Ambulatory Visit: Payer: Self-pay

## 2021-01-13 VITALS — BP 128/81 | HR 64 | Temp 97.1°F | Ht 69.0 in | Wt 200.0 lb

## 2021-01-13 DIAGNOSIS — K148 Other diseases of tongue: Secondary | ICD-10-CM | POA: Diagnosis not present

## 2021-01-13 DIAGNOSIS — B370 Candidal stomatitis: Secondary | ICD-10-CM

## 2021-01-13 DIAGNOSIS — E559 Vitamin D deficiency, unspecified: Secondary | ICD-10-CM

## 2021-01-13 DIAGNOSIS — R7989 Other specified abnormal findings of blood chemistry: Secondary | ICD-10-CM | POA: Diagnosis not present

## 2021-01-13 LAB — BAYER DCA HB A1C WAIVED: HB A1C (BAYER DCA - WAIVED): 5.4 % (ref <7.0)

## 2021-01-13 MED ORDER — FLUCONAZOLE 200 MG PO TABS: 200.0000 mg | ORAL_TABLET | Freq: Every day | ORAL | 0 refills | Status: AC

## 2021-01-13 NOTE — Patient Instructions (Signed)
Oral Thrush, Adult Oral thrush is an infection in your mouth and throat and on your tongue. It causes white patches to form in your mouth and on your tongue. Many cases of thrush are mild. But, sometimes, thrush can be serious. People who have a weak body defense system (immune system) or other diseases can be affected more. What are the causes? This condition is caused by a type of fungus called yeast. The fungus is normally present in small amounts in the mouth and nose. If a person has a long-term illness or a weak body defense system, the fungus can grow and spread quickly. This causes thrush. What increases the risk? You are more likely to develop this condition if:  You have a weak body defense system.  You are an older adult.  You have diabetes, cancer, or HIV.  You have a dry mouth.  You are pregnant or breastfeeding.  You do not take good care of your teeth. This risk is greater for people who have false teeth (dentures).  You use antibiotic or steroid medicines. What are the signs or symptoms? Symptoms of this condition include:  A burning feeling in the mouth and throat.  White patches that stick to the mouth and tongue.  A bad taste in the mouth or trouble tasting foods.  A feeling like you have cotton in your mouth.  Pain when you eat and swallow.  Not wanting to eat as much as usual.  Cracking at the corners of the mouth.   How is this treated? This condition is treated with medicines called antifungals. These medicines prevent a fungus from growing. The medicines are either put right on the area (topical) or swallowed (oral). Your doctor will also treat other problems that you may have, such as diabetes or HIV. Follow these instructions at home: Medicines  Take or use over-the-counter and prescription medicines only as told by your doctor.  Ask your doctor about an over-the-counter medicine called gentian violet. Helping with pain and soreness To lessen  your pain:  Drink cold liquids, like water and iced tea.  Eat frozen ice pops or frozen juices.  Eat foods that are easy to swallow, like gelatin and ice cream.  Drink from a straw if you have too much pain in your mouth.   General instructions  Eat plain yogurt that has live cultures in it. Read the label to make sure that there are live cultures in your yogurt.  If you wear false teeth: ? Take them out before you go to bed. ? Brush them well. ? Soak them in a cleaner.  Rinse your mouth with warm salt-water many times a day. To make the salt-water mixture, dissolve -1 teaspoon (3-6 g) of salt in 1 cup (237 mL) of warm water. Contact a doctor if:  Your problems do not get better within 7 days of treatment.  Your infection is spreading. This may show as white areas on the skin outside of your mouth.  You are nursing your baby and you have redness and pain in the nipples. Summary  Oral thrush is an infection in your mouth and throat. It is caused by a fungus.  You are more likely to get this condition if you have a weak body defense system. Diseases like diabetes, cancer, or HIV also add to your risk.  This condition is treated with medicines called antifungals.  Contact a doctor if you do not get better within 7 days of starting treatment. This information  is not intended to replace advice given to you by your health care provider. Make sure you discuss any questions you have with your health care provider. Document Revised: 08/30/2019 Document Reviewed: 08/30/2019 Elsevier Patient Education  2021 [employer].   Hyperthyroidism  Hyperthyroidism is when the thyroid gland is too active (overactive). The thyroid gland is a small gland located in the lower front part of the neck, just in front of the windpipe (trachea). This gland makes hormones that help control how the body uses food for energy (metabolism) as well as how the heart and brain function. These hormones also  play a role in keeping your bones strong. When the thyroid is overactive, it produces too much of a hormone called thyroxine. What are the causes? This condition may be caused by:  Graves' disease. This is a disorder in which the body's disease-fighting system (immune system) attacks the thyroid gland. This is the most common cause.  Inflammation of the thyroid gland.  A tumor in the thyroid gland.  Use of certain medicines, including: ? Prescription thyroid hormone replacement. ? Herbal supplements that mimic thyroid hormones. ? Amiodarone therapy.  Solid or fluid-filled lumps within your thyroid gland (thyroid nodules).  Taking in a large amount of iodine from foods or medicines. What increases the risk? You are more likely to develop this condition if:  You are female.  You have a family history of thyroid conditions.  You smoke tobacco.  You use a medicine called lithium.  You take medicines that affect the immune system (immunosuppressants). What are the signs or symptoms? Symptoms of this condition include:  Nervousness.  Inability to tolerate heat.  Unexplained weight loss.  Diarrhea.  Change in the texture of hair or skin.  Heart skipping beats or making extra beats.  Rapid heart rate.  Loss of menstruation.  Shaky hands.  Fatigue.  Restlessness.  Sleep problems.  Enlarged thyroid gland or a lump in the thyroid (nodule). You may also have symptoms of Graves' disease, which may include:  Protruding eyes.  Dry eyes.  Red or swollen eyes.  Problems with vision. How is this diagnosed? This condition may be diagnosed based on:  Your symptoms and medical history.  A physical exam.  Blood tests.  Thyroid ultrasound. This test involves using sound waves to produce images of the thyroid gland.  A thyroid scan. A radioactive substance is injected into a vein, and images show how much iodine is present in the thyroid.  Radioactive iodine  uptake test (RAIU). A small amount of radioactive iodine is given by mouth to see how much iodine the thyroid absorbs after a certain amount of time. How is this treated? Treatment depends on the cause and severity of the condition. Treatment may include:  Medicines to reduce the amount of thyroid hormone your body makes.  Radioactive iodine treatment (radioiodine therapy). This involves swallowing a small dose of radioactive iodine, in capsule or liquid form, to kill thyroid cells.  Surgery to remove part or all of your thyroid gland. You may need to take thyroid hormone replacement medicine for the rest of your life after thyroid surgery.  Medicines to help manage your symptoms. Follow these instructions at home:  Take over-the-counter and prescription medicines only as told by your health care provider.  Do not use any products that contain nicotine or tobacco, such as cigarettes and e-cigarettes. If you need help quitting, ask your health care provider.  Follow any instructions from your health care provider about  diet. You may be instructed to limit foods that contain iodine.  Keep all follow-up visits as told by your health care provider. This is important. ? You will need to have blood tests regularly so that your health care provider can monitor your condition.   Contact a health care provider if:  Your symptoms do not get better with treatment.  You have a fever.  You are taking thyroid hormone replacement medicine and you: ? Have symptoms of depression. ? Feel like you are tired all the time. ? Gain weight. Get help right away if:  You have chest pain.  You have decreased alertness or a change in your awareness.  You have abdominal pain.  You feel dizzy.  You have a rapid heartbeat.  You have an irregular heartbeat.  You have difficulty breathing. Summary  The thyroid gland is a small gland located in the lower front part of the neck, just in front of the  windpipe (trachea).  Hyperthyroidism is when the thyroid gland is too active (overactive) and produces too much of a hormone called thyroxine.  The most common cause is Graves' disease, a disorder in which your immune system attacks the thyroid gland.  Hyperthyroidism can cause various symptoms, such as unexplained weight loss, nervousness, inability to tolerate heat, or changes in your heartbeat.  Treatment may include medicine to reduce the amount of thyroid hormone your body makes, radioiodine therapy, surgery, or medicines to manage symptoms. This information is not intended to replace advice given to you by your health care provider. Make sure you discuss any questions you have with your health care provider. Document Revised: 07/09/2020 Document Reviewed: 07/09/2020 Elsevier Patient Education  2021 [employer].

## 2021-01-13 NOTE — Progress Notes (Signed)
Assessment & Plan:  1. Oral candidiasis Education provided on thrush in adults. - fluconazole (DIFLUCAN) 200 MG tablet; Take 1 tablet (200 mg total) by mouth daily for 14 days.  Dispense: 14 tablet; Refill: 0 - Bayer DCA Hb A1c Waived - CBC with Differential/Platelet - CMP14+EGFR  2. Tongue lesion - Ambulatory referral to ENT  3. Abnormal TSH - Thyroid Panel With TSH  4. Vitamin D deficiency - VITAMIN D 25 Hydroxy (Vit-D Deficiency, Fractures)   Follow up plan: Return in about 4 weeks (around 02/10/2021) for follow-up of chronic medication conditions (get established with me).  Hendricks Limes, MSN, APRN, FNP-C [facility]  Subjective:   Patient ID: Kaitlin Torres, female    DOB: [birth year], 40 y.o.   MRN: [id number]  HPI: Kaitlin Torres is a 40 y.o. female presenting on 01/13/2021 for [name] (Patient states it has been on and off x 1 year since she had covid. Patient states that her mouth burns when she eats. )  Patient reports that she has had thrush on and off for the past year since she had COVID-19.  She also reports her mouth burns when she eats.  She gave the example of eating cheese dip at the [employer] recently that really burned her mouth.  Additionally she has a bump on the left side of her tongue that has been present x6 months.  It is painful.   ROS: Negative unless specifically indicated above in HPI.   Relevant past medical history reviewed and updated as indicated.   Allergies and medications reviewed and updated.  No current outpatient medications on file.  No Known Allergies  Objective:   BP 128/81   Pulse 64   Temp (!) 97.1 F (36.2 C) (Temporal)   Ht '5\' 9"'  (1.753 m)   Wt 200 lb (90.7 kg)   SpO2 100%   BMI 29.53 kg/m    Physical Exam Vitals reviewed.  Constitutional:      General: She is not in acute distress.    Appearance: Normal appearance. She is not ill-appearing, toxic-appearing or diaphoretic.   HENT:     Head: Normocephalic and atraumatic.     Mouth/Throat:     Mouth: Mucous membranes are moist.     Tongue: Lesions (left side) present.     Comments: Tongue coated white - consistent with fungal infection.  Eyes:     General: No scleral icterus.       Right eye: No discharge.        Left eye: No discharge.     Conjunctiva/sclera: Conjunctivae normal.  Cardiovascular:     Rate and Rhythm: Normal rate.  Pulmonary:     Effort: Pulmonary effort is normal. No respiratory distress.  Musculoskeletal:        General: Normal range of motion.     Cervical back: Normal range of motion.  Skin:    General: Skin is warm and dry.     Capillary Refill: Capillary refill takes less than 2 seconds.  Neurological:     General: No focal deficit present.     Mental Status: She is alert and oriented to person, place, and time. Mental status is at baseline.  Psychiatric:        Mood and Affect: Mood normal.        Behavior: Behavior normal.        Thought Content: Thought content normal.        Judgment: Judgment normal.

## 2021-01-14 LAB — CBC WITH DIFFERENTIAL/PLATELET
Basophils Absolute: 0.1 10*3/uL (ref 0.0–0.2)
Basos: 1 %
EOS (ABSOLUTE): 0 10*3/uL (ref 0.0–0.4)
Eos: 1 %
Hematocrit: 37.6 % (ref 34.0–46.6)
Hemoglobin: 11 g/dL — ABNORMAL LOW (ref 11.1–15.9)
Immature Grans (Abs): 0 10*3/uL (ref 0.0–0.1)
Immature Granulocytes: 0 %
Lymphocytes Absolute: 2 10*3/uL (ref 0.7–3.1)
Lymphs: 28 %
MCH: 18 pg — ABNORMAL LOW (ref 26.6–33.0)
MCHC: 29.3 g/dL — ABNORMAL LOW (ref 31.5–35.7)
MCV: 61 fL — ABNORMAL LOW (ref 79–97)
Monocytes Absolute: 0.6 10*3/uL (ref 0.1–0.9)
Monocytes: 8 %
Neutrophils Absolute: 4.4 10*3/uL (ref 1.4–7.0)
Neutrophils: 62 %
Platelets: 464 10*3/uL — ABNORMAL HIGH (ref 150–450)
RBC: 6.12 x10E6/uL — ABNORMAL HIGH (ref 3.77–5.28)
RDW: 21.6 % — ABNORMAL HIGH (ref 11.7–15.4)
WBC: 7 10*3/uL (ref 3.4–10.8)

## 2021-01-14 LAB — CMP14+EGFR
ALT: 14 IU/L (ref 0–32)
AST: 17 IU/L (ref 0–40)
Albumin/Globulin Ratio: 1.6 (ref 1.2–2.2)
Albumin: 4.5 g/dL (ref 3.8–4.8)
Alkaline Phosphatase: 66 IU/L (ref 44–121)
BUN/Creatinine Ratio: 12 (ref 9–23)
BUN: 9 mg/dL (ref 6–20)
Bilirubin Total: 0.4 mg/dL (ref 0.0–1.2)
CO2: 18 mmol/L — ABNORMAL LOW (ref 20–29)
Calcium: 9.7 mg/dL (ref 8.7–10.2)
Chloride: 106 mmol/L (ref 96–106)
Creatinine, Ser: 0.73 mg/dL (ref 0.57–1.00)
Globulin, Total: 2.8 g/dL (ref 1.5–4.5)
Glucose: 88 mg/dL (ref 65–99)
Potassium: 4.6 mmol/L (ref 3.5–5.2)
Sodium: 137 mmol/L (ref 134–144)
Total Protein: 7.3 g/dL (ref 6.0–8.5)
eGFR: 108 mL/min/{1.73_m2} (ref >59)

## 2021-01-14 LAB — THYROID PANEL WITH TSH
Free Thyroxine Index: 2 (ref 1.2–4.9)
T3 Uptake Ratio: 26 % (ref 24–39)
T4, Total: 7.5 ug/dL (ref 4.5–12.0)
TSH: 0.169 u[IU]/mL — ABNORMAL LOW (ref 0.450–4.500)

## 2021-01-14 LAB — VITAMIN D 25 HYDROXY (VIT D DEFICIENCY, FRACTURES): Vit D, 25-Hydroxy: 17.1 ng/mL — ABNORMAL LOW (ref 30.0–100.0)

## 2021-01-15 ENCOUNTER — Other Ambulatory Visit: Payer: Self-pay | Admitting: Family Medicine

## 2021-01-15 ENCOUNTER — Encounter: Payer: Self-pay | Admitting: Family Medicine

## 2021-01-15 DIAGNOSIS — E559 Vitamin D deficiency, unspecified: Secondary | ICD-10-CM

## 2021-01-15 HISTORY — DX: Vitamin D deficiency, unspecified: E55.9

## 2021-01-15 MED ORDER — VITAMIN D (ERGOCALCIFEROL) 1.25 MG (50000 UNIT) PO CAPS: 50000.0000 [IU] | ORAL_CAPSULE | ORAL | 1 refills | Status: DC

## 2021-01-20 ENCOUNTER — Encounter: Payer: Self-pay | Admitting: Family Medicine

## 2021-01-20 ENCOUNTER — Other Ambulatory Visit: Payer: Self-pay | Admitting: Family Medicine

## 2021-01-20 DIAGNOSIS — N6452 Nipple discharge: Secondary | ICD-10-CM

## 2021-01-20 DIAGNOSIS — R7989 Other specified abnormal findings of blood chemistry: Secondary | ICD-10-CM

## 2021-02-01 ENCOUNTER — Ambulatory Visit: Payer: Medicaid Other | Admitting: Nurse Practitioner

## 2021-02-01 NOTE — Patient Instructions (Incomplete)

## 2021-02-09 ENCOUNTER — Ambulatory Visit: Payer: Medicaid Other | Admitting: General Surgery

## 2021-02-09 ENCOUNTER — Other Ambulatory Visit: Payer: Self-pay

## 2021-02-09 ENCOUNTER — Ambulatory Visit: Payer: Medicaid Other | Admitting: Family Medicine

## 2021-02-09 ENCOUNTER — Encounter: Payer: Self-pay | Admitting: Family Medicine

## 2021-02-09 VITALS — BP 115/77 | HR 83 | Temp 98.1°F | Ht 69.0 in | Wt 201.0 lb

## 2021-02-09 DIAGNOSIS — E782 Mixed hyperlipidemia: Secondary | ICD-10-CM | POA: Diagnosis not present

## 2021-02-09 DIAGNOSIS — N6452 Nipple discharge: Secondary | ICD-10-CM | POA: Diagnosis not present

## 2021-02-09 DIAGNOSIS — E559 Vitamin D deficiency, unspecified: Secondary | ICD-10-CM | POA: Diagnosis not present

## 2021-02-09 DIAGNOSIS — I252 Old myocardial infarction: Secondary | ICD-10-CM | POA: Diagnosis not present

## 2021-02-09 DIAGNOSIS — K148 Other diseases of tongue: Secondary | ICD-10-CM | POA: Insufficient documentation

## 2021-02-09 DIAGNOSIS — R7989 Other specified abnormal findings of blood chemistry: Secondary | ICD-10-CM

## 2021-02-09 NOTE — Progress Notes (Signed)
Assessment & Plan:  1. History of non-ST elevation myocardial infarction (NSTEMI) Encouraged to follow-up with cardiology now that she is no longer planning pregnancy. Discussed importance of statin and beta-blocker after having a heart attack. Patient would like to have lipid panel completed before agreeing to resume atorvastatin.  2. Mixed hyperlipidemia Labs to assess. - Lipid panel  3. Vitamin D deficiency On vitamin D supplement.  4. Breast discharge Keep appointment with general surgery later this month.  5. Tongue lesion Keep appointment with ENT next month.  6. Abnormal TSH Reschedule appointment with endocrinology.    Return in about 6 months (around 08/11/2021) for follow-up of chronic medication conditions.  Hendricks Limes, MSN, APRN, FNP-C [facility]  Subjective:    Patient ID: Kaitlin Torres, female    DOB: 1981/05/16, 40 y.o.   MRN: [id number]  Patient Care Team: Loman Brooklyn, FNP as PCP - General (Family Medicine) Elouise Munroe, MD as PCP - Cardiology (Cardiology)   Chief Complaint:  Chief Complaint  Patient presents with  . Establish Care    Check up of chronic medical conditions     HPI: Kaitlin Torres is a 40 y.o. female presenting on 02/09/2021 for Establish Care (Check up of chronic medical conditions )  Patient has a h/o NSTEMI for which she sees cardiologist, Dr. Margaretann Loveless. She previously discontinued Brilinta on 05/07/2020 after one year of DAPT. She is to remain on ASA 81 mg daily (which she has not been taking). Atorvastatin and Losartan were held while patient planned pregnancy (which she is no longer planning). She was to continue labetalol (which she has not been taking). Patient does not wish to resume these medications.   Patient was previously referred to ENT due to a lesion on her tongue which is scheduled for 03/08/2021.  Patient is scheduled with Dr. Constance Haw (general surgery) on 02/25/2021 due to nipple  discharge without identifiable cause on mammogram and ultrasound.   She was also previously referred to endocrinology which she needs to reschedule.   New complaints: None  Social history:  Relevant past medical, surgical, family and social history reviewed and updated as indicated. Interim medical history since our last visit reviewed.  Allergies and medications reviewed and updated.  DATA REVIEWED: CHART IN EPIC  ROS: Negative unless specifically indicated above in HPI.    Current Outpatient Medications:  Marland Kitchen  Vitamin D, Ergocalciferol, (DRISDOL) 1.25 MG (50000 UNIT) CAPS capsule, Take 1 capsule (50,000 Units total) by mouth every 7 (seven) days., Disp: 12 capsule, Rfl: 1   No Known Allergies Past Medical History:  Diagnosis Date  . Anxiety   . 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 Cough   . Generalized headaches   . Heart attack ([place]) 04/2019  . Hyperlipidemia   . Hypertension   . PCOS (polycystic ovarian syndrome)   . Rectal pain   . Vitamin D deficiency 01/15/2021    Past Surgical History:  Procedure Laterality Date  . ANKLE SURGERY  july 2005   screws placed in right ankle   . CORONARY STENT INTERVENTION N/A 05/08/2019   Procedure: CORONARY STENT INTERVENTION;  Surgeon: Martinique, Peter M, MD;  Location: [facility];  Service: Cardiovascular;  Laterality: N/A;  . FRACTURE SURGERY  2005   right ankle  . HEMORRHOID SURGERY    . HEMORRHOID SURGERY  2015  . INCISION AND DRAINAGE BREAST ABSCESS  november 2011   left breast   . LEFT HEART CATH AND CORONARY ANGIOGRAPHY  N/A 05/08/2019   Procedure: LEFT HEART CATH AND CORONARY ANGIOGRAPHY;  Surgeon: Martinique, Peter M, MD;  Location: [facility];  Service: Cardiovascular;  Laterality: N/A;    Social History   Socioeconomic History  . Marital status: Married    Spouse name: Not on file  . Number of children: 3  . Years of education: 47  . Highest education level: Associate degree: academic program  Occupational History  .  Occupation: Waitress  Tobacco Use  . Smoking status: Former Smoker    Packs/day: 0.10    Years: 26.00    Pack years: 2.60    Types: Cigarettes  . Smokeless tobacco: Never Used  . Tobacco comment: 4 cigs per day   Vaping Use  . Vaping Use: Every day  . Substances: Nicotine  Substance and Sexual Activity  . Alcohol use: No    Comment: quit 2018  . Drug use: No  . Sexual activity: Yes    Birth control/protection: None    Comment: married, 3 kids, vaginal, trying for another  Other Topics Concern  . Not on file  Social History Narrative   Lives at home with her mother, husband and three daughters.   Right-handed.   Approximately 20 cups caffeine per day (4-5 glasses holding 32oz of tea).   Social Determinants of Health   Financial Resource Strain: Not on file  Food Insecurity: Not on file  Transportation Needs: Not on file  Physical Activity: Not on file  Stress: Not on file  Social Connections: Not on file  Intimate Partner Violence: Not on file        Objective:    BP 115/77   Pulse 83   Temp 98.1 F (36.7 C) (Temporal)   Ht 5\' 9"  (1.753 m)   Wt 201 lb (91.2 kg)   SpO2 100%   BMI 29.68 kg/m   Wt Readings from Last 3 Encounters:  02/09/21 201 lb (91.2 kg)  01/13/21 200 lb (90.7 kg)  11/04/20 210 lb 6.4 oz (95.4 kg)    Physical Exam Vitals reviewed.  Constitutional:      General: She is not in acute distress.    Appearance: Normal appearance. She is overweight. She is not ill-appearing, toxic-appearing or diaphoretic.  HENT:     Head: Normocephalic and atraumatic.  Eyes:     General: No scleral icterus.       Right eye: No discharge.        Left eye: No discharge.     Conjunctiva/sclera: Conjunctivae normal.  Cardiovascular:     Rate and Rhythm: Normal rate.  Pulmonary:     Effort: Pulmonary effort is normal. No respiratory distress.  Musculoskeletal:        General: Normal range of motion.     Cervical back: Normal range of motion.  Skin:     General: Skin is warm and dry.     Capillary Refill: Capillary refill takes less than 2 seconds.  Neurological:     General: No focal deficit present.     Mental Status: She is alert and oriented to person, place, and time. Mental status is at baseline.  Psychiatric:        Mood and Affect: Mood normal.        Behavior: Behavior normal.        Thought Content: Thought content normal.        Judgment: Judgment normal.     Lab Results  Component Value Date   TSH 0.169 (  L) 01/13/2021   Lab Results  Component Value Date   WBC 7.0 01/13/2021   HGB 11.0 (L) 01/13/2021   HCT 37.6 01/13/2021   MCV 61 (L) 01/13/2021   PLT 464 (H) 01/13/2021   Lab Results  Component Value Date   NA 137 01/13/2021   K 4.6 01/13/2021   CO2 18 (L) 01/13/2021   GLUCOSE 88 01/13/2021   BUN 9 01/13/2021   CREATININE 0.73 01/13/2021   BILITOT 0.4 01/13/2021   ALKPHOS 66 01/13/2021   AST 17 01/13/2021   ALT 14 01/13/2021   PROT 7.3 01/13/2021   ALBUMIN 4.5 01/13/2021   CALCIUM 9.7 01/13/2021   ANIONGAP 7 05/09/2019   Lab Results  Component Value Date   CHOL 206 (H) 06/10/2019   Lab Results  Component Value Date   HDL 22 (L) 06/10/2019   Lab Results  Component Value Date   LDLCALC 106 (H) 06/10/2019   Lab Results  Component Value Date   TRIG 389 (H) 06/10/2019   Lab Results  Component Value Date   CHOLHDL 9.4 (H) 06/10/2019   Lab Results  Component Value Date   HGBA1C 5.4 01/13/2021

## 2021-02-10 LAB — LIPID PANEL
Chol/HDL Ratio: 6.3 ratio — ABNORMAL HIGH (ref 0.0–4.4)
Cholesterol, Total: 221 mg/dL — ABNORMAL HIGH (ref 100–199)
HDL: 35 mg/dL — ABNORMAL LOW (ref >39)
LDL Chol Calc (NIH): 170 mg/dL — ABNORMAL HIGH (ref 0–99)
Triglycerides: 91 mg/dL (ref 0–149)
VLDL Cholesterol Cal: 16 mg/dL (ref 5–40)

## 2021-02-11 ENCOUNTER — Ambulatory Visit: Payer: Medicaid Other | Admitting: Family Medicine

## 2021-02-13 ENCOUNTER — Encounter: Payer: Self-pay | Admitting: Family Medicine

## 2021-02-13 DIAGNOSIS — E782 Mixed hyperlipidemia: Secondary | ICD-10-CM

## 2021-02-13 DIAGNOSIS — I252 Old myocardial infarction: Secondary | ICD-10-CM

## 2021-02-15 NOTE — Telephone Encounter (Signed)
Patient replying from labs. Please advise

## 2021-02-16 MED ORDER — ATORVASTATIN CALCIUM 80 MG PO TABS: 80.0000 mg | ORAL_TABLET | Freq: Every day | ORAL | 3 refills | Status: DC

## 2021-02-25 ENCOUNTER — Ambulatory Visit (INDEPENDENT_AMBULATORY_CARE_PROVIDER_SITE_OTHER): Payer: Medicaid Other | Admitting: General Surgery

## 2021-02-25 ENCOUNTER — Encounter: Payer: Self-pay | Admitting: General Surgery

## 2021-02-25 ENCOUNTER — Other Ambulatory Visit: Payer: Self-pay

## 2021-02-25 VITALS — BP 120/85 | HR 85 | Temp 97.4°F | Resp 14 | Ht 69.0 in | Wt 200.0 lb

## 2021-02-25 DIAGNOSIS — N6452 Nipple discharge: Secondary | ICD-10-CM

## 2021-02-25 NOTE — Progress Notes (Signed)
[facility] History and Physical  Reason for Referral: Right nipple discharge  Referring Physician:   Loman Brooklyn, FNP   Chief Complaint    New Patient (Initial Visit)      Kaitlin Torres is a 40 y.o. female.  HPI:  Kaitlin Torres is a 40 yo who has a history of right breast drainage for years she says. It is spontaneous and can be expressed. She says it gets on her bra and is bloody appearing in nature. She has never had any drainage from the left nipple but did have a cyst removed from the left breast.  She has a history of coronary artery disease and NSTEMI with stent placement in 2020.  She has been followed by cardiology and has no complaints of chest pain or shortness of breath. She says she works out at [employer] and has no issues. She followed with Dr. Margaretann Loveless and has an appointment in August.    Past Medical History:  Diagnosis Date  . Anxiety   . 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 Cough   . Generalized headaches   . Heart attack ([place]) 04/2019  . Hyperlipidemia   . Hypertension   . PCOS (polycystic ovarian syndrome)   . Rectal pain   . Vitamin D deficiency 01/15/2021    Past Surgical History:  Procedure Laterality Date  . ANKLE SURGERY  july 2005   screws placed in right ankle   . CORONARY STENT INTERVENTION N/A 05/08/2019   Procedure: CORONARY STENT INTERVENTION;  Surgeon: Martinique, Peter M, MD;  Location: [facility];  Service: Cardiovascular;  Laterality: N/A;  . FRACTURE SURGERY  2005   right ankle  . HEMORRHOID SURGERY    . HEMORRHOID SURGERY  2015  . INCISION AND DRAINAGE BREAST ABSCESS  november 2011   left breast   . LEFT HEART CATH AND CORONARY ANGIOGRAPHY N/A 05/08/2019   Procedure: LEFT HEART CATH AND CORONARY ANGIOGRAPHY;  Surgeon: Martinique, Peter M, MD;  Location: [facility];  Service: Cardiovascular;  Laterality: N/A;    Family History  Problem Relation Age of Onset  . Hypertension Mother   . Heart disease Father         Cardiac arrest 2006  . Diabetes Father   . Hyperlipidemia Father   . Stroke Father   . Diabetes Paternal Grandmother   . Cervical cancer Maternal Grandmother   . Aneurysm Brother     Social History   Tobacco Use  . Smoking status: Former Smoker    Packs/day: 0.10    Years: 26.00    Pack years: 2.60    Types: Cigarettes  . Smokeless tobacco: Never Used  . Tobacco comment: 4 cigs per day   Vaping Use  . Vaping Use: Every day  . Substances: Nicotine  Substance Use Topics  . Alcohol use: No    Comment: quit 2018  . Drug use: No    Medications: I have reviewed the patient's current medications. Allergies as of 02/25/2021   No Known Allergies     Medication List       Accurate as of February 25, 2021  3:15 PM. If you have any questions, ask your nurse or doctor.        STOP taking these medications   Vitamin D (Ergocalciferol) 1.25 MG (50000 UNIT) Caps capsule Commonly known as: DRISDOL Stopped by: Virl Cagey, MD     TAKE these medications   aspirin EC 81 MG tablet Take  81 mg by mouth daily. Swallow whole.   atorvastatin 80 MG tablet Commonly known as: LIPITOR Take 1 tablet (80 mg total) by mouth daily.   labetalol 100 MG tablet Commonly known as: NORMODYNE Take 100 mg by mouth 2 (two) times daily.        ROS:  A comprehensive review of systems was negative except for: Integument/breast: positive for right nipple discharge, bloody spontaneous  Blood pressure 120/85, pulse 85, temperature (!) 97.4 F (36.3 C), temperature source Other (Comment), resp. rate 14, height 5\' 9"  (1.753 m), weight 200 lb (90.7 kg), SpO2 94 %. Physical Exam Vitals reviewed.  Constitutional:      Appearance: Normal appearance.  HENT:     Head: Normocephalic.     Nose: Nose normal.  Eyes:     Extraocular Movements: Extraocular movements intact.  Cardiovascular:     Rate and Rhythm: Normal rate and regular rhythm.  Pulmonary:     Effort: Pulmonary effort is normal.      Breath sounds: Normal breath sounds.  Chest:  Breasts:     Right: Nipple discharge present. No mass, skin change, tenderness or axillary adenopathy.     Left: No mass, nipple discharge, skin change, tenderness or axillary adenopathy.      Comments: Left breast with prior scar inferior to areola, right breast with expressed bloody drainage from single duct, no masses appreciated, mid sternum piercing Abdominal:     General: There is no distension.     Palpations: Abdomen is soft.     Tenderness: There is no abdominal tenderness.  Musculoskeletal:        General: No swelling. Normal range of motion.     Cervical back: Normal range of motion.  Lymphadenopathy:     Upper Body:     Right upper body: No axillary adenopathy.     Left upper body: No axillary adenopathy.  Skin:    General: Skin is warm.  Neurological:     General: No focal deficit present.     Mental Status: She is alert and oriented to person, place, and time.  Psychiatric:        Mood and Affect: Mood normal.        Behavior: Behavior normal.        Thought Content: Thought content normal.        Judgment: Judgment normal.     Results: CLINICAL DATA:  40 year old female with bloody RIGHT nipple discharge for several months. Baseline mammogram.  EXAM: DIGITAL DIAGNOSTIC BILATERAL MAMMOGRAM WITH TOMOSYNTHESIS AND CAD; ULTRASOUND RIGHT BREAST LIMITED  TECHNIQUE: Bilateral digital diagnostic mammography and breast tomosynthesis was performed. The images were evaluated with computer-aided detection.; Targeted ultrasound examination of the right breast was performed.  COMPARISON:  None.  ACR Breast Density Category b: There are scattered areas of fibroglandular density.  FINDINGS: 2D/3D full field views of both breasts and spot compression view of the RIGHT breast demonstrate multiple dilated ducts extending from the nipple laterally.  No suspicious mass, distortion or worrisome calcifications are  noted within either breast.  On physical exam, the patient was able to expressed slightly bloody RIGHT nipple discharge from a single duct.  Targeted ultrasound is performed, showing multiple mildly dilated ducts within the RIGHT breast extending from the nipple laterally at least 2-3 cm. No definite intraductal mass is noted. No suspicious mass, distortion or worrisome shadowing is noted in the RETROAREOLAR RIGHT breast.  No abnormal RIGHT axillary lymph nodes are noted.  IMPRESSION: 1. Bloody RIGHT  nipple discharge without identifiable cause on this mammogram or ultrasound. 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 Breast MRI and surgical consultation is recommended. 2. No mammographic evidence of breast malignancy.  RECOMMENDATION: Bilateral breast MRI with and without contrast.  Surgical consultation for evaluation of bloody RIGHT nipple discharge.  I have discussed the findings and recommendations with the patient. If applicable, a reminder letter will be sent to the patient regarding the next appointment.  BI-RADS CATEGORY  2: Benign.   Electronically Signed   By: Margarette Canada M.D.   On: 12/18/2020 15:41   Assessment & Plan:  Kaitlin Torres is a 40 y.o. female with right nipple discharge and no findings on [place] and Mammogram. Recommendations for MRI discussed and patient has a piercing that she does not want removed. She also wants to proceed with excision because of the annoyance of the drainage itself. Discussed that the piercing can still cause a burn during surgery but will place tape on it to try to mitigate any issues.  Discussed excisional ductal biopsy and lacrimal probe/ dye use. Discussed risk of bleeding, infection, finding cancer, issues with her piercing.   Discussed preop COVID testing. Will reach out to Cardiologist Dr. Margaretann Loveless to ensure no additional work up needed but patient is active and has no chest pain.    All questions were answered to the satisfaction of the patient.    Virl Cagey 02/25/2021, 3:15 PM

## 2021-02-25 NOTE — Patient Instructions (Signed)
Ductal excision.

## 2021-03-01 NOTE — H&P (Signed)
[facility] History and Physical  Reason for Referral: Right nipple discharge  Referring Physician:   Joyce, Britney F, FNP   Chief Complaint    New Patient (Initial Visit)      Kaitlin Torres is a 39 y.o. female.  HPI:  Kaitlin Torres is a 39 yo who has a history of right breast drainage for years she says. It is spontaneous and can be expressed. She says it gets on her bra and is bloody appearing in nature. She has never had any drainage from the left nipple but did have a cyst removed from the left breast.  She has a history of coronary artery disease and NSTEMI with stent placement in 2020.  She has been followed by cardiology and has no complaints of chest pain or shortness of breath. 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 She followed with Dr. Acharya and has an appointment in August.    Past Medical History:  Diagnosis Date  . Anxiety   . 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 Cough   . Generalized headaches   . Heart attack ([facility]) 04/2019  . Hyperlipidemia   . Hypertension   . PCOS (polycystic ovarian syndrome)   . Rectal pain   . Vitamin D deficiency 01/15/2021    Past Surgical History:  Procedure Laterality Date  . ANKLE SURGERY  july 2005   screws placed in right ankle   . CORONARY STENT INTERVENTION N/A 05/08/2019   Procedure: CORONARY STENT INTERVENTION;  Surgeon: Jordan, Peter M, MD;  Location: [facility];  Service: Cardiovascular;  Laterality: N/A;  . FRACTURE SURGERY  2005   right ankle  . HEMORRHOID SURGERY    . HEMORRHOID SURGERY  2015  . INCISION AND DRAINAGE BREAST ABSCESS  november 2011   left breast   . LEFT HEART CATH AND CORONARY ANGIOGRAPHY N/A 05/08/2019   Procedure: LEFT HEART CATH AND CORONARY ANGIOGRAPHY;  Surgeon: Jordan, Peter M, MD;  Location: [facility];  Service: Cardiovascular;  Laterality: N/A;    Family History  Problem Relation Age of Onset  . Hypertension Mother   . Heart disease Father         Cardiac arrest 2006  . Diabetes Father   . Hyperlipidemia Father   . Stroke Father   . Diabetes Paternal Grandmother   . Cervical cancer Maternal Grandmother   . Aneurysm Brother     Social History   Tobacco Use  . Smoking status: Former Smoker    Packs/day: 0.10    Years: 26.00    Pack years: 2.60    Types: Cigarettes  . Smokeless tobacco: Never Used  . Tobacco comment: 4 cigs per day   Vaping Use  . Vaping Use: Every day  . Substances: Nicotine  Substance Use Topics  . Alcohol use: No    Comment: quit 2018  . Drug use: No    Medications: I have reviewed the patient's current medications. Allergies as of 02/25/2021   No Known Allergies     Medication List       Accurate as of February 25, 2021  3:15 PM. If you have any questions, ask your nurse or doctor.        STOP taking these medications   Vitamin D (Ergocalciferol) 1.25 MG (50000 UNIT) Caps capsule Commonly known as: DRISDOL Stopped by: Bassem Bernasconi C Emalie Mcwethy, MD     TAKE these medications   aspirin EC 81 MG tablet Take   81 mg by mouth daily. Swallow whole.   atorvastatin 80 MG tablet Commonly known as: LIPITOR Take 1 tablet (80 mg total) by mouth daily.   labetalol 100 MG tablet Commonly known as: NORMODYNE Take 100 mg by mouth 2 (two) times daily.        ROS:  A comprehensive review of systems was negative except for: Integument/breast: positive for right nipple discharge, bloody spontaneous  Blood pressure 120/85, pulse 85, temperature (!) 97.4 F (36.3 C), temperature source Other (Comment), resp. rate 14, height 5' 9" (1.753 m), weight 200 lb (90.7 kg), SpO2 94 %. Physical Exam Vitals reviewed.  Constitutional:      Appearance: Normal appearance.  HENT:     Head: Normocephalic.     Nose: Nose normal.  Eyes:     Extraocular Movements: Extraocular movements intact.  Cardiovascular:     Rate and Rhythm: Normal rate and regular rhythm.  Pulmonary:     Effort: Pulmonary effort is normal.      Breath sounds: Normal breath sounds.  Chest:  Breasts:     Right: Nipple discharge present. No mass, skin change, tenderness or axillary adenopathy.     Left: No mass, nipple discharge, skin change, tenderness or axillary adenopathy.      Comments: Left breast with prior scar inferior to areola, right breast with expressed bloody drainage from single duct, no masses appreciated, mid sternum piercing Abdominal:     General: There is no distension.     Palpations: Abdomen is soft.     Tenderness: There is no abdominal tenderness.  Musculoskeletal:        General: No swelling. Normal range of motion.     Cervical back: Normal range of motion.  Lymphadenopathy:     Upper Body:     Right upper body: No axillary adenopathy.     Left upper body: No axillary adenopathy.  Skin:    General: Skin is warm.  Neurological:     General: No focal deficit present.     Mental Status: She is alert and oriented to person, place, and time.  Psychiatric:        Mood and Affect: Mood normal.        Behavior: Behavior normal.        Thought Content: Thought content normal.        Judgment: Judgment normal.     Results: CLINICAL DATA:  39-year-old female with bloody RIGHT nipple discharge for several months. Baseline mammogram.  EXAM: DIGITAL DIAGNOSTIC BILATERAL MAMMOGRAM WITH TOMOSYNTHESIS AND CAD; ULTRASOUND RIGHT BREAST LIMITED  TECHNIQUE: Bilateral digital diagnostic mammography and breast tomosynthesis was performed. The images were evaluated with computer-aided detection.; Targeted ultrasound examination of the right breast was performed.  COMPARISON:  None.  ACR Breast Density Category b: There are scattered areas of fibroglandular density.  FINDINGS: 2D/3D full field views of both breasts and spot compression view of the RIGHT breast demonstrate multiple dilated ducts extending from the nipple laterally.  No suspicious mass, distortion or worrisome calcifications are  noted within either breast.  On physical exam, the patient was able to expressed slightly bloody RIGHT nipple discharge from a single duct.  Targeted ultrasound is performed, showing multiple mildly dilated ducts within the RIGHT breast extending from the nipple laterally at least 2-3 cm. No definite intraductal mass is noted. No suspicious mass, distortion or worrisome shadowing is noted in the RETROAREOLAR RIGHT breast.  No abnormal RIGHT axillary lymph nodes are noted.  IMPRESSION: 1. Bloody RIGHT   nipple discharge without identifiable cause on this mammogram or ultrasound. 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 Breast MRI and surgical consultation is recommended. 2. No mammographic evidence of breast malignancy.  RECOMMENDATION: Bilateral breast MRI with and without contrast.  Surgical consultation for evaluation of bloody RIGHT nipple discharge.  I have discussed the findings and recommendations with the patient. If applicable, a reminder letter will be sent to the patient regarding the next appointment.  BI-RADS CATEGORY  2: Benign.   Electronically Signed   By: Jeffrey  Hu M.D.   On: 12/18/2020 15:41   Assessment & Plan:  Kaitlin Torres is a 39 y.o. female with right nipple discharge and no findings on US and Mammogram. Recommendations for MRI discussed and patient has a piercing that she does not want removed. She also wants to proceed with excision because of the annoyance of the drainage itself. Discussed that the piercing can still cause a burn during surgery but will place tape on it to try to mitigate any issues.  Discussed excisional ductal biopsy and lacrimal probe/ dye use. Discussed risk of bleeding, infection, finding cancer, issues with her piercing.   Discussed preop COVID testing. Will reach out to Cardiologist Dr. Acharya to ensure no additional work up needed but patient is active and has no chest pain.    All questions were answered to the satisfaction of the patient.    Kaitlin Torres 02/25/2021, 3:15 PM       

## 2021-03-08 DIAGNOSIS — K1230 Oral mucositis (ulcerative), unspecified: Secondary | ICD-10-CM | POA: Diagnosis not present

## 2021-03-10 ENCOUNTER — Encounter: Payer: Self-pay | Admitting: Cardiovascular Disease

## 2021-03-10 ENCOUNTER — Ambulatory Visit (INDEPENDENT_AMBULATORY_CARE_PROVIDER_SITE_OTHER): Payer: Medicaid Other | Admitting: Cardiovascular Disease

## 2021-03-10 ENCOUNTER — Other Ambulatory Visit: Payer: Self-pay

## 2021-03-10 VITALS — BP 134/84 | HR 64 | Ht 69.0 in | Wt 199.0 lb

## 2021-03-10 DIAGNOSIS — I2510 Atherosclerotic heart disease of native coronary artery without angina pectoris: Secondary | ICD-10-CM

## 2021-03-10 DIAGNOSIS — E7801 Familial hypercholesterolemia: Secondary | ICD-10-CM | POA: Diagnosis not present

## 2021-03-10 DIAGNOSIS — I10 Essential (primary) hypertension: Secondary | ICD-10-CM | POA: Diagnosis not present

## 2021-03-10 DIAGNOSIS — Z01810 Encounter for preprocedural cardiovascular examination: Secondary | ICD-10-CM | POA: Diagnosis not present

## 2021-03-10 NOTE — Progress Notes (Signed)
Cardiology Office Note:    Date:  03/10/2021   ID:  Kaitlin Torres, DOB Jan 05, 1981, MRN [id number]  PCP:  Loman Brooklyn, FNP   [facility] Providers Cardiologist:  Elouise Munroe, MD     Referring MD: Loman Brooklyn, FNP   Chief Complaint  Patient presents with  . Pre-op Exam    History of Present Illness:    Kaitlin Torres is a 40 y.o. female with a hx of premature onset CAD, probable familial heterozygous hypercholesterolemia, presenting for preoperative examination before planned breast surgery on May 11.  She had an acute non-STEMI in July 2020 and received 2 drug-eluting stents to the right coronary artery.  She has not had any cardiac issues since.  Her angina was described as "an elephant sitting on my chest and would not get off".  She has had a couple of episodes that raise her concern over the last few months, most recently discomfort in her left arm, very different from her original angina and not associated with physical activity.  Goes to the gym at least 2-3 times a week where she walks on the treadmill at 3.5 miles an hour for half an hour to 1 hour and does weights and leg presses.  She never has chest discomfort during that level of activity.  She works full-time as a Chief Operating Officer at [employer].  The patient specifically denies any chest pain with exertion, dyspnea at rest or with exertion, orthopnea, paroxysmal nocturnal dyspnea, syncope, palpitations, focal neurological deficits, intermittent claudication, lower extremity edema, unexplained weight gain, cough, hemoptysis or wheezing.  He was off her cholesterol medicines for few months and her LDL is up to 170.  She has just restarted that medication.  She also has a low HDL cholesterol.  She is trying to quit smoking although she is using a vape device.  She is trying to quit this as well.  Her father had his first myocardial infarction when he was in his 4s and has passed away.   Past Medical History:   Diagnosis Date  . Anxiety   . 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 Cough   . Generalized headaches   . Heart attack ([place]) 04/2019  . Hyperlipidemia   . Hypertension   . PCOS (polycystic ovarian syndrome)   . Rectal pain   . Vitamin D deficiency 01/15/2021    Past Surgical History:  Procedure Laterality Date  . ANKLE SURGERY  july 2005   screws placed in right ankle   . CORONARY STENT INTERVENTION N/A 05/08/2019   Procedure: CORONARY STENT INTERVENTION;  Surgeon: Martinique, Peter M, MD;  Location: [place] [facility];  Service: Cardiovascular;  Laterality: N/A;  . FRACTURE SURGERY  2005   right ankle  . HEMORRHOID SURGERY    . HEMORRHOID SURGERY  2015  . INCISION AND DRAINAGE BREAST ABSCESS  november 2011   left breast   . LEFT HEART CATH AND CORONARY ANGIOGRAPHY N/A 05/08/2019   Procedure: LEFT HEART CATH AND CORONARY ANGIOGRAPHY;  Surgeon: Martinique, Peter M, MD;  Location: [place] [facility];  Service: Cardiovascular;  Laterality: N/A;    Current Medications: Current Meds  Medication Sig  . aspirin EC 81 MG tablet Take 81 mg by mouth daily. Swallow whole.  Marland Kitchen atorvastatin (LIPITOR) 80 MG tablet Take 1 tablet (80 mg total) by mouth daily.  Marland Kitchen labetalol (NORMODYNE) 100 MG tablet Take 100 mg by mouth 2 (two) times daily.  . Vitamin D, Ergocalciferol, (DRISDOL) 1.25 MG (  50000 UNIT) CAPS capsule Take 50,000 Units by mouth every Monday.     Allergies:   Patient has no known allergies.   Social History   Socioeconomic History  . Marital status: Married    Spouse name: Not on file  . Number of children: 3  . Years of education: 91  . Highest education level: Associate degree: academic program  Occupational History  . Occupation: Waitress  Tobacco Use  . Smoking status: Former Smoker    Packs/day: 0.10    Years: 26.00    Pack years: 2.60    Types: Cigarettes  . Smokeless tobacco: Never Used  . Tobacco comment: 4 cigs per day   Vaping Use  . Vaping Use: Every day  . Substances: Nicotine   Substance and Sexual Activity  . Alcohol use: No    Comment: quit 2018  . Drug use: No  . Sexual activity: Yes    Birth control/protection: None    Comment: married, 3 kids, vaginal, trying for another  Other Topics Concern  . Not on file  Social History Narrative   Lives at home with her mother, husband and three daughters.   Right-handed.   Approximately 20 cups caffeine per day (4-5 glasses holding 32oz of tea).   Social Determinants of Health   Financial Resource Strain: Not on file  Food Insecurity: Not on file  Transportation Needs: Not on file  Physical Activity: Not on file  Stress: Not on file  Social Connections: Not on file     Family History: The patient's family history includes Aneurysm in her brother; Cervical cancer in her maternal grandmother; Diabetes in her father and paternal grandmother; Heart disease in her father; Hyperlipidemia in her father; Hypertension in her mother; Stroke in her father.  ROS:   Please see the history of present illness.     All other systems reviewed and are negative.  EKGs/Labs/Other Studies Reviewed:    The following studies were reviewed today:   EKG:  EKG is ordered today.  The ekg ordered today demonstrates normal sinus rhythm, normal tracing.  Recent Labs: 01/13/2021: ALT 14; BUN 9; Creatinine, Ser 0.73; Hemoglobin 11.0; Platelets 464; Potassium 4.6; Sodium 137; TSH 0.169  Recent Lipid Panel    Component Value Date/Time   CHOL 221 (H) 02/09/2021 1659   TRIG 91 02/09/2021 1659   HDL 35 (L) 02/09/2021 1659   CHOLHDL 6.3 (H) 02/09/2021 1659   CHOLHDL 10.0 05/09/2019 0357   VLDL 47 (H) 05/09/2019 0357   LDLCALC 170 (H) 02/09/2021 1659     Risk Assessment/Calculations:       Physical Exam:    VS:  BP 134/84   Pulse 64   Ht 5\' 9"  (1.753 m)   Wt 199 lb (90.3 kg)   SpO2 99%   BMI 29.39 kg/m     Wt Readings from Last 3 Encounters:  03/10/21 199 lb (90.3 kg)  02/25/21 200 lb (90.7 kg)  02/09/21 201 lb  (91.2 kg)     GEN: Overweight well nourished, well developed in no acute distress HEENT: Normal NECK: No JVD; No carotid bruits LYMPHATICS: No lymphadenopathy CARDIAC:  RRR, no murmurs, rubs, gallops RESPIRATORY:  Clear to auscultation without rales, wheezing or rhonchi  ABDOMEN: Soft, non-tender, non-distended MUSCULOSKELETAL:  No edema; No deformity  SKIN: Warm and dry NEUROLOGIC:  Alert and oriented x 3 PSYCHIATRIC:  Normal affect   ASSESSMENT:    1. Coronary artery disease involving native coronary artery of native heart without  angina pectoris   2. Essential hypertension   3. Heterozygous familial hypercholesterolemia   4. Preoperative cardiovascular examination    PLAN:    In order of problems listed above:  1. CAD: Very early onset, but currently asymptomatic despite a very active lifestyle.  No additional testing is needed at this time.  Encouraged her to stop vaping and congratulated her on not using cigarettes.  Very important to aggressively treat her highly unfavorable lipid profile.  She has successfully lost about 45 pounds and is to be congratulated. 2. HLP: Despite weight loss, her HDL is still quite low and more importantly her LDL cholesterol is severely elevated.  She probably has familial heterozygous hypercholesterolemia, especially taking into account to be very early onset of coronary disease in her father at age 61.  I told her I am very pleased that she started taking her statin again.  Should have a repeat lipid profile when she sees her primary cardiologist back in August. 3. HTN: Fair control.  Just a few days ago her systolic blood pressure was 120 in the surgeon's office.  Important to continue the labetalol in the perioperative period.  Discussed the risk of beta-blocker rebound and the need to take this medication conscientiously. 4. Preop exam: Surgery with low cardiovascular risk, asymptomatic patient with excellent functional status.  Low risk for major  complications with breast surgery.        Medication Adjustments/Labs and Tests Ordered: Current medicines are reviewed at length with the patient today.  Concerns regarding medicines are outlined above.  Orders Placed This Encounter  Procedures  . EKG 12-Lead   No orders of the defined types were placed in this encounter.   Patient Instructions  Medication Instructions:  No changes *If you need a refill on your cardiac medications before your next appointment, please call your pharmacy*   Lab Work: None ordered If you have labs (blood work) drawn today and your tests are completely normal, you will receive your results only by: Marland Kitchen MyChart Message (if you have MyChart) OR . A paper copy in the mail If you have any lab test that is abnormal or we need to change your treatment, we will call you to review the results.   Testing/Procedures: None ordered   Follow-Up: At [facility], you and your health needs are our priority.  As part of our continuing mission to provide you with exceptional heart care, we have created designated Provider Care Teams.  These Care Teams include your primary Cardiologist (physician) and Advanced Practice Providers (APPs -  Physician Assistants and Nurse Practitioners) who all work together to provide you with the care you need, when you need it.  We recommend signing up for the patient portal called "MyChart".  Sign up information is provided on this After Visit Summary.  MyChart is used to connect with patients for Virtual Visits (Telemedicine).  Patients are able to view lab/test results, encounter notes, upcoming appointments, etc.  Non-urgent messages can be sent to your provider as well.   To learn more about what you can do with MyChart, go to [email or web address].    Your next appointment:   Keep your follow with Dr. Margaretann Loveless.     Signed, Sanda Klein, MD  03/10/2021 10:04 AM    [place]

## 2021-03-10 NOTE — Patient Instructions (Signed)
Medication Instructions:  No changes *If you need a refill on your cardiac medications before your next appointment, please call your pharmacy*   Lab Work: None ordered If you have labs (blood work) drawn today and your tests are completely normal, you will receive your results only by: Marland Kitchen MyChart Message (if you have MyChart) OR . A paper copy in the mail If you have any lab test that is abnormal or we need to change your treatment, we will call you to review the results.   Testing/Procedures: None ordered   Follow-Up: At [facility], you and your health needs are our priority.  As part of our continuing mission to provide you with exceptional heart care, we have created designated Provider Care Teams.  These Care Teams include your primary Cardiologist (physician) and Advanced Practice Providers (APPs -  Physician Assistants and Nurse Practitioners) who all work together to provide you with the care you need, when you need it.  We recommend signing up for the patient portal called "MyChart".  Sign up information is provided on this After Visit Summary.  MyChart is used to connect with patients for Virtual Visits (Telemedicine).  Patients are able to view lab/test results, encounter notes, upcoming appointments, etc.  Non-urgent messages can be sent to your provider as well.   To learn more about what you can do with MyChart, go to [email or web address].    Your next appointment:   Keep your follow with Dr. Margaretann Loveless.

## 2021-03-11 NOTE — Patient Instructions (Signed)
Kaitlin Torres  03/11/2021     @PREFPERIOPPHARMACY @   Your procedure is scheduled on  03/17/2021.   Report to [facility] at  0730  A.M.   Call this number if you have problems the morning of surgery:  [phone]   Remember:  Do not eat or drink after midnight.                          Take these medicines the morning of surgery with A SIP OF WATER  labetolol.    Do not wear jewelry, make-up or nail polish.  Do not wear lotions, powders, or perfumes, or deodorant.  Do not shave 48 hours prior to surgery.  Men may shave face and neck.  Do not bring valuables to the hospital.  [facility] is not responsible for any belongings or valuables.   Place clean sheets on your bed the night before your procedure and DO NOT sleep with pets this night.  Shower with CHG the night before and the morning of your procedure. DO NOT use CHG on your face, hair or genitals.  After each shower, dry off with a clean towel, put on clean, comfortable clothes and brush your teeth.    Contacts, dentures or bridgework may not be worn into surgery.  Leave your suitcase in the car.  After surgery it may be brought to your room.  For patients admitted to the hospital, discharge time will be determined by your treatment team.  Patients discharged the day of surgery will not be allowed to drive home and must have someone with them for 24 hours.   Special instructions:  DO NOT smoke tobacco or vape for 24 hours before your procedure.  Please read over the following fact sheets that you were given. Coughing and Deep Breathing, Surgical Site Infection Prevention, Anesthesia Post-op Instructions and Care and Recovery After Surgery       Incision Care, Adult An incision is a cut that a doctor makes in your skin for surgery. Most times, these cuts are closed after surgery. Your cut from surgery may be closed with:  Stitches (sutures).  Staples.  Skin glue.  Skin tape (adhesive)  strips. You may need to return to your doctor to have stitches or staples taken out. This may happen many days or many weeks after your surgery. You need to take good care of your cut so it does not get infected. Follow instructions from your doctor about how to care for your cut. Supplies needed:  Soap and water.  A clean hand towel.  Wound cleanser.  A clean bandage (dressing), if needed.  Cream or ointment, if told by your doctor.  Clean gauze. How to care for your cut from surgery Cleaning your cut Ask your doctor how to clean your cut. You may need to:  Use mild soap and water, or a wound cleanser.  Use a clean gauze to pat your cut dry after you clean it. Changing your bandage  Wash your hands with soap and water for at least 20 seconds before and after you change your bandage. If you cannot use soap and water, use hand sanitizer.  Change your bandage as told by your doctor.  Leave stitches, staples, skin glue, or skin tape strips in place. They may need to stay in place for 2 weeks or longer. If tape strips get loose and curl up, you may trim the loose  edges. Do not remove tape strips completely unless your doctor says it is okay.  Put a cream or ointment on your cut. Do this only as told.  Cover your cut with a clean bandage.  Ask your doctor when you can leave your cut uncovered. Checking for infection Check your cut area every day for signs of infection. Check for:  More redness, swelling, or pain.  More fluid or blood.  Warmth.  Pus or a bad smell.   Follow these instructions at home Medicines  Take over-the-counter and prescription medicines only as told by your doctor.  If you were prescribed an antibiotic medicine, cream, or ointment, use it as told by your doctor. Do not stop using the antibiotic even if your condition improves. Eating and drinking  Eat foods that have a lot of certain nutrients, such as protein, vitamin A, and vitamin C. These  foods help your cut heal. ? Foods rich in protein include meat, fish, eggs, dairy, beans, and nuts. ? Foods rich in vitamin A include carrots and dark green, leafy vegetables. ? Foods rich in vitamin C include citrus fruits, tomatoes, broccoli, and peppers.  Drink enough fluid to keep your pee (urine) pale yellow. General instructions  Do not take baths, swim, use a hot tub, or put your cut underwater until your doctor approves. Ask your doctor if you may take showers. You may only be allowed to take sponge baths.  Limit movement around your cut. This helps with healing. ? Try not to strain, lift, or exercise for the first 2 weeks, or for as long as told by your doctor. ? Return to your normal activities as told by your doctor. Ask your doctor what activities are safe for you.  Do not scratch or pick at your cut. Keep it covered as told by your doctor.  Protect your cut from the sun when you are outside for the first 6 months, or for as long as told by your doctor. Cover up the scar area or put on sunscreen that has an SPF of at least 30.  Do not use any products that contain nicotine or tobacco, such as cigarettes, e-cigarettes, and chewing tobacco. These can delay cut healing after surgery. If you need help quitting, ask your doctor.  Keep all follow-up visits as told by your doctor. This is important.   Contact a doctor if:  You have any of these signs of infection around your cut: ? More redness, swelling, or pain. ? More fluid or blood. ? Warmth. ? Pus or a bad smell.  You have a fever.  You feel like you may vomit (nauseous).  You vomit.  You are dizzy.  Your stitches, staples, skin glue, or tape strips come undone. Get help right away if:  Your cut has a red streak coming from it.  Your cut bleeds through your bandage, and bleeding does not stop with gentle pressure.  Your cut opens up and comes apart.  Your body reacts very badly to an infection. This may  include: ? A fever, chills, or feeling cold. ? Feeling mixed up, worried, or nervous. ? Very bad pain. ? Trouble breathing. ? A fast heartbeat. ? Clammy or sweaty skin. ? A rash. These symptoms may be an emergency. Do not wait to see if the symptoms will go away. Get medical help right away. Call your local emergency services (911 in the [place] Do not drive yourself to the hospital. Summary  Follow instructions from your doctor  about how to care for your cut from surgery.  Wash your hands with soap and water for at least 20 seconds before and after you change your bandage. If you cannot use soap and water, use hand sanitizer.  Check your cut area every day for signs of infection.  Keep all follow-up visits as told by your doctor. This is important. This information is not intended to replace advice given to you by your health care provider. Make sure you discuss any questions you have with your health care provider. Document Revised: 08/14/2019 Document Reviewed: 08/14/2019 Elsevier Patient Education  2021 [employer] Anesthesia, Adult, Care After This sheet gives you information about how to care for yourself after your procedure. Your health care provider may also give you more specific instructions. If you have problems or questions, contact your health care provider. What can I expect after the procedure? After the procedure, the following side effects are common:  Pain or discomfort at the IV site.  Nausea.  Vomiting.  Sore throat.  Trouble concentrating.  Feeling cold or chills.  Feeling weak or tired.  Sleepiness and fatigue.  Soreness and body aches. These side effects can affect parts of the body that were not involved in surgery. Follow these instructions at home: For the time period you were told by your health care provider:  Rest.  Do not participate in activities where you could fall or become injured.  Do not drive or use machinery.  Do  not drink alcohol.  Do not take sleeping pills or medicines that cause drowsiness.  Do not make important decisions or sign legal documents.  Do not take care of children on your own.   Eating and drinking  Follow any instructions from your health care provider about eating or drinking restrictions.  When you feel hungry, start by eating small amounts of foods that are soft and easy to digest (bland), such as toast. Gradually return to your regular diet.  Drink enough fluid to keep your urine pale yellow.  If you vomit, rehydrate by drinking water, juice, or clear broth. General instructions  If you have sleep apnea, surgery and certain medicines can increase your risk for breathing problems. Follow instructions from your health care provider about wearing your sleep device: ? Anytime you are sleeping, including during daytime naps. ? While taking prescription pain medicines, sleeping medicines, or medicines that make you drowsy.  Have a responsible adult stay with you for the time you are told. It is important to have someone help care for you until you are awake and alert.  Return to your normal activities as told by your health care provider. Ask your health care provider what activities are safe for you.  Take over-the-counter and prescription medicines only as told by your health care provider.  If you smoke, do not smoke without supervision.  Keep all follow-up visits as told by your health care provider. This is important. Contact a health care provider if:  You have nausea or vomiting that does not get better with medicine.  You cannot eat or drink without vomiting.  You have pain that does not get better with medicine.  You are unable to pass urine.  You develop a skin rash.  You have a fever.  You have redness around your IV site that gets worse. Get help right away if:  You have difficulty breathing.  You have chest pain.  You have blood in your urine or  stool, or you  vomit blood. Summary  After the procedure, it is common to have a sore throat or nausea. It is also common to feel tired.  Have a responsible adult stay with you for the time you are told. It is important to have someone help care for you until you are awake and alert.  When you feel hungry, start by eating small amounts of foods that are soft and easy to digest (bland), such as toast. Gradually return to your regular diet.  Drink enough fluid to keep your urine pale yellow.  Return to your normal activities as told by your health care provider. Ask your health care provider what activities are safe for you. This information is not intended to replace advice given to you by your health care provider. Make sure you discuss any questions you have with your health care provider. Document Revised: 07/09/2020 Document Reviewed: 02/06/2020 Elsevier Patient Education  2021 [employer].

## 2021-03-13 ENCOUNTER — Other Ambulatory Visit: Payer: Self-pay | Admitting: Family Medicine

## 2021-03-13 DIAGNOSIS — B370 Candidal stomatitis: Secondary | ICD-10-CM

## 2021-03-15 ENCOUNTER — Encounter (HOSPITAL_COMMUNITY)
Admission: RE | Admit: 2021-03-15 | Discharge: 2021-03-15 | Disposition: A | Discharge status: Home / Self Care | Payer: Medicaid Other | Source: Ambulatory Visit | Attending: General Surgery | Admitting: General Surgery

## 2021-03-15 ENCOUNTER — Other Ambulatory Visit: Payer: Self-pay

## 2021-03-15 ENCOUNTER — Other Ambulatory Visit (HOSPITAL_COMMUNITY)
Admission: RE | Admit: 2021-03-15 | Discharge: 2021-03-15 | Disposition: A | Discharge status: Home / Self Care | Payer: Medicaid Other | Source: Ambulatory Visit | Attending: General Surgery | Admitting: General Surgery

## 2021-03-15 ENCOUNTER — Encounter (HOSPITAL_COMMUNITY): Payer: Self-pay

## 2021-03-15 DIAGNOSIS — Z01812 Encounter for preprocedural laboratory examination: Secondary | ICD-10-CM | POA: Diagnosis not present

## 2021-03-15 DIAGNOSIS — Z20822 Contact with and (suspected) exposure to covid-19: Secondary | ICD-10-CM | POA: Diagnosis not present

## 2021-03-15 LAB — PREGNANCY, URINE: Preg Test, Ur: NEGATIVE

## 2021-03-15 LAB — SARS CORONAVIRUS 2 (TAT 6-24 HRS): SARS Coronavirus 2: NEGATIVE

## 2021-03-17 ENCOUNTER — Ambulatory Visit (HOSPITAL_COMMUNITY): Payer: Medicaid Other | Admitting: Anesthesiology

## 2021-03-17 ENCOUNTER — Ambulatory Visit (HOSPITAL_COMMUNITY)
Admission: RE | Admit: 2021-03-17 | Discharge: 2021-03-17 | Disposition: A | Discharge status: Home / Self Care | Payer: Medicaid Other | Attending: General Surgery | Admitting: General Surgery

## 2021-03-17 ENCOUNTER — Encounter (HOSPITAL_COMMUNITY)
Admission: RE | Disposition: A | Discharge status: Home / Self Care | Payer: Self-pay | Source: Home / Self Care | Attending: General Surgery

## 2021-03-17 ENCOUNTER — Encounter (HOSPITAL_COMMUNITY): Payer: Self-pay | Admitting: General Surgery

## 2021-03-17 DIAGNOSIS — Z823 Family history of stroke: Secondary | ICD-10-CM | POA: Insufficient documentation

## 2021-03-17 DIAGNOSIS — Z833 Family history of diabetes mellitus: Secondary | ICD-10-CM | POA: Diagnosis not present

## 2021-03-17 DIAGNOSIS — N6452 Nipple discharge: Secondary | ICD-10-CM | POA: Diagnosis present

## 2021-03-17 DIAGNOSIS — Z955 Presence of coronary angioplasty implant and graft: Secondary | ICD-10-CM | POA: Diagnosis not present

## 2021-03-17 DIAGNOSIS — Z8049 Family history of malignant neoplasm of other genital organs: Secondary | ICD-10-CM | POA: Insufficient documentation

## 2021-03-17 DIAGNOSIS — Z87891 Personal history of nicotine dependence: Secondary | ICD-10-CM | POA: Diagnosis not present

## 2021-03-17 DIAGNOSIS — I252 Old myocardial infarction: Secondary | ICD-10-CM | POA: Insufficient documentation

## 2021-03-17 DIAGNOSIS — I2510 Atherosclerotic heart disease of native coronary artery without angina pectoris: Secondary | ICD-10-CM | POA: Insufficient documentation

## 2021-03-17 DIAGNOSIS — D241 Benign neoplasm of right breast: Secondary | ICD-10-CM | POA: Insufficient documentation

## 2021-03-17 DIAGNOSIS — N6091 Unspecified benign mammary dysplasia of right breast: Secondary | ICD-10-CM | POA: Diagnosis not present

## 2021-03-17 DIAGNOSIS — Z8249 Family history of ischemic heart disease and other diseases of the circulatory system: Secondary | ICD-10-CM | POA: Insufficient documentation

## 2021-03-17 DIAGNOSIS — I10 Essential (primary) hypertension: Secondary | ICD-10-CM | POA: Diagnosis not present

## 2021-03-17 DIAGNOSIS — N6011 Diffuse cystic mastopathy of right breast: Secondary | ICD-10-CM | POA: Diagnosis not present

## 2021-03-17 HISTORY — PX: BREAST DUCTAL SYSTEM EXCISION: SHX5242

## 2021-03-17 SURGERY — EXCISION DUCTAL SYSTEM BREAST
Anesthesia: General | Laterality: Right

## 2021-03-17 MED ORDER — BUPIVACAINE HCL (PF) 0.5 % IJ SOLN
INTRAMUSCULAR | Status: AC
  Filled 2021-03-17: qty 30

## 2021-03-17 MED ORDER — FENTANYL CITRATE (PF) 100 MCG/2ML IJ SOLN: 25.0000 ug | INTRAMUSCULAR | Status: DC | PRN

## 2021-03-17 MED ORDER — LACTATED RINGERS IV SOLN: INTRAVENOUS | Status: DC

## 2021-03-17 MED ORDER — FENTANYL CITRATE (PF) 100 MCG/2ML IJ SOLN
INTRAMUSCULAR | Status: DC | PRN
  Administered 2021-03-17: 50 ug via INTRAVENOUS
  Administered 2021-03-17: 100 ug via INTRAVENOUS
  Administered 2021-03-17: 50 ug via INTRAVENOUS

## 2021-03-17 MED ORDER — MIDAZOLAM HCL 2 MG/2ML IJ SOLN
INTRAMUSCULAR | Status: AC
  Filled 2021-03-17: qty 2

## 2021-03-17 MED ORDER — ONDANSETRON HCL 4 MG/2ML IJ SOLN
INTRAMUSCULAR | Status: AC
  Filled 2021-03-17: qty 2

## 2021-03-17 MED ORDER — DEXAMETHASONE SODIUM PHOSPHATE 10 MG/ML IJ SOLN
INTRAMUSCULAR | Status: AC
  Filled 2021-03-17: qty 1

## 2021-03-17 MED ORDER — OXYCODONE HCL 5 MG PO TABS: 5.0000 mg | ORAL_TABLET | ORAL | 0 refills | Status: DC | PRN

## 2021-03-17 MED ORDER — CHLORHEXIDINE GLUCONATE CLOTH 2 % EX PADS: 6.0000 | MEDICATED_PAD | Freq: Once | CUTANEOUS | Status: DC

## 2021-03-17 MED ORDER — CHLORHEXIDINE GLUCONATE 0.12 % MT SOLN
15.0000 mL | Freq: Once | OROMUCOSAL | Status: AC
  Administered 2021-03-17: 15 mL via OROMUCOSAL

## 2021-03-17 MED ORDER — MIDAZOLAM HCL 5 MG/5ML IJ SOLN
INTRAMUSCULAR | Status: DC | PRN
  Administered 2021-03-17: 2 mg via INTRAVENOUS

## 2021-03-17 MED ORDER — ONDANSETRON HCL 4 MG PO TABS: 4.0000 mg | ORAL_TABLET | Freq: Three times a day (TID) | ORAL | 1 refills | Status: DC | PRN

## 2021-03-17 MED ORDER — FENTANYL CITRATE (PF) 100 MCG/2ML IJ SOLN
INTRAMUSCULAR | Status: AC
  Filled 2021-03-17: qty 2

## 2021-03-17 MED ORDER — MEPERIDINE HCL 50 MG/ML IJ SOLN: 6.2500 mg | INTRAMUSCULAR | Status: DC | PRN

## 2021-03-17 MED ORDER — ONDANSETRON HCL 4 MG/2ML IJ SOLN
INTRAMUSCULAR | Status: DC | PRN
  Administered 2021-03-17: 4 mg via INTRAVENOUS

## 2021-03-17 MED ORDER — SODIUM CHLORIDE 0.9 % IR SOLN
Status: DC | PRN
  Administered 2021-03-17: 1000 mL

## 2021-03-17 MED ORDER — DEXAMETHASONE SODIUM PHOSPHATE 10 MG/ML IJ SOLN
INTRAMUSCULAR | Status: DC | PRN
  Administered 2021-03-17: 10 mg via INTRAVENOUS

## 2021-03-17 MED ORDER — METHYLENE BLUE 0.5 % INJ SOLN
INTRAVENOUS | Status: AC
  Filled 2021-03-17: qty 10

## 2021-03-17 MED ORDER — PROPOFOL 10 MG/ML IV BOLUS
INTRAVENOUS | Status: AC
  Filled 2021-03-17: qty 20

## 2021-03-17 MED ORDER — ORAL CARE MOUTH RINSE: 15.0000 mL | Freq: Once | OROMUCOSAL | Status: AC

## 2021-03-17 MED ORDER — SODIUM CHLORIDE 0.9 % IJ SOLN
INTRAMUSCULAR | Status: DC | PRN
  Administered 2021-03-17: 2 mL via INTRAVENOUS

## 2021-03-17 MED ORDER — PROPOFOL 10 MG/ML IV BOLUS
INTRAVENOUS | Status: DC | PRN
  Administered 2021-03-17: 200 mg via INTRAVENOUS

## 2021-03-17 MED ORDER — LIDOCAINE HCL (PF) 2 % IJ SOLN
INTRAMUSCULAR | Status: AC
  Filled 2021-03-17: qty 5

## 2021-03-17 MED ORDER — CEFAZOLIN SODIUM-DEXTROSE 2-4 GM/100ML-% IV SOLN
2.0000 g | INTRAVENOUS | Status: AC
  Administered 2021-03-17: 2 g via INTRAVENOUS
  Filled 2021-03-17: qty 100

## 2021-03-17 MED ORDER — BUPIVACAINE HCL (PF) 0.5 % IJ SOLN
INTRAMUSCULAR | Status: DC | PRN
  Administered 2021-03-17: 10 mL

## 2021-03-17 MED ORDER — LIDOCAINE HCL (CARDIAC) PF 100 MG/5ML IV SOSY
PREFILLED_SYRINGE | INTRAVENOUS | Status: DC | PRN
  Administered 2021-03-17: 50 mg via INTRAVENOUS

## 2021-03-17 MED ORDER — ONDANSETRON HCL 4 MG/2ML IJ SOLN: 4.0000 mg | Freq: Once | INTRAMUSCULAR | Status: DC | PRN

## 2021-03-17 MED ORDER — SODIUM CHLORIDE (PF) 0.9 % IJ SOLN
INTRAMUSCULAR | Status: AC
  Filled 2021-03-17: qty 10

## 2021-03-17 MED ORDER — METHYLENE BLUE 0.5 % INJ SOLN
INTRAVENOUS | Status: DC | PRN
  Administered 2021-03-17: 3 mL via SUBMUCOSAL

## 2021-03-17 MED FILL — Sodium Chloride Flush IV Soln 0.9%: INTRAVENOUS | Qty: 3 | Status: AC

## 2021-03-17 SURGICAL SUPPLY — 32 items
ADH SKN CLS APL DERMABOND .7 (GAUZE/BANDAGES/DRESSINGS) ×1
APL PRP STRL LF DISP 70% ISPRP (MISCELLANEOUS) ×1
BLADE SURG 15 STRL LF DISP TIS (BLADE) ×1 IMPLANT
BLADE SURG 15 STRL SS (BLADE) ×2
CHLORAPREP W/TINT 26 (MISCELLANEOUS) ×2 IMPLANT
CLOTH BEACON ORANGE TIMEOUT ST (SAFETY) ×2 IMPLANT
COVER LIGHT HANDLE STERIS (MISCELLANEOUS) ×4 IMPLANT
COVER WAND RF STERILE (DRAPES) ×2 IMPLANT
DECANTER SPIKE VIAL GLASS SM (MISCELLANEOUS) ×2 IMPLANT
DERMABOND ADVANCED (GAUZE/BANDAGES/DRESSINGS) ×1
DERMABOND ADVANCED .7 DNX12 (GAUZE/BANDAGES/DRESSINGS) ×1 IMPLANT
ELECT REM PT RETURN 9FT ADLT (ELECTROSURGICAL) ×2
ELECTRODE REM PT RTRN 9FT ADLT (ELECTROSURGICAL) ×1 IMPLANT
GLOVE SURG ENC MOIS LTX SZ6.5 (GLOVE) ×3 IMPLANT
GLOVE SURG UNDER POLY LF SZ6.5 (GLOVE) ×3 IMPLANT
GLOVE SURG UNDER POLY LF SZ7 (GLOVE) ×3 IMPLANT
GOWN STRL REUS W/TWL LRG LVL3 (GOWN DISPOSABLE) ×4 IMPLANT
KIT TURNOVER KIT A (KITS) ×2 IMPLANT
MANIFOLD NEPTUNE II (INSTRUMENTS) ×2 IMPLANT
NDL 18GX1X1/2 (RX/OR ONLY) (NEEDLE) ×1 IMPLANT
NDL HYPO 25X1 1.5 SAFETY (NEEDLE) ×2 IMPLANT
NEEDLE 18GX1X1/2 (RX/OR ONLY) (NEEDLE) ×2 IMPLANT
NEEDLE HYPO 25X1 1.5 SAFETY (NEEDLE) ×4 IMPLANT
NS IRRIG 1000ML POUR BTL (IV SOLUTION) ×2 IMPLANT
PACK MINOR (CUSTOM PROCEDURE TRAY) ×2 IMPLANT
PAD ARMBOARD 7.5X6 YLW CONV (MISCELLANEOUS) ×2 IMPLANT
SET BASIN LINEN APH (SET/KITS/TRAYS/PACK) ×2 IMPLANT
SUT MNCRL AB 4-0 PS2 18 (SUTURE) ×2 IMPLANT
SUT SILK 2 0 SH (SUTURE) ×1 IMPLANT
SUT VIC AB 3-0 SH 27 (SUTURE)
SUT VIC AB 3-0 SH 27X BRD (SUTURE) IMPLANT
SYR CONTROL 10ML LL (SYRINGE) ×4 IMPLANT

## 2021-03-17 NOTE — Anesthesia Preprocedure Evaluation (Addendum)
Anesthesia Evaluation  Patient identified by MRN, date of birth, ID band Patient awake    Reviewed: Allergy & Precautions, NPO status , Patient's Chart, lab work & pertinent test results, reviewed documented beta blocker date and time   History of Anesthesia Complications Negative for: history of anesthetic complications  Airway Mallampati: III  TM Distance: >3 FB Neck ROM: Full    Dental  (+) Dental Advisory Given, Missing   Pulmonary former smoker,    Pulmonary exam normal breath sounds clear to auscultation       Cardiovascular Exercise Tolerance: Good hypertension, Pt. on medications and Pt. on home beta blockers + Past MI and + Cardiac Stents  Normal cardiovascular exam Rhythm:Regular Rate:Normal     Neuro/Psych  Headaches, PSYCHIATRIC DISORDERS Anxiety    GI/Hepatic negative GI ROS, Neg liver ROS,   Endo/Other  negative endocrine ROS  Renal/GU negative Renal ROS     Musculoskeletal  (+) Arthritis  (back pain),   Abdominal   Peds  Hematology  (+) anemia ,   Anesthesia Other Findings   Reproductive/Obstetrics negative OB ROS                        Anesthesia Physical Anesthesia Plan  ASA: III  Anesthesia Plan: General   Post-op Pain Management:    Induction: Intravenous  PONV Risk Score and Plan: 3 and Ondansetron, Dexamethasone and Midazolam  Airway Management Planned: LMA  Additional Equipment:   Intra-op Plan:   Post-operative Plan: Extubation in OR  Informed Consent: I have reviewed the patients History and Physical, chart, labs and discussed the procedure including the risks, benefits and alternatives for the proposed anesthesia with the patient or authorized representative who has indicated his/her understanding and acceptance.     Dental advisory given  Plan Discussed with: CRNA and Surgeon  Anesthesia Plan Comments:        Anesthesia Quick  Evaluation

## 2021-03-17 NOTE — Transfer of Care (Signed)
Immediate Anesthesia Transfer of Care Note  Patient: Kaitlin Torres  Procedure(s) Performed: RIGHT BREAST DUCTAL EXCISION (Right )  Patient Location: PACU  Anesthesia Type:General  Level of Consciousness: awake, alert , oriented and patient cooperative  Airway & Oxygen Therapy: Patient Spontanous Breathing  Post-op Assessment: Report given to RN, Post -op Vital signs reviewed and stable and Patient moving all extremities X 4  Post vital signs: Reviewed and stable  Last Vitals:  Vitals Value Taken Time  BP    Temp    Pulse 67 03/17/21 1016  Resp 13 03/17/21 1016  SpO2 98 % 03/17/21 1016  Vitals shown include unvalidated device data.  Last Pain:  Vitals:   03/17/21 0755  PainSc: 0-No pain         Complications: No complications documented.

## 2021-03-17 NOTE — Anesthesia Procedure Notes (Signed)
Procedure Name: LMA Insertion Date/Time: 03/17/2021 9:16 AM Performed by: Jonna Munro, CRNA Pre-anesthesia Checklist: Patient identified, Emergency Drugs available, Suction available, Timeout performed and Patient being monitored Patient Re-evaluated:Patient Re-evaluated prior to induction Oxygen Delivery Method: Circle system utilized Preoxygenation: Pre-oxygenation with 100% oxygen Induction Type: IV induction LMA: LMA inserted LMA Size: 4.0 Number of attempts: 1 Placement Confirmation: positive ETCO2 and breath sounds checked- equal and bilateral Dental Injury: Teeth and Oropharynx as per pre-operative assessment

## 2021-03-17 NOTE — Op Note (Addendum)
[facility] Operative Note  03/17/21  Preoperative Diagnosis:  Right breast nipple discharge    Postoperative Diagnosis: Same   Procedure(s) Performed:  Excision of right single discharging duct and breast tissue surrounding it    Surgeon: Lanell Matar. Constance Haw, MD   Assistants: No qualified resident was available    Anesthesia: General endotracheal   Anesthesiologist: Dr. Charna Elizabeth    Specimens: Right breast duct excision, suture closest to nipple    Estimated Blood Loss: Minimal   Blood Replacement: None    Complications: None   Wound Class: Clean    Operative Indications:  [name] is a 40 yo with chronic right breast discharge and no finding on [place] or mammogram. She cannot have an MRI due to an implanted piercing. She understands that there is a risk of burning her skin form the piercing with surgery. We discussed excision of the duct and risk of bleeding, infection, finding something like cancer and needing more surgery.   Findings:  Central duct with bloody fluid expressed, cannulated with lacrimal probe and dyed with methylene blue    Procedure: The patient was taken to the operating room and placed supine. General endotracheal anesthesia was induced. Intravenous antibiotics were administered per protocol. The right breast was prepared and draped in the usual sterile fashion.   I was able to express bloody discharge from a single central duct and a lacrimal probe was placed into this. I then placed a 24 gauge angiocatheter and dyed th duct with methylene blue to help with excision.  A circumareolar incision was made and carried down to the breast tissue with the scalpel. Flaps were raised. Using Metzenbaum scissors the breast tissue around the lacrimal probe was excised. At the nipple region, I dissected out the lacrimal probe form the nipple and due to the lacrimal probe having a larger mid section, I had to remove it from the nipple and reinsert it into  the dyed duct inferior to the nipple. The duct was transected, and the breast tissue around the duct to the end of the lacrimal probe was dissected out with Metzenbaum scissors.  Minimal blue dye was noted on the edge but this was not in proximity to the lacrimal probe and was likely a branching duct. The breast tissue was removed. A suture marked the duct at the nipple end.  The cavity was made hemostatic and irrigated. The dermal tissue and tissue under the nipple were closed with interrupted 3-0 Vicryl suture. The skin was closed with a running subcuticular 4-0 Monocryl suture and dermabond.    Final inspection revealed acceptable hemostasis. All counts were correct at the end of the case. The patient was awakened from anesthesia and extubated without complication.  The patient went to the PACU in stable condition.   Curlene Labrum, MD [facility] [address] [place], [place] [address] [phone] (office)

## 2021-03-17 NOTE — Progress Notes (Signed)
[facility]  Updated daughter. Will call with pathology results. Will see in office to check on wound. Rx sent to [facility].   Curlene Labrum, MD [facility] [address] [place], [place] [address] [phone] (office)

## 2021-03-17 NOTE — Anesthesia Postprocedure Evaluation (Signed)
Anesthesia Post Note  Patient: Kaitlin Torres  Procedure(s) Performed: RIGHT BREAST DUCTAL EXCISION (Right )  Patient location during evaluation: Phase II Anesthesia Type: General Level of consciousness: awake and alert and oriented Pain management: pain level controlled Vital Signs Assessment: post-procedure vital signs reviewed and stable Respiratory status: spontaneous breathing and respiratory function stable Cardiovascular status: blood pressure returned to baseline and stable Postop Assessment: no apparent nausea or vomiting Anesthetic complications: no   No complications documented.   Last Vitals:  Vitals:   03/17/21 1045 03/17/21 1101  BP: 111/76 119/77  Pulse: 66 65  Resp: 16   Temp:  36.5 C  SpO2: 100% 100%    Last Pain:  Vitals:   03/17/21 1101  TempSrc: Oral  PainSc: 0-No pain                 Phyliss Hulick C Rembert Browe

## 2021-03-17 NOTE — Interval H&P Note (Signed)
History and Physical Interval Note:  03/17/2021 8:46 AM  Kaitlin Torres  has presented today for surgery, with the diagnosis of Right Nipple Discharge.  The various methods of treatment have been discussed with the patient and family. After consideration of risks, benefits and other options for treatment, the patient has consented to  Procedure(s): [place] (Right) as a surgical intervention.  The patient's history has been reviewed, patient examined, no change in status, stable for surgery.  I have reviewed the patient's chart and labs.  Questions were answered to the patient's satisfaction.    Saw cardiology and ok with surgery. Has an eraser to put on her piercing that she cannot remove to protect the skin around the area. Discussed possible complete retro-areola duct excision if cannot get the duct to express anything.  Virl Cagey

## 2021-03-17 NOTE — Discharge Instructions (Signed)
Discharge instructions after breast surgery:   Common Complaints: Pain and bruising at the incision sites.  Swelling at the incision sites. Wear a supportive bra as desired.   Diet/ Activity: Diet as tolerated.  You may shower but do not take hot showers as this can disrupt the glue. Rest and listen to your body, but do not remain in bed all day.  Walk everyday for at least 15-20 minutes. Deep cough and move around every 1-2 hours in the first few days after surgery.  Do not lift > 10 lbs for the first 2 weeks after surgery. Do not do anything that makes you feel like you are putting unnecessary pull or stretch on the incision sites.  Do move your arm and shoulder.  Do not pick at the dermabond glue on your incision sites.  This glue film will remain in place for 1-2 weeks and will start to peel off.  Do not place lotions or balms on your incision unless instructed to specifically by Dr. Constance Haw.   Pain Expectations and Narcotics: -After surgery you will have pain associated with your incisions and this is normal. The pain is muscular and nerve pain, and will get better with time. -You are encouraged and expected to take non narcotic medications like tylenol and ibuprofen (when able) to treat pain as multiple modalities can aid with pain treatment. -Narcotics are only used when pain is severe or there is breakthrough pain. -You are not expected to have a pain score of 0 after surgery, as we cannot prevent pain. A pain score of 3-4 that allows you to be functional, move, walk, and tolerate some activity is the goal. The pain will continue to improve over the days after surgery and is dependent on your surgery. -Due to [place] law, we are only able to give a certain amount of pain medication to treat post operative pain, and we only give additional narcotics on a patient by patient basis.  -For most laparoscopic surgery, studies have shown that the majority of patients only need 10-15 narcotic pills,  and for open surgeries most patients only need 15-20.   -Having appropriate expectations of pain and knowledge of pain management with non narcotics is important as we do not want anyone to become addicted to narcotic pain medication.  -Using ice packs in the first 48 hours and heating pads after 48 hours, wearing an abdominal binder (when recommended), and using over the counter medications are all ways to help with pain management.   -Simple acts like meditation and mindfulness practices after surgery can also help with pain control and research has proven the benefit of these practices.  Medication: Take tylenol and ibuprofen as needed for pain control, alternating every 4-6 hours.  Example:  Tylenol 1000mg  @ 6am, 12noon, 6pm, 64midnight (Do not exceed 4000mg  of tylenol a day). Ibuprofen 800mg  @ 9am, 3pm, 9pm, 3am (Do not exceed 3600mg  of ibuprofen a day).  Take Roxicodone for breakthrough pain every 4 hours.  Take Colace for constipation related to narcotic pain medication. If you do not have a bowel movement in 2 days, take Miralax over the counter.  Drink plenty of water to also prevent constipation.   Contact Information: If you have questions or concerns, please call our office, [phone], Monday- Thursday 8AM-5PM and Friday 8AM-12Noon.  If it is after hours or on the weekend, please call [facility] Main [facility], [phone], [phone], and ask to speak to the surgeon on call for Dr. Constance Haw at [facility].  General Anesthesia, Adult, Care After This sheet gives you information about how to care for yourself after your procedure. Your health care provider may also give you more specific instructions. If you have problems or questions, contact your health care provider. What can I expect after the procedure? After the procedure, the following side effects are common:  Pain or discomfort at the IV site.  Nausea.  Vomiting.  Sore throat.  Trouble concentrating.  Feeling cold  or chills.  Feeling weak or tired.  Sleepiness and fatigue.  Soreness and body aches. These side effects can affect parts of the body that were not involved in surgery. Follow these instructions at home: For the time period you were told by your health care provider:  Rest.  Do not participate in activities where you could fall or become injured.  Do not drive or use machinery.  Do not drink alcohol.  Do not take sleeping pills or medicines that cause drowsiness.  Do not make important decisions or sign legal documents.  Do not take care of children on your own.   Eating and drinking  Follow any instructions from your health care provider about eating or drinking restrictions.  When you feel hungry, start by eating small amounts of foods that are soft and easy to digest (bland), such as toast. Gradually return to your regular diet.  Drink enough fluid to keep your urine pale yellow.  If you vomit, rehydrate by drinking water, juice, or clear broth. General instructions  If you have sleep apnea, surgery and certain medicines can increase your risk for breathing problems. Follow instructions from your health care provider about wearing your sleep device: ? Anytime you are sleeping, including during daytime naps. ? While taking prescription pain medicines, sleeping medicines, or medicines that make you drowsy.  Have a responsible adult stay with you for the time you are told. It is important to have someone help care for you until you are awake and alert.  Return to your normal activities as told by your health care provider. Ask your health care provider what activities are safe for you.  Take over-the-counter and prescription medicines only as told by your health care provider.  If you smoke, do not smoke without supervision.  Keep all follow-up visits as told by your health care provider. This is important. Contact a health care provider if:  You have nausea or  vomiting that does not get better with medicine.  You cannot eat or drink without vomiting.  You have pain that does not get better with medicine.  You are unable to pass urine.  You develop a skin rash.  You have a fever.  You have redness around your IV site that gets worse. Get help right away if:  You have difficulty breathing.  You have chest pain.  You have blood in your urine or stool, or you vomit blood. Summary  After the procedure, it is common to have a sore throat or nausea. It is also common to feel tired.  Have a responsible adult stay with you for the time you are told. It is important to have someone help care for you until you are awake and alert.  When you feel hungry, start by eating small amounts of foods that are soft and easy to digest (bland), such as toast. Gradually return to your regular diet.  Drink enough fluid to keep your urine pale yellow.  Return to your normal activities as told by your health care  provider. Ask your health care provider what activities are safe for you. This information is not intended to replace advice given to you by your health care provider. Make sure you discuss any questions you have with your health care provider. Document Revised: 07/09/2020 Document Reviewed: 02/06/2020 Elsevier Patient Education  2021 [employer].

## 2021-03-18 ENCOUNTER — Ambulatory Visit: Payer: Medicaid Other | Admitting: Family Medicine

## 2021-03-18 ENCOUNTER — Other Ambulatory Visit: Payer: Self-pay

## 2021-03-18 ENCOUNTER — Telehealth (INDEPENDENT_AMBULATORY_CARE_PROVIDER_SITE_OTHER): Payer: Medicaid Other | Admitting: General Surgery

## 2021-03-18 ENCOUNTER — Encounter: Payer: Self-pay | Admitting: Family Medicine

## 2021-03-18 VITALS — BP 120/77 | HR 67 | Temp 97.7°F | Ht 69.0 in | Wt 202.8 lb

## 2021-03-18 DIAGNOSIS — B370 Candidal stomatitis: Secondary | ICD-10-CM | POA: Diagnosis not present

## 2021-03-18 DIAGNOSIS — N6452 Nipple discharge: Secondary | ICD-10-CM

## 2021-03-18 LAB — SURGICAL PATHOLOGY

## 2021-03-18 MED ORDER — FLUCONAZOLE 100 MG PO TABS: ORAL_TABLET | ORAL | 0 refills | Status: AC

## 2021-03-18 NOTE — Progress Notes (Signed)
Assessment & Plan:  1. Oral candidiasis Advised to follow back up with ENT.  - Hepatitis panel, acute - HIV antibody (with reflex) - RPR - fluconazole (DIFLUCAN) 100 MG tablet; Take 2 tablets (200 mg total) by mouth daily for 1 day, THEN 1 tablet (100 mg total) daily for 6 days.  Dispense: 8 tablet; Refill: 0   Follow up plan: Return if symptoms worsen or fail to improve.  Hendricks Limes, MSN, APRN, FNP-C [facility]  Subjective:   Patient ID: Kaitlin Torres, female    DOB: 03/15/1981, 40 y.o.   MRN: [id number]  HPI: Kaitlin Torres is a 40 y.o. female presenting on 03/18/2021 for Thrush (X 1 year on and off )  Patient reports thrush started coming back 10 days ago. She has been using magic mouthwash and Nystatin twice daily. The magic mouthwash was prescribed her the ENT. She had the Nystatin leftover. This has been recurrent since she had COVID-19 a year ago. She does not have diabetes. She is not receiving chemotherapy or radiation. She had not been on any recent antibiotics or steroids. She does vape. Does not wear dentures.    ROS: Negative unless specifically indicated above in HPI.   Relevant past medical history reviewed and updated as indicated.   Allergies and medications reviewed and updated.   Current Outpatient Medications:  .  aspirin EC 81 MG tablet, Take 81 mg by mouth daily. Swallow whole., Disp: , Rfl:  .  atorvastatin (LIPITOR) 80 MG tablet, Take 1 tablet (80 mg total) by mouth daily., Disp: 90 tablet, Rfl: 3 .  labetalol (NORMODYNE) 100 MG tablet, Take 100 mg by mouth 2 (two) times daily., Disp: , Rfl:  .  ondansetron (ZOFRAN) 4 MG tablet, Take 1 tablet (4 mg total) by mouth every 8 (eight) hours as needed., Disp: 30 tablet, Rfl: 1 .  oxyCODONE (ROXICODONE) 5 MG immediate release tablet, Take 1 tablet (5 mg total) by mouth every 4 (four) hours as needed for severe pain or breakthrough pain., Disp: 10 tablet, Rfl: 0 .  Vitamin D,  Ergocalciferol, (DRISDOL) 1.25 MG (50000 UNIT) CAPS capsule, Take 50,000 Units by mouth every Monday., Disp: , Rfl:   No Known Allergies  Objective:   BP 120/77   Pulse 67   Temp 97.7 F (36.5 C) (Temporal)   Ht 5\' 9"  (1.753 m)   Wt 202 lb 12.8 oz (92 kg)   LMP 02/26/2021 (Exact Date)   SpO2 98%   BMI 29.95 kg/m    Physical Exam Vitals reviewed.  Constitutional:      General: She is not in acute distress.    Appearance: Normal appearance. She is not ill-appearing, toxic-appearing or diaphoretic.  HENT:     Head: Normocephalic and atraumatic.     Mouth/Throat:     Comments: Tongue with very mild white coating. Eyes:     General: No scleral icterus.       Right eye: No discharge.        Left eye: No discharge.     Conjunctiva/sclera: Conjunctivae normal.  Cardiovascular:     Rate and Rhythm: Normal rate.  Pulmonary:     Effort: Pulmonary effort is normal. No respiratory distress.  Musculoskeletal:        General: Normal range of motion.     Cervical back: Normal range of motion.  Skin:    General: Skin is warm and dry.     Capillary Refill: Capillary refill takes less  than 2 seconds.  Neurological:     General: No focal deficit present.     Mental Status: She is alert and oriented to person, place, and time. Mental status is at baseline.  Psychiatric:        Mood and Affect: Mood normal.        Behavior: Behavior normal.        Thought Content: Thought content normal.        Judgment: Judgment normal.

## 2021-03-18 NOTE — Telephone Encounter (Signed)
[facility]  Updated patient on pathology.   FINAL MICROSCOPIC DIAGNOSIS:   A. 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   Curlene Labrum, MD [facility] [address] [place], [place] [address] [phone] (office)

## 2021-03-19 ENCOUNTER — Ambulatory Visit: Payer: Medicaid Other | Admitting: Family Medicine

## 2021-03-23 LAB — ACUTE VIRAL HEPATITIS (HAV, HBV, HCV)
HCV Ab: <0.1 s/co ratio (ref 0.0–0.9)
Hep A IgM: NEGATIVE
Hep B C IgM: NEGATIVE
Hepatitis B Surface Ag: NEGATIVE

## 2021-03-23 LAB — HCV INTERPRETATION

## 2021-03-23 LAB — HIV ANTIBODY (ROUTINE TESTING W REFLEX): HIV Screen 4th Generation wRfx: NONREACTIVE

## 2021-03-23 LAB — RPR: RPR Ser Ql: NONREACTIVE

## 2021-04-01 ENCOUNTER — Encounter: Payer: Medicaid Other | Admitting: General Surgery

## 2021-06-28 ENCOUNTER — Ambulatory Visit: Payer: Medicaid Other | Admitting: Internal Medicine

## 2021-07-06 NOTE — Progress Notes (Signed)
Cardiology Office Note:    Date:  07/07/2021   ID:  Kaitlin Torres, DOB [birth year], MRN [id number]  PCP:  Loman Brooklyn, FNP Referring MD: Loman Brooklyn, [facility]  Cardiologist:  Elouise Munroe, MD   Reason for visit: 6 month follow-up  History of Present Illness:    Kaitlin Torres is a 40 y.o. female with a hx of premature onset CAD, probable familial heterozygous hypercholesterolemia, fam hx of CAD (father had MI in his 26s).  She had a NSTEMI with stent x 2 to RCA and circ system 05/08/2019.  Her angina was described as "an elephant sitting on my chest and would not get off".  At her visit with Dr. Margaretann Loveless in 03/2020, she mentioned she was off statins and losartan given plans for pregnancy.    Today, he comes in with her partner.  She denies chest heaviness or shortness of breath.  She denies syncope, orthopnea, PND or significant pedal edema. She states she has gained weight.  She mentions she was 197 pounds in May and is now up to 221 pounds.  She blames this on a new relationship.  She is no longer going to the gym and has worse dietary habits.  She is still using a vape device.  Her partner smokes cigarettes.  She notes episodes of hand numbness.  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   Cough    Generalized headaches    Heart attack ([place]) 04/2019   Hyperlipidemia    Hypertension    PCOS (polycystic ovarian syndrome)    Rectal pain    Vitamin D deficiency 01/15/2021    Past Surgical History:  Procedure Laterality Date   ANKLE SURGERY  july 2005   screws placed in right ankle    BREAST DUCTAL SYSTEM EXCISION Right 03/17/2021   Procedure: RIGHT BREAST DUCTAL EXCISION;  Surgeon: Virl Cagey, MD;  Location: [facility];  Service: General;  Laterality: Right;   CORONARY STENT INTERVENTION N/A 05/08/2019   Procedure: CORONARY STENT  INTERVENTION;  Surgeon: Martinique, Peter M, MD;  Location: [facility];  Service: Cardiovascular;  Laterality: N/A;   FRACTURE SURGERY  2005   right ankle   HEMORRHOID SURGERY     HEMORRHOID SURGERY  2015   INCISION AND DRAINAGE BREAST ABSCESS  november 2011   left breast    LEFT HEART CATH AND CORONARY ANGIOGRAPHY N/A 05/08/2019   Procedure: LEFT HEART CATH AND CORONARY ANGIOGRAPHY;  Surgeon: Martinique, Peter M, MD;  Location: [facility];  Service: Cardiovascular;  Laterality: N/A;    Current Medications: Current Meds  Medication Sig   [DISCONTINUED] aspirin EC 81 MG tablet Take 81 mg by mouth daily. Swallow whole.   [DISCONTINUED] atorvastatin (LIPITOR) 80 MG tablet Take 1 tablet (80 mg total) by mouth daily.   [DISCONTINUED] labetalol (NORMODYNE) 100 MG tablet Take 100 mg by mouth 2 (two) times daily.     Allergies:   Patient has no known allergies.   Social History   Socioeconomic History   Marital status: Married    Spouse name: Not on file   Number of children: 3   Years of education: 14   Highest education level: Associate degree: academic program  Occupational History   Occupation: Waitress  Tobacco Use   Smoking  status: Former    Packs/day: 0.10    Years: 26.00    Pack years: 2.60    Types: Cigarettes    Quit date: 09/16/2019    Years since quitting: 1.8   Smokeless tobacco: Never  Vaping Use   Vaping Use: Every day   Substances: Nicotine  Substance and Sexual Activity   Alcohol use: No    Comment: quit 2018   Drug use: No   Sexual activity: Yes    Birth control/protection: None    Comment: married, 3 kids, vaginal, trying for another  Other Topics Concern   Not on file  Social History Narrative   Lives at home with her mother, husband and three daughters.   Right-handed.   Approximately 20 cups caffeine per day (4-5 glasses holding 32oz of tea).   Social Determinants of Health   Financial Resource Strain: Not on file  Food Insecurity: Not on  file  Transportation Needs: Not on file  Physical Activity: Not on file  Stress: Not on file  Social Connections: Not on file     Family History: The patient's family history includes Aneurysm in her brother; Cervical cancer in her maternal grandmother; Diabetes in her father and paternal grandmother; Heart disease in her father; Hyperlipidemia in her father; Hypertension in her mother; Stroke in her father.  ROS:   Please see the history of present illness.     EKGs/Labs/Other Studies Reviewed:     Recent Labs: 01/13/2021: ALT 14; BUN 9; Creatinine, Ser 0.73; Hemoglobin 11.0; Platelets 464; Potassium 4.6; Sodium 137; TSH 0.169  Recent Lipid Panel    Component Value Date/Time   CHOL 221 (H) 02/09/2021 1659   TRIG 91 02/09/2021 1659   HDL 35 (L) 02/09/2021 1659   CHOLHDL 6.3 (H) 02/09/2021 1659   CHOLHDL 10.0 05/09/2019 0357   VLDL 47 (H) 05/09/2019 0357   LDLCALC 170 (H) 02/09/2021 1659    Physical Exam:    VS:  BP 122/78   Pulse 72   Ht '5\' 9"'$  (1.753 m)   Wt 221 lb 6.4 oz (100.4 kg)   SpO2 98%   BMI 32.70 kg/m     Wt Readings from Last 3 Encounters:  07/07/21 221 lb 6.4 oz (100.4 kg)  03/18/21 202 lb 12.8 oz (92 kg)  03/15/21 198 lb (89.8 kg)     GEN:  Well nourished, well developed in no acute distress, overweight HEENT: Normal NECK: No JVD; No carotid bruits CARDIAC: RRR, no murmurs, rubs, gallops RESPIRATORY:  Clear to auscultation without rales, wheezing or rhonchi  ABDOMEN: Soft, non-tender, non-distended MUSCULOSKELETAL: No edema; No deformity  SKIN: Warm and dry NEUROLOGIC:  Alert and oriented PSYCHIATRIC:  Normal affect   ASSESSMENT AND PLAN   1. Coronary artery disease involving native coronary artery of native heart without angina pectoris -No angina -Continue statin and aspirin therapy. -Discussed the importance of better cholesterol control, weight loss and nicotine cessation.  2. Heterozygous familial hypercholesterolemia - Repeat fasting  lipids since she has been back on her statin for >2 months.  She prefers getting this done at her PCP's office since she lives in [place]. - Her LDL was 170 in April 2022 off statin. - If her LDL is significantly higher than 70, we should consider adding Zetia or Repatha. - Discussed cholesterol lowering diets - Mediterranean diet, DASH diet, vegetarian diet, low-carbohydrate diet and avoidance of trans fats.  Discussed healthier choice substitutes.  Nuts, high-fiber foods, and fiber supplements may also improve lipids.  3. Obesity - Discussed how even a 5-10% weight loss can have cardiovascular benefits.   - Recommend moderate intensity activity for 30 minutes 5 days/week and the DASH diet.  4. Vaping/Nicotine use & secondhand smoke exposure - Recommend tobacco cessation.  Reviewed physiologic effects of nicotine and the immediate-eventual benefits of quitting including improvement in cough/breathing and reduction in cardiovascular events.  Discussed quitting tips such as removing triggers and getting support from family/friends and [facility]uit[facility] [place]. - Her partner seems committed to avoid smoking around the patient.  5.  Essential hypertension Well-controlled.  Continue labetalol.  6.  Hand numbness  -Gave her patient handout on carpal tunnel.  If she thinks that she has the symptoms, recommend she discuss with her primary care.   Disposition - Follow-up in 1 year.  I will look for her lipid results when completed.      Medication Adjustments/Labs and Tests Ordered: Current medicines are reviewed at length with the patient today.  Concerns regarding medicines are outlined above.  Orders Placed This Encounter  Procedures   Lipid panel   Meds ordered this encounter  Medications   atorvastatin (LIPITOR) 80 MG tablet    Sig: Take 1 tablet (80 mg total) by mouth daily.    Dispense:  90 tablet    Refill:  3   labetalol (NORMODYNE) 100 MG tablet    Sig: Take 1 tablet (100 mg total) by  mouth 2 (two) times daily.    Dispense:  180 tablet    Refill:  2   aspirin EC 81 MG tablet    Sig: Take 1 tablet (81 mg total) by mouth daily. Swallow whole.    Dispense:  30 tablet    Refill:  11    Patient Instructions  Medication Instructions:  No Changes *If you need a refill on your cardiac medications before your next appointment, please call your pharmacy*   Lab Work: Lipid If you have labs (blood work) drawn today and your tests are completely normal, you will receive your results only by: [place] (if you have MyChart) OR A paper copy in the mail If you have any lab test that is abnormal or we need to change your treatment, we will call you to review the results.   Testing/Procedures: No Testing   Follow-Up: At [facility], you and your health needs are our priority.  As part of our continuing mission to provide you with exceptional heart care, we have created designated Provider Care Teams.  These Care Teams include your primary Cardiologist (physician) and Advanced Practice Providers (APPs -  Physician Assistants and Nurse Practitioners) who all work together to provide you with the care you need, when you need it.  We recommend signing up for the patient portal called "MyChart".  Sign up information is provided on this After Visit Summary.  MyChart is used to connect with patients for Virtual Visits (Telemedicine).  Patients are able to view lab/test results, encounter notes, upcoming appointments, etc.  Non-urgent messages can be sent to your provider as well.   To learn more about what you can do with MyChart, go to [email or web address].    Your next appointment:   1 year(s)  The format for your next appointment:   In Person  Provider:   Cherlynn Kaiser, MD   Other Instructions Smoking Tobacco Information, Adult Smoking tobacco can be harmful to your health. Tobacco contains a poisonous (toxic), colorless chemical called nicotine. Nicotine is  addictive. It changes the brain and  can make it hard to stop smoking. Tobacco also has other toxic chemicals that can hurt your body and raise your risk of many cancers. How can smoking tobacco affect me? Smoking tobacco puts you at risk for: Cancer. Smoking is most commonly associated with lung cancer, but can also lead to cancer in other parts of the body. Chronic obstructive pulmonary disease (COPD). This is a long-term lung condition that makes it hard to breathe. It also gets worse over time. High blood pressure (hypertension), heart disease, stroke, or heart attack. Lung infections, such as pneumonia. Cataracts. This is when the lenses in the eyes become clouded. Digestive problems. This may include peptic ulcers, heartburn, and gastroesophageal reflux disease (GERD). Oral health problems, such as gum disease and tooth loss. Loss of taste and smell. Smoking can affect your appearance by causing: Wrinkles. Yellow or stained teeth, fingers, and fingernails. Smoking tobacco can also affect your social life, because: It may be challenging to find places to smoke when away from home. Many workplaces, [employer], hotels, and public places are tobacco-free. Smoking is expensive. This is due to the cost of tobacco and the long-term costs of treating health problems from smoking. Secondhand smoke may affect those around you. Secondhand smoke can cause lung cancer, breathing problems, and heart disease. Children of smokers have a higher risk for: Sudden infant death syndrome (SIDS). Ear infections. Lung infections. If you currently smoke tobacco, quitting now can help you: Lead a longer and healthier life. Look, smell, breathe, and feel better over time. Save money. Protect others from the harms of secondhand smoke. What actions can I take to prevent health problems? Quit smoking  Do not start smoking. Quit if you already do. Make a plan to quit smoking and commit to it. Look for programs  to help you and ask your health care provider for recommendations and ideas. Set a date and write down all the reasons you want to quit. Let your friends and family know you are quitting so they can help and support you. Consider finding friends who also want to quit. It can be easier to quit with someone else, so that you can support each other. Talk with your health care provider about using nicotine replacement medicines to help you quit, such as gum, lozenges, patches, sprays, or pills. Do not replace cigarette smoking with electronic cigarettes, which are commonly called e-cigarettes. The safety of e-cigarettes is not known, and some may contain harmful chemicals. If you try to quit but return to smoking, stay positive. It is common to slip up when you first quit, so take it one day at a time. Be prepared for cravings. When you feel the urge to smoke, chew gum or suck on hard candy. Lifestyle Stay busy and take care of your body. Drink enough fluid to keep your urine pale yellow. Get plenty of exercise and eat a healthy diet. This can help prevent weight gain after quitting. Monitor your eating habits. Quitting smoking can cause you to have a larger appetite than when you smoke. Find ways to relax. Go out with friends or family to a movie or a restaurant where people do not smoke. Ask your health care provider about having regular tests (screenings) to check for cancer. This may include blood tests, imaging tests, and other tests. Find ways to manage your stress, such as meditation, yoga, or exercise. Where to find support To get support to quit smoking, consider: Asking your health care provider for more information and resources.  Taking classes to learn more about quitting smoking. Looking for local organizations that offer resources about quitting smoking. Joining a support group for people who want to quit smoking in your local community. Calling the smokefree.gov counselor helpline:  1-800-Quit-Now (419)578-3526) Where to find more information You may find more information about quitting smoking from: HelpGuide.org: www.helpguide.org https://hall.com/: smokefree.gov American Lung Association: www.lung.org Contact a health care provider if you: Have problems breathing. Notice that your lips, nose, or fingers turn blue. Have chest pain. Are coughing up blood. Feel faint or you pass out. Have other health changes that cause you to worry. Summary Smoking tobacco can negatively affect your health, the health of those around you, your finances, and your social life. Do not start smoking. Quit if you already do. If you need help quitting, ask your health care provider. Think about joining a support group for people who want to quit smoking in your local community. There are many effective programs that will help you to quit this behavior. This information is not intended to replace advice given to you by your health care provider. Make sure you discuss any questions you have with your health care provider. Document Revised: 01/13/2021 Document Reviewed: 09/15/2020 Elsevier Patient Education  2022 [place], Warren Lacy, Hershal Coria  07/07/2021 1:32 PM    [place]

## 2021-07-07 ENCOUNTER — Encounter: Payer: Self-pay | Admitting: Physician Assistant

## 2021-07-07 ENCOUNTER — Telehealth: Payer: Self-pay

## 2021-07-07 ENCOUNTER — Ambulatory Visit: Payer: Medicaid Other | Admitting: Physician Assistant

## 2021-07-07 ENCOUNTER — Other Ambulatory Visit: Payer: Self-pay

## 2021-07-07 VITALS — BP 122/78 | HR 72 | Ht 69.0 in | Wt 221.4 lb

## 2021-07-07 DIAGNOSIS — E7801 Familial hypercholesterolemia: Secondary | ICD-10-CM

## 2021-07-07 DIAGNOSIS — I2510 Atherosclerotic heart disease of native coronary artery without angina pectoris: Secondary | ICD-10-CM | POA: Diagnosis not present

## 2021-07-07 DIAGNOSIS — I10 Essential (primary) hypertension: Secondary | ICD-10-CM

## 2021-07-07 DIAGNOSIS — E782 Mixed hyperlipidemia: Secondary | ICD-10-CM | POA: Diagnosis not present

## 2021-07-07 DIAGNOSIS — E669 Obesity, unspecified: Secondary | ICD-10-CM | POA: Diagnosis not present

## 2021-07-07 DIAGNOSIS — I252 Old myocardial infarction: Secondary | ICD-10-CM

## 2021-07-07 DIAGNOSIS — Z720 Tobacco use: Secondary | ICD-10-CM | POA: Diagnosis not present

## 2021-07-07 MED ORDER — ASPIRIN EC 81 MG PO TBEC: 81.0000 mg | DELAYED_RELEASE_TABLET | Freq: Every day | ORAL | 11 refills | Status: DC

## 2021-07-07 MED ORDER — LABETALOL HCL 100 MG PO TABS: 100.0000 mg | ORAL_TABLET | Freq: Two times a day (BID) | ORAL | 2 refills | Status: DC

## 2021-07-07 MED ORDER — ATORVASTATIN CALCIUM 80 MG PO TABS: 80.0000 mg | ORAL_TABLET | Freq: Every day | ORAL | 3 refills | Status: DC

## 2021-07-07 NOTE — Telephone Encounter (Addendum)
Spoke with patient in regards to referral to PREP. Will mail information to patient .----- Message from Warren Lacy, PA-C sent at 07/07/2021  1:27 PM EDT ----- Regarding: Can we give Ms. Bessie information about PREP? Can we give Ms. Gabriana information about PREP?  I forgot to mention it to her and it would be great if she can fit it into her schedule.  This is a great deal money wise too. Thank you! Danise Mina  PREP is at [employer] the [employer] and will begin in [place] in October. The PREP program is a 12 week exercise and lifestyle management program led by an RN who works out the [employer].  This RN is employed by [facility]- however ALL patients are welcome in this program.  [facility] will cover the $100 cost of the program for [facility] patients (we will also find money for non-[facility] patients when necessary).  If you have a patient that needs exercise, nutrition, lifestyle change or just socialization- PLEASE REFER!   Easiest way to refer is to email  [email or web address] or call any of the numbers on the flyer.

## 2021-07-07 NOTE — Patient Instructions (Signed)
Medication Instructions:  No Changes *If you need a refill on your cardiac medications before your next appointment, please call your pharmacy*   Lab Work: Lipid If you have labs (blood work) drawn today and your tests are completely normal, you will receive your results only by: [place] (if you have MyChart) OR A paper copy in the mail If you have any lab test that is abnormal or we need to change your treatment, we will call you to review the results.   Testing/Procedures: No Testing   Follow-Up: At [facility], you and your health needs are our priority.  As part of our continuing mission to provide you with exceptional heart care, we have created designated Provider Care Teams.  These Care Teams include your primary Cardiologist (physician) and Advanced Practice Providers (APPs -  Physician Assistants and Nurse Practitioners) who all work together to provide you with the care you need, when you need it.  We recommend signing up for the patient portal called "MyChart".  Sign up information is provided on this After Visit Summary.  MyChart is used to connect with patients for Virtual Visits (Telemedicine).  Patients are able to view lab/test results, encounter notes, upcoming appointments, etc.  Non-urgent messages can be sent to your provider as well.   To learn more about what you can do with MyChart, go to [email or web address].    Your next appointment:   1 year(s)  The format for your next appointment:   In Person  Provider:   Cherlynn Kaiser, MD   Other Instructions Smoking Tobacco Information, Adult Smoking tobacco can be harmful to your health. Tobacco contains a poisonous (toxic), colorless chemical called nicotine. Nicotine is addictive. It changes the brain and can make it hard to stop smoking. Tobacco also has other toxic chemicals that can hurt your body and raise your risk of many cancers. How can smoking tobacco affect me? Smoking tobacco puts you at  risk for: Cancer. Smoking is most commonly associated with lung cancer, but can also lead to cancer in other parts of the body. Chronic obstructive pulmonary disease (COPD). This is a long-term lung condition that makes it hard to breathe. It also gets worse over time. High blood pressure (hypertension), heart disease, stroke, or heart attack. Lung infections, such as pneumonia. Cataracts. This is when the lenses in the eyes become clouded. Digestive problems. This may include peptic ulcers, heartburn, and gastroesophageal reflux disease (GERD). Oral health problems, such as gum disease and tooth loss. Loss of taste and smell. Smoking can affect your appearance by causing: Wrinkles. Yellow or stained teeth, fingers, and fingernails. Smoking tobacco can also affect your social life, because: It may be challenging to find places to smoke when away from home. Many workplaces, [employer], hotels, and public places are tobacco-free. Smoking is expensive. This is due to the cost of tobacco and the long-term costs of treating health problems from smoking. Secondhand smoke may affect those around you. Secondhand smoke can cause lung cancer, breathing problems, and heart disease. Children of smokers have a higher risk for: Sudden infant death syndrome (SIDS). Ear infections. Lung infections. If you currently smoke tobacco, quitting now can help you: Lead a longer and healthier life. Look, smell, breathe, and feel better over time. Save money. Protect others from the harms of secondhand smoke. What actions can I take to prevent health problems? Quit smoking  Do not start smoking. Quit if you already do. Make a plan to quit smoking and commit to it.  Look for programs to help you and ask your health care provider for recommendations and ideas. Set a date and write down all the reasons you want to quit. Let your friends and family know you are quitting so they can help and support you. Consider  finding friends who also want to quit. It can be easier to quit with someone else, so that you can support each other. Talk with your health care provider about using nicotine replacement medicines to help you quit, such as gum, lozenges, patches, sprays, or pills. Do not replace cigarette smoking with electronic cigarettes, which are commonly called e-cigarettes. The safety of e-cigarettes is not known, and some may contain harmful chemicals. If you try to quit but return to smoking, stay positive. It is common to slip up when you first quit, so take it one day at a time. Be prepared for cravings. When you feel the urge to smoke, chew gum or suck on hard candy. Lifestyle Stay busy and take care of your body. Drink enough fluid to keep your urine pale yellow. Get plenty of exercise and eat a healthy diet. This can help prevent weight gain after quitting. Monitor your eating habits. Quitting smoking can cause you to have a larger appetite than when you smoke. Find ways to relax. Go out with friends or family to a movie or a restaurant where people do not smoke. Ask your health care provider about having regular tests (screenings) to check for cancer. This may include blood tests, imaging tests, and other tests. Find ways to manage your stress, such as meditation, yoga, or exercise. Where to find support To get support to quit smoking, consider: Asking your health care provider for more information and resources. Taking classes to learn more about quitting smoking. Looking for local organizations that offer resources about quitting smoking. Joining a support group for people who want to quit smoking in your local community. Calling the smokefree.gov counselor helpline: 1-800-Quit-Now (725) 008-3974) Where to find more information You may find more information about quitting smoking from: HelpGuide.org: www.helpguide.org https://hall.com/: smokefree.gov American Lung Association:  www.lung.org Contact a health care provider if you: Have problems breathing. Notice that your lips, nose, or fingers turn blue. Have chest pain. Are coughing up blood. Feel faint or you pass out. Have other health changes that cause you to worry. Summary Smoking tobacco can negatively affect your health, the health of those around you, your finances, and your social life. Do not start smoking. Quit if you already do. If you need help quitting, ask your health care provider. Think about joining a support group for people who want to quit smoking in your local community. There are many effective programs that will help you to quit this behavior. This information is not intended to replace advice given to you by your health care provider. Make sure you discuss any questions you have with your health care provider. Document Revised: 01/13/2021 Document Reviewed: 09/15/2020 Elsevier Patient Education  2022 [employer].

## 2021-08-17 ENCOUNTER — Encounter: Payer: Self-pay | Admitting: Family Medicine

## 2021-08-17 ENCOUNTER — Other Ambulatory Visit: Payer: Self-pay

## 2021-08-17 ENCOUNTER — Ambulatory Visit: Payer: Medicaid Other | Admitting: Family Medicine

## 2021-08-17 VITALS — BP 139/89 | HR 71 | Temp 97.2°F | Ht 69.0 in | Wt 221.4 lb

## 2021-08-17 DIAGNOSIS — D509 Iron deficiency anemia, unspecified: Secondary | ICD-10-CM | POA: Diagnosis not present

## 2021-08-17 DIAGNOSIS — I252 Old myocardial infarction: Secondary | ICD-10-CM | POA: Diagnosis not present

## 2021-08-17 DIAGNOSIS — R202 Paresthesia of skin: Secondary | ICD-10-CM | POA: Diagnosis not present

## 2021-08-17 DIAGNOSIS — R200 Anesthesia of skin: Secondary | ICD-10-CM

## 2021-08-17 DIAGNOSIS — I10 Essential (primary) hypertension: Secondary | ICD-10-CM | POA: Diagnosis not present

## 2021-08-17 DIAGNOSIS — E782 Mixed hyperlipidemia: Secondary | ICD-10-CM | POA: Diagnosis not present

## 2021-08-17 DIAGNOSIS — E559 Vitamin D deficiency, unspecified: Secondary | ICD-10-CM | POA: Diagnosis not present

## 2021-08-17 NOTE — Progress Notes (Signed)
Assessment & Plan:  1. Essential hypertension Elevated today, but patient has not had her medication yet. - TSH; Future - Lipid panel; Future - CBC with Differential/Platelet; Future - CMP14+EGFR; Future  2. History of non-ST elevation myocardial infarction (NSTEMI) Continue beta-blocker and statin. - Lipid panel; Future  3. Mixed hyperlipidemia Continue statin. Consider Zetia or Repatha is LDL >70. - Lipid panel; Future - CMP14+EGFR; Future  4. Numbness and tingling in both hands Education provided on carpal tunnel syndrome. - TSH; Future - CMP14+EGFR; Future - Ambulatory referral to Orthopedic Surgery  5. Vitamin D deficiency - VITAMIN D 25 Hydroxy (Vit-D Deficiency, Fractures); Future  6. Iron deficiency anemia, unspecified iron deficiency anemia type - CBC with Differential/Platelet; Future   Patient will return for lab work so that she is fasting.   Return in about 6 months (around 02/15/2022) for annual physical.  Hendricks Limes, MSN, APRN, FNP-C [name]  Subjective:    Patient ID: Kaitlin Torres, female    DOB: 02/10/1981, 40 y.o.   MRN: [id number]  Patient Care Team: Loman Brooklyn, FNP as PCP - General (Family Medicine) Elouise Munroe, MD as PCP - Cardiology (Cardiology)   Chief Complaint:  Chief Complaint  Patient presents with   hand numbness    Patient states that she has been having bilateral hand numbness for year that has gotten worse. Right hand is worse    Medical Management of Chronic Issues    HPI: Kaitlin Torres is a 40 y.o. female presenting on 08/17/2021 for hand numbness (Patient states that she has been having bilateral hand numbness for year that has gotten worse. Right hand is worse ) and Medical Management of Chronic Issues  Hypertension: patient has not yet had her medication today (labetalol).  History of NSTEMI: takes a beta-blocker and statin daily.   Hyperlipidemia: taking Atorvastatin 80  mg. Cardiology recommended Zetia or Repatha if LDL remains significantly higher than her goal of 70.   New complaints: Patient reports bilateral hand numbness that has been going on for years. Right is worse than the left. She is dropping things. 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 She has tried wearing wrist splints when she can, but can't when she is at work. They have not been helpful for her.   Social history:  Relevant past medical, surgical, family and social history reviewed and updated as indicated. Interim medical history since our last visit reviewed.  Allergies and medications reviewed and updated.  DATA REVIEWED: CHART IN EPIC  ROS: Negative unless specifically indicated above in HPI.    Current Outpatient Medications:    aspirin EC 81 MG tablet, Take 1 tablet (81 mg total) by mouth daily. 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   Cough    Generalized headaches    Heart attack ([place]) 04/2019   Hyperlipidemia    Hypertension    PCOS (polycystic ovarian syndrome)    Rectal pain    Vitamin D deficiency 01/15/2021    Past Surgical History:  Procedure Laterality Date   ANKLE SURGERY  july 2005  screws placed in right ankle    BREAST DUCTAL SYSTEM EXCISION Right 03/17/2021   Procedure: RIGHT BREAST DUCTAL EXCISION;  Surgeon: Virl Cagey, MD;  Location: [facility];  Service: General;  Laterality: Right;   CORONARY STENT INTERVENTION N/A 05/08/2019   Procedure: CORONARY STENT INTERVENTION;  Surgeon: Martinique, Peter M, MD;  Location: [facility];  Service: Cardiovascular;  Laterality: N/A;   FRACTURE SURGERY  2005   right  ankle   HEMORRHOID SURGERY     HEMORRHOID SURGERY  2015   INCISION AND DRAINAGE BREAST ABSCESS  november 2011   left breast    LEFT HEART CATH AND CORONARY ANGIOGRAPHY N/A 05/08/2019   Procedure: LEFT HEART CATH AND CORONARY ANGIOGRAPHY;  Surgeon: Martinique, Peter M, MD;  Location: [facility];  Service: Cardiovascular;  Laterality: N/A;    Social History   Socioeconomic History   Marital status: Married    Spouse name: Not on file   Number of children: 3   Years of education: 14   Highest education level: Associate degree: academic program  Occupational History   Occupation: Waitress  Tobacco Use   Smoking status: Former    Packs/day: 0.10    Years: 26.00    Pack years: 2.60    Types: Cigarettes    Quit date: 09/16/2019    Years since quitting: 1.9   Smokeless tobacco: Never  Vaping Use   Vaping Use: Every day   Substances: Nicotine  Substance and Sexual Activity   Alcohol use: No    Comment: quit 2018   Drug use: No   Sexual activity: Yes    Birth control/protection: None    Comment: married, 3 kids, vaginal, trying for another  Other Topics Concern   Not on file  Social History Narrative   Lives at home with her mother, husband and three daughters.   Right-handed.   Approximately 20 cups caffeine per day (4-5 glasses holding 32oz of tea).   Social Determinants of Health   Financial Resource Strain: Not on file  Food Insecurity: Not on file  Transportation Needs: Not on file  Physical Activity: Not on file  Stress: Not on file  Social Connections: Not on file  Intimate Partner Violence: Not on file        Objective:    BP 139/89   Pulse 71   Temp (!) 97.2 F (36.2 C) (Temporal)   Ht $R'5\' 9"'ZP$  (1.753 m)   Wt 221 lb 6.4 oz (100.4 kg)   SpO2 100%   BMI 32.70 kg/m   Wt Readings from Last 3 Encounters:  08/17/21 221 lb 6.4 oz (100.4 kg)  07/07/21 221 lb 6.4 oz (100.4 kg)  03/18/21 202 lb 12.8 oz (92 kg)    Physical Exam Vitals reviewed.   Constitutional:      General: She is not in acute distress.    Appearance: Normal appearance. She is obese. She is not ill-appearing, toxic-appearing or diaphoretic.  HENT:     Head: Normocephalic and atraumatic.  Eyes:     General: No scleral icterus.       Right eye: No discharge.        Left eye: No discharge.     Conjunctiva/sclera: Conjunctivae normal.  Cardiovascular:     Rate and Rhythm: Normal rate and regular rhythm.     Heart sounds: Normal heart sounds. No murmur heard.   No friction rub. No gallop.  Pulmonary:     Effort: Pulmonary effort is  normal. No respiratory distress.     Breath sounds: Normal breath sounds. No stridor. No wheezing, rhonchi or rales.  Musculoskeletal:        General: Normal range of motion.     Cervical back: Normal range of motion.     Comments: Negative tinel. Positive phalens.   Skin:    General: Skin is warm and dry.     Capillary Refill: Capillary refill takes less than 2 seconds.  Neurological:     General: No focal deficit present.     Mental Status: She is alert and oriented to person, place, and time. Mental status is at baseline.  Psychiatric:        Mood and Affect: Mood normal.        Behavior: Behavior normal.        Thought Content: Thought content normal.        Judgment: Judgment normal.    Lab Results  Component Value Date   TSH 0.169 (L) 01/13/2021   Lab Results  Component Value Date   WBC 7.0 01/13/2021   HGB 11.0 (L) 01/13/2021   HCT 37.6 01/13/2021   MCV 61 (L) 01/13/2021   PLT 464 (H) 01/13/2021   Lab Results  Component Value Date   NA 137 01/13/2021   K 4.6 01/13/2021   CO2 18 (L) 01/13/2021   GLUCOSE 88 01/13/2021   BUN 9 01/13/2021   CREATININE 0.73 01/13/2021   BILITOT 0.4 01/13/2021   ALKPHOS 66 01/13/2021   AST 17 01/13/2021   ALT 14 01/13/2021   PROT 7.3 01/13/2021   ALBUMIN 4.5 01/13/2021   CALCIUM 9.7 01/13/2021   ANIONGAP 7 05/09/2019   EGFR 108 01/13/2021   Lab Results  Component  Value Date   CHOL 221 (H) 02/09/2021   Lab Results  Component Value Date   HDL 35 (L) 02/09/2021   Lab Results  Component Value Date   LDLCALC 170 (H) 02/09/2021   Lab Results  Component Value Date   TRIG 91 02/09/2021   Lab Results  Component Value Date   CHOLHDL 6.3 (H) 02/09/2021   Lab Results  Component Value Date   HGBA1C 5.4 01/13/2021

## 2021-08-18 ENCOUNTER — Encounter: Payer: Self-pay | Admitting: Family Medicine

## 2021-08-24 ENCOUNTER — Ambulatory Visit: Payer: Medicaid Other | Admitting: Orthopedic Surgery

## 2021-08-30 ENCOUNTER — Encounter: Payer: Self-pay | Admitting: Orthopedic Surgery

## 2021-08-30 ENCOUNTER — Other Ambulatory Visit: Payer: Self-pay

## 2021-08-30 ENCOUNTER — Ambulatory Visit: Payer: Medicaid Other | Admitting: Orthopedic Surgery

## 2021-08-30 DIAGNOSIS — R200 Anesthesia of skin: Secondary | ICD-10-CM | POA: Insufficient documentation

## 2021-08-30 NOTE — Progress Notes (Signed)
Office Visit Note   Patient: Kaitlin Torres           Date of Birth: January 16, 1981           MRN: [id number] Visit Date: 08/30/2021              Requested by: Loman Brooklyn, [place],  [place] [address] PCP: Loman Brooklyn, FNP   Assessment & Plan: Visit Diagnoses:  1. Bilateral hand numbness     Plan: We discussed the nature, prognosis, and treatment options for presumed carpal tunnel syndrome.  She has tried night braces for several months without any symptoms relief.  At this point, we will get a NCS/EMG of both upper extremities to further evaluate her symptoms.  I will see her back once this is done.   Follow-Up Instructions: No follow-ups on file.   Orders:  No orders of the defined types were placed in this encounter.  No orders of the defined types were placed in this encounter.     Procedures: No procedures performed   Clinical Data: No additional findings.   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  This has progressively worsened.  She describes numbness and tingling in all fingers of both hands.  It is worse with driving, writing, holding objects.  She often drops things at work.  She has no symptoms today.      Review of Systems   Objective: Vital Signs: BP 121/81 (BP Location: Left Arm, Patient Position: Sitting, Cuff Size: Normal)   Pulse 80   Resp (!) 80   Ht 5\' 9"  (1.753 m)   Wt 226 lb 12.8 oz (102.9 kg)   SpO2 98%   BMI 33.49 kg/m   Physical Exam Constitutional:      Appearance:  Normal appearance.  Cardiovascular:     Rate and Rhythm: Normal rate.     Pulses: Normal pulses.  Pulmonary:     Effort: Pulmonary effort is normal.  Skin:    General: Skin is warm and dry.     Capillary Refill: Capillary refill takes less than 2 seconds.  Neurological:     Mental Status: She is alert.    Right Hand Exam   Tenderness  The patient is experiencing no tenderness.   Range of Motion  The patient has normal right wrist ROM.   Muscle Strength  The patient has normal right wrist strength.  Other  Erythema: absent Sensation: normal Pulse: present  Comments:  - Tinel at wrist, + Tinel at elbow.  + Phalen sign.  Equivocal CT compression.  4/5 thenar muscle strength with no atrophy.    Left Hand Exam   Tenderness  The patient is experiencing no tenderness.   Range of Motion  The patient has normal left wrist ROM.  Muscle Strength  The patient has normal left wrist strength.  Other  Erythema: absent Sensation: normal Pulse: present  Comments:  - Tinel at wrist, - Tinel at elbow.  + Phalen sign.  Equivocal CT compression.  5/5 thenar  muscle strength with no atrophy.      Specialty Comments:  No specialty comments available.  Imaging: No results found.   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   History of non-ST elevation myocardial infarction (NSTEMI) 05/08/2019   Irregular periods 11/14/2017   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   Cough    Generalized headaches    Heart attack ([place]) 04/2019    Hyperlipidemia    Hypertension    PCOS (polycystic ovarian syndrome)    Rectal pain    Vitamin D deficiency 01/15/2021    Family History  Problem Relation Age of Onset   Hypertension Mother    Heart disease Father        Cardiac arrest 2006   Diabetes Father    Hyperlipidemia Father    Stroke Father    Diabetes Paternal Grandmother    Cervical cancer Maternal Grandmother    Aneurysm Brother     Past Surgical History:  Procedure Laterality Date   ANKLE SURGERY  july 2005   screws placed in right ankle    BREAST DUCTAL SYSTEM EXCISION Right 03/17/2021   Procedure: RIGHT BREAST DUCTAL EXCISION;  Surgeon: Virl Cagey, MD;  Location: [facility];  Service: General;  Laterality: Right;   CORONARY STENT INTERVENTION N/A 05/08/2019   Procedure: CORONARY STENT INTERVENTION;  Surgeon: Martinique, Peter M, MD;  Location: [facility];  Service: Cardiovascular;  Laterality: N/A;   FRACTURE SURGERY  2005   right ankle   HEMORRHOID SURGERY     HEMORRHOID SURGERY  2015   INCISION AND DRAINAGE BREAST ABSCESS  november 2011   left breast    LEFT HEART CATH AND CORONARY ANGIOGRAPHY N/A 05/08/2019   Procedure: LEFT HEART CATH AND CORONARY ANGIOGRAPHY;  Surgeon: Martinique, Peter M, MD;  Location: [facility];  Service: Cardiovascular;  Laterality: N/A;   Social History   Occupational History   Occupation: Waitress  Tobacco Use   Smoking status: Former    Packs/day: 0.10    Years: 26.00    Pack years: 2.60    Types: Cigarettes    Quit date: 09/16/2019    Years since quitting: 1.9   Smokeless tobacco: Never  Vaping Use   Vaping Use: Every day   Substances: Nicotine  Substance and Sexual Activity   Alcohol use: No    Comment: quit 2018   Drug use: No   Sexual activity: Yes    Birth control/protection: None    Comment: married, 3 kids, vaginal, trying for another

## 2021-09-14 ENCOUNTER — Telehealth: Payer: Self-pay | Admitting: Physical Medicine and Rehabilitation

## 2021-09-14 ENCOUNTER — Encounter: Payer: Medicaid Other | Admitting: Physical Medicine and Rehabilitation

## 2021-09-14 NOTE — Telephone Encounter (Signed)
Pt calling to get appt for this morning resch'd. The best call back number is [phone].

## 2021-09-14 NOTE — Telephone Encounter (Signed)
Returned patient's call and rescheduled appointment.

## 2021-10-05 ENCOUNTER — Encounter: Payer: Medicaid Other | Admitting: Physical Medicine and Rehabilitation

## 2021-10-05 ENCOUNTER — Telehealth: Payer: Self-pay | Admitting: Physical Medicine and Rehabilitation

## 2021-10-05 NOTE — Progress Notes (Deleted)
   Kaitlin Torres - 40 y.o. female MRN [id number]  Date of birth: [birth year]  Office Visit Note: Visit Date: 10/05/2021 PCP: Loman Brooklyn, FNP Referred by: Loman Brooklyn, FNP  Subjective: No chief complaint on file.  HPI:  Kaitlin Torres is a 40 y.o. female who comes in today at the request of Dr. Audria Nine for electrodiagnostic study of the Bilateral upper extremities.  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  This has progressively worsened.  She describes numbness and tingling in all fingers of both hands.  It is worse with driving, writing, holding objects.  She often drops things at work.  She has no symptoms today.  ROS Otherwise per HPI.  Assessment & Plan: Visit Diagnoses:    ICD-10-CM   1. Paresthesia of skin  R20.2       Plan: No additional findings.   Meds & Orders: No orders of the defined types were placed in this encounter.  No orders of the defined types were placed in this encounter.   Follow-up: No follow-ups on file.   Procedures: No procedures performed      Clinical History: No specialty comments available.     Objective:  VS:  HT:    WT:   BMI:     BP:   HR: bpm  TEMP: ( )  RESP:  Physical Exam Musculoskeletal:        General: No swelling, tenderness or deformity.     Comments: Inspection reveals no atrophy of the bilateral APB or FDI or hand intrinsics. There is no swelling, color changes, allodynia or dystrophic changes. There is 5 out of 5 strength in the bilateral wrist extension, finger abduction and long finger flexion. There is intact sensation to light touch in all dermatomal and peripheral nerve distributions. There is a negative Froment's test bilaterally. There is a negative Tinel's test at the bilateral wrist and elbow. There is a negative Phalen's test bilaterally. There is a negative Hoffmann's test bilaterally.  Skin:    General: Skin is warm and  dry.     Findings: No erythema or rash.  Neurological:     General: No focal deficit present.     Mental Status: She is alert and oriented to person, place, and time.     Motor: No weakness or abnormal muscle tone.     Coordination: Coordination normal.  Psychiatric:        Mood and Affect: Mood normal.        Behavior: Behavior normal.     Imaging: No results found.

## 2021-10-05 NOTE — Telephone Encounter (Signed)
Pt needs to R/S her nerve conduction she was supposed to have today; she has the flu.   [phone]

## 2021-10-22 ENCOUNTER — Encounter: Payer: Medicaid Other | Admitting: Physical Medicine and Rehabilitation

## 2021-11-30 ENCOUNTER — Telehealth: Payer: Self-pay | Admitting: *Deleted

## 2021-11-30 ENCOUNTER — Ambulatory Visit (INDEPENDENT_AMBULATORY_CARE_PROVIDER_SITE_OTHER): Payer: Medicaid Other | Admitting: Family

## 2021-11-30 ENCOUNTER — Encounter: Payer: Self-pay | Admitting: Family

## 2021-11-30 VITALS — BP 128/87 | HR 76 | Temp 97.7°F | Ht 69.0 in | Wt 223.0 lb

## 2021-11-30 DIAGNOSIS — M7662 Achilles tendinitis, left leg: Secondary | ICD-10-CM

## 2021-11-30 MED ORDER — PREDNISONE 10 MG (21) PO TBPK: ORAL_TABLET | ORAL | 0 refills | Status: DC

## 2021-11-30 MED ORDER — DICLOFENAC SODIUM 75 MG PO TBEC: 75.0000 mg | DELAYED_RELEASE_TABLET | Freq: Two times a day (BID) | ORAL | 0 refills | Status: DC

## 2021-11-30 MED ORDER — MELOXICAM 15 MG PO TABS: 15.0000 mg | ORAL_TABLET | Freq: Every day | ORAL | 0 refills | Status: DC

## 2021-11-30 MED ORDER — DICLOFENAC SODIUM 1 % EX GEL: 2.0000 g | Freq: Four times a day (QID) | CUTANEOUS | 2 refills | Status: DC

## 2021-11-30 NOTE — Telephone Encounter (Signed)
diclofenac topical gel (generic for Voltaren  Gel) IS covered  Diclofenac TAB are not covered by [facility]   Can this be changed ?

## 2021-11-30 NOTE — Progress Notes (Signed)
Subjective:    Patient ID: Kaitlin Torres, female    DOB: Nov 27, 1980, 41 y.o.   MRN: [id number]  Chief Complaint  Patient presents with   Foot Injury    Left mths on going    Pt presents to the office today with left achillis pain that started months ago. Can not remember any injury, but does walk 8-12 hours a day on concrete floors.  Foot Injury  The incident occurred more than 1 week ago. There was no injury mechanism. The pain is present in the left foot. The pain is at a severity of 5/10. The pain is moderate. The pain has been Constant since onset. Pertinent negatives include no loss of motion, loss of sensation, muscle weakness, numbness or tingling. She reports no foreign bodies present. The symptoms are aggravated by weight bearing and movement. She has tried non-weight bearing, NSAIDs, acetaminophen, heat, ice, elevation and immobilization for the symptoms. The treatment provided mild relief.     Review of Systems  Neurological:  Negative for tingling and numbness.      Objective:   Physical Exam Vitals reviewed.  Constitutional:      General: She is not in acute distress.    Appearance: She is well-developed.  HENT:     Head: Normocephalic and atraumatic.  Eyes:     Pupils: Pupils are equal, round, and reactive to light.  Neck:     Thyroid: No thyromegaly.  Cardiovascular:     Rate and Rhythm: Normal rate and regular rhythm.     Heart sounds: Normal heart sounds. No murmur heard. Pulmonary:     Effort: Pulmonary effort is normal. No respiratory distress.     Breath sounds: Normal breath sounds. No wheezing.  Abdominal:     General: Bowel sounds are normal. There is no distension.     Palpations: Abdomen is soft.     Tenderness: There is no abdominal tenderness.  Musculoskeletal:        General: No tenderness. Normal range of motion.     Cervical back: Normal range of motion and neck supple.     Comments: Full ROM of left ankle, pain in achillis with extension   Skin:    General: Skin is warm and dry.  Neurological:     Mental Status: She is alert and oriented to person, place, and time.     Cranial Nerves: No cranial nerve deficit.     Deep Tendon Reflexes: Reflexes are normal and symmetric.  Psychiatric:        Behavior: Behavior normal.        Thought Content: Thought content normal.        Judgment: Judgment normal.    BP 128/87    Pulse 76    Temp 97.7 F (36.5 C) (Temporal)    Ht 5\' 9"  (1.753 m)    Wt 223 lb (101.2 kg)    BMI 32.93 kg/m        Assessment & Plan:  Kaitlin Torres comes in today with chief complaint of Foot Injury (Left mths on going )   Diagnosis and orders addressed:  1. Achilles tendinitis of left lower extremity Rest Wear good shoe support No other NSAID's while taking diclofenac  Avoid high impact activities  Follow up if symptoms worsen or do not improve - diclofenac (VOLTAREN) 75 MG EC tablet; Take 1 tablet (75 mg total) by mouth 2 (two) times daily.  Dispense: 30 tablet; Refill: 0 - predniSONE (STERAPRED UNI-PAK 21  TAB) 10 MG (21) TBPK tablet; Use as directed  Dispense: 21 tablet; Refill: 0   Evelina Dun, FNP

## 2021-11-30 NOTE — Patient Instructions (Signed)
Achilles Tendinitis Achilles tendinitis is inflammation of the tough, cord-like band that attaches the lower leg muscles to the heel bone (Achilles tendon). This is usually caused by overusing the tendon and the ankle joint. Achilles tendinitis usually gets better over time with treatment and caring for yourself at home. It can take weeks or months to heal completely. What are the causes? This condition may be caused by: A sudden increase in exercise or activity, such as running. Doing the same exercises or activities, such as jumping, over and over. Not warming up calf muscles before exercising. Exercising in shoes that are worn out or not made for exercise. Having arthritis or a bone growth (spur) on the back of the heel bone. This can rub against the tendon and hurt it. Age-related wear and tear. Tendons become less flexible with age and are more likely to be injured. What are the signs or symptoms? Common symptoms of this condition include: Pain in the Achilles tendon or in the back of the leg, just above the heel. The pain usually gets worse with exercise. Stiffness or soreness in the back of the leg, especially in the morning. Swelling of the skin over the Achilles tendon. Thickening of the tendon. Trouble standing on tiptoe. How is this diagnosed? This condition is diagnosed based on your symptoms and a physical exam. You may have tests, including: X-rays. MRI. How is this treated? The goal of treatment is to relieve symptoms and help your injury heal. Treatment may include: Decreasing or stopping activities that caused the tendinitis. This may mean switching to low-impact exercises like biking or swimming. Icing the injured area. Doing physical therapy, including strengthening and stretching exercises. Taking NSAIDs, such as ibuprofen, to help relieve pain and swelling. Using supportive shoes, wraps, heel lifts, or a walking boot (air cast). Having surgery. This may be done if  your symptoms do not improve after other treatments. Using high-energy shock wave impulses to stimulate the healing process (extracorporeal shock wave therapy). This is rare. Having an injection of medicines that help relieve inflammation (corticosteroids). This is rare. Follow these instructions at home: If you have an air cast: Wear the air cast as told by your health care provider. Remove it only as told by your health care provider. Loosen it if your toes tingle, become numb, or turn cold and blue. Keep it clean. If the air cast is not waterproof: Do not let it get wet. Cover it with a watertight covering when you take a bath or shower. Managing pain, stiffness, and swelling  If directed, put ice on the injured area. To do this: If you have a removable air cast, remove it as told by your health care provider. Put ice in a plastic bag. Place a towel between your skin and the bag. Leave the ice on for 20 minutes, 2-3 times a day. Move your toes often to reduce stiffness and swelling. Raise (elevate) your foot above the level of your heart while you are sitting or lying down. Activity Gradually return to your normal activities as told by your health care provider. Ask your health care provider what activities are safe for you. Do not do activities that cause pain. Consider doing low-impact exercises, like cycling or swimming. Ask your health care provider when it is safe to drive if you have an air cast on your foot. If physical therapy was prescribed, do exercises as told by your health care provider or physical therapist. General instructions If directed, wrap your foot  with an elastic bandage or other wrap. This can help to keep your tendon from moving too much while it heals. Your health care provider will show you how to wrap your foot correctly. Wear supportive shoes or heel lifts only as told by your health care provider. Take over-the-counter and prescription medicines only as  told by your health care provider. Keep all follow-up visits as told by your health care provider. This is important. Contact a health care provider if you: Have symptoms that get worse. Have pain that does not get better with medicine. Develop new, unexplained symptoms. Develop warmth and swelling in your foot. Have a fever. Get help right away if you: Have a sudden popping sound or sensation in your Achilles tendon followed by severe pain. Cannot move your toes or foot. Cannot put any weight on your foot. Your foot or toes become numb and look white or blue even after loosening your bandage or air cast. Summary Achilles tendinitis is inflammation of the tough, cord-like band that attaches the lower leg muscles to the heel bone (Achilles tendon). This condition is usually caused by overusing the tendon and the ankle joint. It can also be caused by arthritis or normal aging. The most common symptoms of this condition include pain, swelling, or stiffness in the Achilles tendon or in the back of the leg. This condition is usually treated by decreasing or stopping activities that caused the tendinitis, icing the injured area, taking NSAIDs, and doing physical therapy. This information is not intended to replace advice given to you by your health care provider. Make sure you discuss any questions you have with your health care provider. Document Revised: 03/11/2019 Document Reviewed: 03/11/2019 Elsevier Patient Education  [place].

## 2021-11-30 NOTE — Telephone Encounter (Signed)
Diclofenac tabs not covered, diclofenac gel and Mobic 15 mg Prescription sent to pharmacy

## 2022-01-07 ENCOUNTER — Encounter (HOSPITAL_COMMUNITY): Payer: Self-pay

## 2022-01-07 ENCOUNTER — Emergency Department (HOSPITAL_COMMUNITY)
Admission: EM | Admit: 2022-01-07 | Discharge: 2022-01-07 | Disposition: A | Discharge status: Home / Self Care | Payer: Medicaid Other | Attending: Student | Admitting: Student

## 2022-01-07 ENCOUNTER — Other Ambulatory Visit: Payer: Self-pay

## 2022-01-07 ENCOUNTER — Emergency Department (HOSPITAL_COMMUNITY): Payer: Medicaid Other

## 2022-01-07 DIAGNOSIS — Z87891 Personal history of nicotine dependence: Secondary | ICD-10-CM | POA: Diagnosis not present

## 2022-01-07 DIAGNOSIS — Z79899 Other long term (current) drug therapy: Secondary | ICD-10-CM | POA: Diagnosis not present

## 2022-01-07 DIAGNOSIS — M7662 Achilles tendinitis, left leg: Secondary | ICD-10-CM | POA: Diagnosis not present

## 2022-01-07 DIAGNOSIS — Z7982 Long term (current) use of aspirin: Secondary | ICD-10-CM | POA: Diagnosis not present

## 2022-01-07 DIAGNOSIS — M25572 Pain in left ankle and joints of left foot: Secondary | ICD-10-CM | POA: Diagnosis not present

## 2022-01-07 DIAGNOSIS — I10 Essential (primary) hypertension: Secondary | ICD-10-CM | POA: Insufficient documentation

## 2022-01-07 DIAGNOSIS — Z955 Presence of coronary angioplasty implant and graft: Secondary | ICD-10-CM | POA: Diagnosis not present

## 2022-01-07 MED ORDER — LIDOCAINE 4 % EX PTCH: 1.0000 | MEDICATED_PATCH | Freq: Two times a day (BID) | CUTANEOUS | 0 refills | Status: DC

## 2022-01-07 MED ORDER — NAPROXEN 375 MG PO TABS: 375.0000 mg | ORAL_TABLET | Freq: Two times a day (BID) | ORAL | 0 refills | Status: DC

## 2022-01-07 MED ORDER — LIDOCAINE 5 % EX PTCH
1.0000 | MEDICATED_PATCH | CUTANEOUS | Status: DC
  Administered 2022-01-07: 1 via TRANSDERMAL
  Filled 2022-01-07: qty 1

## 2022-01-07 NOTE — ED Triage Notes (Signed)
Pt has had ankle pain x1 year. Seen by primary about 3 weeks ago, dx with achilles tendonitis, given medications but says that the pain has not gotten any better.  ?

## 2022-01-07 NOTE — ED Provider Notes (Signed)
[facility] Provider Note  CSN: [id number] Arrival date & time: 01/07/22 0107  Chief Complaint(s) Ankle Pain  HPI Kaitlin Torres is a 41 y.o. female who presents to the emergency department for evaluation of left ankle pain.  She states that this pain has been present for approximately 1 year and she saw her primary care physician approximately 1 month ago and was diagnosed with Achilles tendinitis.  She has been taking Mobic and Voltaren gel with minimal relief.  She states that the pain is worse after working as a [employer] day.  Denies specific trauma to the ankle.   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   Cough    Generalized headaches    Heart attack ([place]) 04/2019   Hyperlipidemia    Hypertension    PCOS (polycystic ovarian syndrome)    Rectal pain    Vitamin D deficiency 01/15/2021   Patient Active Problem List   Diagnosis Date Noted   Bilateral hand numbness 08/30/2021   Tongue lesion 02/09/2021   Abnormal TSH 02/09/2021   Breast discharge 11/04/2020   Back pain without sciatica 11/04/2020   Iron deficiency anemia 09/08/2020   Mixed hyperlipidemia 06/10/2019   GAD (generalized anxiety disorder) 06/10/2019   Vitamin D deficiency 06/10/2019   PCOS (polycystic ovarian syndrome) 06/10/2019   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 Authorizing Provider  Lidocaine (SALONPAS PAIN RELIEVING) 4 % PTCH Apply 1 each topically every 12 (twelve) hours. 01/07/22  Yes Derral Colucci, MD  naproxen (NAPROSYN) 375 MG tablet Take 1 tablet (375 mg total) by mouth 2 (two) times daily. 01/07/22  Yes Tayari Yankee, MD  aspirin EC 81 MG tablet Take 1 tablet (81 mg total) by mouth daily. Swallow whole. 07/07/21   Warren Lacy, PA-C  atorvastatin (LIPITOR) 80 MG tablet Take 1 tablet (80 mg total) by mouth daily. 07/07/21   Warren Lacy, PA-C  diclofenac Sodium (VOLTAREN) 1 % GEL Apply 2 g topically 4 (four) times daily. 11/30/21   Sharion Balloon, FNP  labetalol (NORMODYNE) 100 MG tablet Take 1 tablet (100 mg total) by mouth 2 (two) times daily. 07/07/21   Warren Lacy, PA-C  predniSONE (STERAPRED UNI-PAK 21 TAB) 10 MG (21) TBPK tablet Use as directed 11/30/21   Sharion Balloon, FNP                                                                                                                                    Past Surgical History Past Surgical History:  Procedure Laterality Date   ANKLE SURGERY  july 2005   screws placed in right ankle    BREAST DUCTAL  SYSTEM EXCISION Right 03/17/2021   Procedure: RIGHT BREAST DUCTAL EXCISION;  Surgeon: Virl Cagey, MD;  Location: [facility];  Service: General;  Laterality: Right;   CORONARY STENT INTERVENTION N/A 05/08/2019   Procedure: CORONARY STENT INTERVENTION;  Surgeon: Martinique, Peter M, MD;  Location: [facility];  Service: Cardiovascular;  Laterality: N/A;   FRACTURE SURGERY  2005   right ankle   HEMORRHOID SURGERY     HEMORRHOID SURGERY  2015   INCISION AND DRAINAGE BREAST ABSCESS  november 2011   left breast    LEFT HEART CATH AND CORONARY ANGIOGRAPHY N/A 05/08/2019   Procedure: LEFT HEART CATH AND CORONARY ANGIOGRAPHY;  Surgeon: Martinique, Peter M, MD;  Location: [facility];  Service: Cardiovascular;  Laterality: N/A;   Family History Family History  Problem Relation Age of Onset   Hypertension Mother    Heart disease Father        Cardiac arrest 2006   Diabetes Father    Hyperlipidemia Father    Stroke Father    Diabetes Paternal Grandmother    Cervical cancer Maternal Grandmother    Aneurysm Brother     Social History Social History   Tobacco Use   Smoking status: Former    Packs/day: 0.10    Years: 26.00    Pack  years: 2.60    Types: Cigarettes    Quit date: 09/16/2019    Years since quitting: 2.3   Smokeless tobacco: Never  Vaping Use   Vaping Use: Every day   Substances: Nicotine  Substance Use Topics   Alcohol use: No    Comment: quit 2018   Drug use: No   Allergies Patient has no known allergies.  Review of Systems Review of Systems  Musculoskeletal:  Positive for arthralgias.   Physical Exam Vital Signs  I have reviewed the triage vital signs BP (!) 143/88 (BP Location: Left Arm)    Pulse 79    Temp 97.7 F (36.5 C) (Oral)    Resp 18    Ht 5\' 9"  (1.753 m)    Wt 109 kg    LMP 01/07/2022    SpO2 100%    BMI 35.49 kg/m   Physical Exam Vitals and nursing note reviewed.  Constitutional:      General: She is not in acute distress.    Appearance: She is well-developed.  HENT:     Head: Normocephalic and atraumatic.  Eyes:     Conjunctiva/sclera: Conjunctivae normal.  Cardiovascular:     Rate and Rhythm: Normal rate and regular rhythm.     Heart sounds: No murmur heard. Pulmonary:     Effort: Pulmonary effort is normal. No respiratory distress.     Breath sounds: Normal breath sounds.  Abdominal:     Palpations: Abdomen is soft.     Tenderness: There is no abdominal tenderness.  Musculoskeletal:        General: Tenderness (At the Achilles on the left) present. No swelling.     Cervical back: Neck supple.  Skin:    General: Skin is warm and dry.     Capillary Refill: Capillary refill takes less than 2 seconds.  Neurological:     Mental Status: She is alert.  Psychiatric:        Mood and Affect: Mood normal.    ED Results and Treatments Labs (all labs ordered are listed, but only abnormal results are displayed) Labs Reviewed - No data to display  Radiology DG Ankle Complete Left  Result Date: 01/07/2022 CLINICAL DATA:  Left ankle pain EXAM: LEFT ANKLE  COMPLETE - 3+ VIEW COMPARISON:  None. FINDINGS: Normal alignment. No acute fracture or dislocation. Ankle mortise is aligned. Accessory ossicle subjacent to the medial malleolus. Moderate superior and plantar calcaneal spurs are present. Soft tissues are unremarkable. IMPRESSION: No acute abnormality. Calcaneal spurring. Electronically Signed   By: Fidela Salisbury M.D.   On: 01/07/2022 02:40    Pertinent labs & imaging results that were available during my care of the patient were reviewed by me and considered in my medical decision making (see MDM for details).  Medications Ordered in ED Medications  lidocaine (LIDODERM) 5 % 1 patch (has no administration in time range)                                                                                                                                     Procedures Procedures  (including critical care time)  Medical Decision Making / ED Course   This patient presents to the ED for concern of left ankle pain, this involves an extensive number of treatment options, and is a complaint that carries with it a high risk of complications and morbidity.  The differential diagnosis includes Achilles tendinitis, fracture, ligamentous injury  MDM: Patient seen in emergency room for evaluation of ankle pain.  Physical exam reveals point tenderness at the insertion point of the Achilles tendon on the calcaneus on the left.  X-ray of the ankle with calcaneal spurring but no fracture.  Patient presentation consistent with Achilles tendinitis and she will be placed in a ankle brace.  She has not had great relief with the Mobic we will discontinue this and switch to Naprosyn.  Patient given orthopedic follow-up and is safe for discharge at this time.  Additional history obtained:  -External records from outside source obtained and reviewed including: Chart review including previous notes, labs, imaging, consultation notes   Lab Tests: -I ordered, reviewed, and  interpreted labs.   The pertinent results include:   Labs Reviewed - No data to display    Imaging Studies ordered: I ordered imaging studies including ankel XR I independently visualized and interpreted imaging. I agree with the radiologist interpretation   Medicines ordered and prescription drug management: Meds ordered this encounter  Medications   naproxen (NAPROSYN) 375 MG tablet    Sig: Take 1 tablet (375 mg total) by mouth 2 (two) times daily.    Dispense:  20 tablet    Refill:  0   lidocaine (LIDODERM) 5 % 1 patch   Lidocaine (SALONPAS PAIN RELIEVING) 4 % PTCH    Sig: Apply 1 each topically every 12 (twelve) hours.    Dispense:  15 patch    Refill:  0    -I have reviewed the patients home medicines and have made adjustments as needed  Critical interventions none   Cardiac  Monitoring: The patient was maintained on a cardiac monitor.  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

## 2022-01-10 ENCOUNTER — Telehealth: Payer: Self-pay

## 2022-01-10 NOTE — Telephone Encounter (Signed)
Transition Care Management Unsuccessful Follow-up Telephone Call ? ?Date of discharge and from where:  01/07/2022 from [facility] ? ?Attempts:  1st Attempt ? ?Reason for unsuccessful TCM follow-up call:  Left voice message ? ? ? ?

## 2022-01-11 NOTE — Telephone Encounter (Signed)
Transition Care Management Unsuccessful Follow-up Telephone Call ? ?Date of discharge and from where:  01/07/2022 from [facility] ? ?Attempts:  2nd Attempt ? ?Reason for unsuccessful TCM follow-up call:  Left voice message ? ? ? ?

## 2022-01-12 NOTE — Telephone Encounter (Signed)
Transition Care Management Unsuccessful Follow-up Telephone Call ? ?Date of discharge and from where:  01/07/2022 from [facility] ? ?Attempts:  3rd Attempt ? ?Reason for unsuccessful TCM follow-up call:  Unable to reach patient ? ? ? ?

## 2022-02-11 DIAGNOSIS — M67872 Other specified disorders of synovium, left ankle and foot: Secondary | ICD-10-CM | POA: Diagnosis not present

## 2022-02-11 DIAGNOSIS — M7662 Achilles tendinitis, left leg: Secondary | ICD-10-CM | POA: Diagnosis not present

## 2022-02-11 DIAGNOSIS — M25572 Pain in left ankle and joints of left foot: Secondary | ICD-10-CM | POA: Diagnosis not present

## 2022-02-23 ENCOUNTER — Encounter: Payer: Self-pay | Admitting: Nurse Practitioner

## 2022-02-23 ENCOUNTER — Ambulatory Visit: Payer: Medicaid Other | Admitting: Nurse Practitioner

## 2022-02-23 ENCOUNTER — Other Ambulatory Visit: Payer: Self-pay

## 2022-02-23 VITALS — BP 131/87 | HR 70 | Temp 97.8°F | Resp 20 | Ht 69.0 in | Wt 228.0 lb

## 2022-02-23 DIAGNOSIS — B86 Scabies: Secondary | ICD-10-CM

## 2022-02-23 MED ORDER — PREDNISONE 20 MG PO TABS: 40.0000 mg | ORAL_TABLET | Freq: Every day | ORAL | 0 refills | Status: AC

## 2022-02-23 MED ORDER — PERMETHRIN 5 % EX CREA: 1.0000 "application " | TOPICAL_CREAM | Freq: Once | CUTANEOUS | 0 refills | Status: AC

## 2022-02-23 MED ORDER — HYDROXYZINE PAMOATE 25 MG PO CAPS: 25.0000 mg | ORAL_CAPSULE | Freq: Three times a day (TID) | ORAL | 0 refills | Status: DC | PRN

## 2022-02-23 MED ORDER — PERMETHRIN 0.5 % AERO: INHALATION_SPRAY | Freq: Once | 0 refills | Status: DC

## 2022-02-23 NOTE — Progress Notes (Signed)
? ?  Subjective:  ? ? Patient ID: Kaitlin Torres, female    DOB: October 31, 1981, 41 y.o.   MRN: [id number] ? ? ?Chief Complaint: Rash on feet and hands ? ? ?HPI ?Patient comes in today c/o itchy rash. Started about 2 months ago. Was seen when it was just on her hands and was told it was eczema and was given a steroid cream. That has not helped. Now the rash is in between hers fingers and on the tops of her feet. Has a few spots on her flank. She is questioning that may be scabies. Her dauhter now has itchy rash as well. ? ? ? ?Review of Systems  ?Constitutional:  Negative for diaphoresis.  ?Eyes:  Negative for pain.  ?Respiratory:  Negative for shortness of breath.   ?Cardiovascular:  Negative for chest pain, palpitations and leg swelling.  ?Gastrointestinal:  Negative for abdominal pain.  ?Endocrine: Negative for polydipsia.  ?Skin:  Negative for rash.  ?Neurological:  Negative for dizziness, weakness and headaches.  ?Hematological:  Does not bruise/bleed easily.  ?All other systems reviewed and are negative. ? ?   ?Objective:  ? Physical Exam ?Vitals reviewed.  ?Constitutional:   ?   Appearance: Normal appearance.  ?Cardiovascular:  ?   Rate and Rhythm: Normal rate and regular rhythm.  ?   Heart sounds: Normal heart sounds.  ?Pulmonary:  ?   Effort: Pulmonary effort is normal.  ?   Breath sounds: Normal breath sounds.  ?Skin: ?   General: Skin is warm.  ?   Findings: Rash (erythematous patchy rash between fingers, tops of faeet an dscattered in flank areas) present.  ?Neurological:  ?   General: No focal deficit present.  ?   Mental Status: She is alert and oriented to person, place, and time.  ?Psychiatric:     ?   Mood and Affect: Mood normal.     ?   Behavior: Behavior normal.  ? ?BP 131/87   Pulse 70   Temp 97.8 ?F (36.6 ?C) (Temporal)   Resp 20   Ht '5\' 9"'$  (1.753 m)   Wt 228 lb (103.4 kg)   BMI 33.67 kg/m?  ? ? ? ? ?   ?Assessment & Plan:  ? ?Kaitlin Torres in today with chief complaint of Rash on feet and  hands ? ? ?1. Scabies ?Avoid scratching ?Sedation precaution with hydroxyzine ?Will do steroid in case it is not scabies ?RTO prn ? ?- predniSONE (DELTASONE) 20 MG tablet; Take 2 tablets (40 mg total) by mouth daily with breakfast for 5 days. 2 po daily for 5 days  Dispense: 10 tablet; Refill: 0 ?- pyrethrins-piperonyl butoxide 0.5 % bottle; Apply topically once for 1 dose.  Dispense: 150 mL; Refill: 0 ?- hydrOXYzine (VISTARIL) 25 MG capsule; Take 1 capsule (25 mg total) by mouth every 8 (eight) hours as needed.  Dispense: 30 capsule; Refill: 0 ? ? ? ?The above assessment and management plan was discussed with the patient. The patient verbalized understanding of and has agreed to the management plan. Patient is aware to call the clinic if symptoms persist or worsen. Patient is aware when to return to the clinic for a follow-up visit. Patient educated on when it is appropriate to go to the emergency department.  ? ?Mary-Margaret Hassell Done, FNP ? ? ?

## 2022-02-23 NOTE — Patient Instructions (Signed)
Scabies, Adult  Scabies is a skin condition that happens when very small insects called mites get under the skin (infestation). This causes a rash and severe itchiness. Scabies is contagious, which means it can spread from person to person. If you get scabies, it is common for others in your household to get scabies too. With proper treatment, symptoms usually go away in 2-4 weeks. Scabies usually does not cause lasting problems. What are the causes? This condition is caused by tiny mites (Sarcoptes scabiei, or human itch mites) that can only be seen with a microscope. The mites get into the top layer of skin and lay eggs. Scabies can spread from person to person through: Close contact with a person who has scabies. Sharing or having contact with infested items, such as towels, bedding, or clothing. What increases the risk? The following factors may make you more likely to develop this condition: Living in a nursing home or other extended care facility. Having sexual contact with a partner who has scabies. Caring for others who are at increased risk for scabies. What are the signs or symptoms? Symptoms of this condition include: Severe itchiness. This is often worse at night. A rash that includes tiny red bumps or blisters. The rash commonly occurs on the hands, wrists, elbows, armpits, chest, waist, groin, or buttocks. The bumps may form a line (burrow) in some areas. Skin irritation. This can include scaly patches or sores. How is this diagnosed? This condition may be diagnosed based on: A physical exam of the skin. A skin test. Your health care provider may take a sample of your affected skin (skin scraping) and have it examined under a microscope for signs of mites. How is this treated? This condition may be treated with: Medicated cream or lotion that kills the mites. This is spread on the entire body and left on for several hours. Usually, one treatment with medicated cream or lotion is  enough to kill all the mites. In severe cases, the treatment may need to be repeated. Medicated cream that relieves itching. Medicines taken by mouth (orally) that: Relieve itching. Reduce the swelling and redness. Kill the mites. This treatment may be done in severe cases. Follow these instructions at home: Medicines Take or apply over-the-counter and prescription medicines only as told by your health care provider. Apply medicated cream or lotion as told by your health care provider. Do not wash off the medicated cream or lotion until the necessary amount of time has passed. Skin care Avoid scratching the affected areas of your skin. Keep your fingernails closely trimmed to reduce injury from scratching. Take cool baths or apply cool washcloths to your skin to help reduce itching. General instructions Clean all items that you had contact with during the 3 days before diagnosis. This includes bedding, clothing, towels, and furniture. Do this on the same day that you start treatment. Dry-clean items, or use hot water to wash items. Dry items on the hot dry cycle. Place items that cannot be washed into closed, airtight plastic bags for at least 3 days. The mites cannot live for more than 3 days away from human skin. Vacuum furniture and mattresses that you use. Make sure that other people who may have been infested are examined by a health care provider. These include members of your household and anyone who may have had contact with infested items. Keep all follow-up visits. This is important. Where to find more information Centers for Disease Control and Prevention: www.cdc.gov Contact a   health care provider if: You have itching that does not go away after 4 weeks of treatment. You continue to develop new bumps or burrows. You have redness, swelling, or pain in your rash area after treatment. You have fluid, blood, or pus coming from your rash. Summary Scabies is a skin condition that  causes a rash and severe itchiness. This condition is caused by tiny mites that get into the top layer of the skin and lay eggs. Scabies can spread from person to person. Follow treatments as recommended by your health care provider. Clean all items that you recently had contact with. This information is not intended to replace advice given to you by your health care provider. Make sure you discuss any questions you have with your health care provider. Document Revised: 02/21/2020 Document Reviewed: 02/21/2020 Elsevier Patient Education  2023 [employer]  

## 2022-02-23 NOTE — Addendum Note (Signed)
Addended by: Chevis Pretty on: 02/23/2022 03:37 PM ? ? Modules accepted: Orders ? ?

## 2022-03-02 ENCOUNTER — Ambulatory Visit: Payer: Medicaid Other | Admitting: Family Medicine

## 2022-03-07 DIAGNOSIS — M79672 Pain in left foot: Secondary | ICD-10-CM | POA: Diagnosis not present

## 2022-03-17 DIAGNOSIS — M79672 Pain in left foot: Secondary | ICD-10-CM | POA: Diagnosis not present

## 2022-03-23 DIAGNOSIS — M79672 Pain in left foot: Secondary | ICD-10-CM | POA: Diagnosis not present

## 2022-03-28 DIAGNOSIS — M79672 Pain in left foot: Secondary | ICD-10-CM | POA: Diagnosis not present

## 2022-04-05 DIAGNOSIS — M79672 Pain in left foot: Secondary | ICD-10-CM | POA: Diagnosis not present

## 2022-04-08 DIAGNOSIS — M67872 Other specified disorders of synovium, left ankle and foot: Secondary | ICD-10-CM | POA: Diagnosis not present

## 2022-04-08 DIAGNOSIS — M7662 Achilles tendinitis, left leg: Secondary | ICD-10-CM | POA: Diagnosis not present

## 2022-04-29 DIAGNOSIS — M25572 Pain in left ankle and joints of left foot: Secondary | ICD-10-CM | POA: Diagnosis not present

## 2022-05-13 DIAGNOSIS — M67872 Other specified disorders of synovium, left ankle and foot: Secondary | ICD-10-CM | POA: Diagnosis not present

## 2022-05-17 ENCOUNTER — Ambulatory Visit: Payer: Medicaid Other | Admitting: Family Medicine

## 2022-05-17 ENCOUNTER — Encounter: Payer: Self-pay | Admitting: Family Medicine

## 2022-05-17 VITALS — BP 126/77 | HR 68 | Temp 98.1°F | Ht 69.0 in | Wt 216.0 lb

## 2022-05-17 DIAGNOSIS — H66002 Acute suppurative otitis media without spontaneous rupture of ear drum, left ear: Secondary | ICD-10-CM | POA: Diagnosis not present

## 2022-05-17 DIAGNOSIS — L723 Sebaceous cyst: Secondary | ICD-10-CM

## 2022-05-17 DIAGNOSIS — Z8742 Personal history of other diseases of the female genital tract: Secondary | ICD-10-CM

## 2022-05-17 MED ORDER — AMOXICILLIN-POT CLAVULANATE 875-125 MG PO TABS: 1.0000 | ORAL_TABLET | Freq: Two times a day (BID) | ORAL | 0 refills | Status: AC

## 2022-05-17 MED ORDER — FLUCONAZOLE 150 MG PO TABS: 150.0000 mg | ORAL_TABLET | Freq: Once | ORAL | 1 refills | Status: AC

## 2022-05-17 MED ORDER — CLINDAMYCIN PHOS-BENZOYL PEROX 1-5 % EX GEL: Freq: Two times a day (BID) | CUTANEOUS | 0 refills | Status: DC

## 2022-05-17 NOTE — Progress Notes (Signed)
Subjective:  Patient ID: Kaitlin Torres, female    DOB: [birth year], 41 y.o.   MRN: [id number]  Patient Care Team: Loman Brooklyn, FNP as PCP - General (Family Medicine) Elouise Munroe, MD as PCP - Cardiology (Cardiology)   Chief Complaint:  Ear Drainage (Ear fullness)   HPI: Kaitlin Torres is a 41 y.o. female presenting on 05/17/2022 for Ear Drainage (Ear fullness)   Ear Drainage  There is pain in the left ear. This is a new problem. The current episode started 1 to 4 weeks ago. The problem occurs constantly. The problem has been gradually worsening. There has been no fever. The pain is moderate. Pertinent negatives include no abdominal pain, coughing, diarrhea, ear discharge, headaches, hearing loss, neck pain, rash, rhinorrhea, sore throat or vomiting. She has tried nothing for the symptoms. The treatment provided no relief.  Pt states she also feels like something is "growing" in her right ear canal. Reports she has been picking at the area without resolution.   Relevant past medical, surgical, family, and social history reviewed and updated as indicated.  Allergies and medications reviewed and updated. Data reviewed: Chart in Epic.   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   Cough    Generalized headaches    Heart attack ([place]) 04/2019   Hyperlipidemia    Hypertension    PCOS (polycystic ovarian syndrome)    Rectal pain    Vitamin D deficiency 01/15/2021    Past Surgical History:  Procedure Laterality Date   ANKLE SURGERY  july 2005   screws placed in right ankle    BREAST DUCTAL SYSTEM EXCISION Right 03/17/2021   Procedure: RIGHT BREAST DUCTAL EXCISION;  Surgeon: Virl Cagey, MD;  Location: [facility];  Service: General;  Laterality: Right;   CORONARY STENT INTERVENTION N/A 05/08/2019   Procedure: CORONARY STENT INTERVENTION;  Surgeon: Martinique, Peter M, MD;  Location: [facility];  Service: Cardiovascular;  Laterality:  N/A;   FRACTURE SURGERY  2005   right ankle   HEMORRHOID SURGERY     HEMORRHOID SURGERY  2015   INCISION AND DRAINAGE BREAST ABSCESS  november 2011   left breast    LEFT HEART CATH AND CORONARY ANGIOGRAPHY N/A 05/08/2019   Procedure: LEFT HEART CATH AND CORONARY ANGIOGRAPHY;  Surgeon: Martinique, Peter M, MD;  Location: [facility];  Service: Cardiovascular;  Laterality: N/A;    Social History   Socioeconomic History   Marital status: Married    Spouse name: Not on file   Number of children: 3   Years of education: 14   Highest education level: Associate degree: academic program  Occupational History   Occupation: Waitress  Tobacco Use   Smoking status: Former    Packs/day: 0.10    Years: 26.00    Total pack years: 2.60    Types: Cigarettes    Quit date: 09/16/2019    Years since quitting: 2.6   Smokeless tobacco: Never  Vaping Use   Vaping Use: Every day   Substances: Nicotine  Substance and Sexual Activity   Alcohol use: No    Comment: quit 2018   Drug use: No   Sexual activity: Yes    Birth control/protection: None    Comment: married, 3 kids, vaginal, trying for another  Other Topics Concern   Not on file  Social History Narrative   Lives at home with her mother, husband and  three daughters.   Right-handed.   Approximately 20 cups caffeine per day (4-5 glasses holding 32oz of tea).   Social Determinants of Health   Financial Resource Strain: Not on file  Food Insecurity: Not on file  Transportation Needs: Not on file  Physical Activity: Not on file  Stress: Not on file  Social Connections: Not on file  Intimate Partner Violence: Not on file    Outpatient Encounter Medications as of 05/17/2022  Medication Sig   amoxicillin-clavulanate (AUGMENTIN) 875-125 MG tablet Take 1 tablet by mouth 2 (two) times daily for 10 days.   clindamycin-benzoyl peroxide (BENZACLIN) gel Apply topically 2 (two) times daily.   fluconazole (DIFLUCAN) 150 MG tablet Take 1 tablet  (150 mg total) by mouth once for 1 dose.   aspirin EC 81 MG tablet Take 1 tablet (81 mg total) by mouth daily. Swallow whole.   atorvastatin (LIPITOR) 80 MG tablet Take 1 tablet (80 mg total) by mouth daily.   labetalol (NORMODYNE) 100 MG tablet Take 1 tablet (100 mg total) by mouth 2 (two) times daily.   [DISCONTINUED] hydrOXYzine (VISTARIL) 25 MG capsule Take 1 capsule (25 mg total) by mouth every 8 (eight) hours as needed.   [DISCONTINUED] Lidocaine (SALONPAS PAIN RELIEVING) 4 % PTCH Apply 1 each topically every 12 (twelve) hours.   No facility-administered encounter medications on file as of 05/17/2022.    Allergies  Allergen Reactions   Diclofenac Hives    Review of Systems  Constitutional:  Negative for appetite change, chills, diaphoresis, fatigue, fever and unexpected weight change.  HENT:  Positive for ear pain. Negative for congestion, dental problem, drooling, ear discharge, facial swelling, hearing loss, mouth sores, nosebleeds, postnasal drip, rhinorrhea, sinus pressure, sinus pain, sneezing, sore throat, tinnitus, trouble swallowing and voice change.   Eyes: Negative.   Respiratory:  Negative for cough, chest tightness and shortness of breath.   Cardiovascular:  Negative for chest pain, palpitations and leg swelling.  Gastrointestinal:  Negative for abdominal pain, blood in stool, constipation, diarrhea, nausea and vomiting.  Endocrine: Negative.   Genitourinary:  Negative for decreased urine volume, difficulty urinating, dysuria, frequency and urgency.  Musculoskeletal:  Negative for arthralgias, myalgias and neck pain.  Skin: Negative.  Negative for rash.  Allergic/Immunologic: Negative.   Neurological:  Negative for dizziness, weakness and headaches.  Hematological: Negative.   Psychiatric/Behavioral:  Negative for confusion, hallucinations, sleep disturbance and suicidal ideas.   All other systems reviewed and are negative.       Objective:  BP 126/77   Pulse 68    Temp 98.1 F (36.7 C)   Ht '5\' 9"'$  (1.753 m)   Wt 216 lb (98 kg)   LMP 04/18/2022 (Exact Date)   SpO2 96%   BMI 31.90 kg/m    Wt Readings from Last 3 Encounters:  05/17/22 216 lb (98 kg)  02/23/22 228 lb (103.4 kg)  01/07/22 240 lb 4.8 oz (109 kg)    Physical Exam Vitals and nursing note reviewed.  Constitutional:      General: She is not in acute distress.    Appearance: Normal appearance. She is obese. She is not ill-appearing, toxic-appearing or diaphoretic.  HENT:     Head: Normocephalic and atraumatic.     Right Ear: Tympanic membrane and external ear normal.     Left Ear: Hearing, ear canal and external ear normal. Tympanic membrane is erythematous and bulging. Tympanic membrane is not perforated.     Ears:      Nose:  Nose normal.     Mouth/Throat:     Mouth: Mucous membranes are moist.  Eyes:     Conjunctiva/sclera: Conjunctivae normal.     Pupils: Pupils are equal, round, and reactive to light.  Cardiovascular:     Rate and Rhythm: Normal rate and regular rhythm.  Pulmonary:     Effort: Pulmonary effort is normal.     Breath sounds: Normal breath sounds.  Skin:    General: Skin is warm and dry.     Capillary Refill: Capillary refill takes less than 2 seconds.  Neurological:     General: No focal deficit present.     Mental Status: She is alert and oriented to person, place, and time.  Psychiatric:        Mood and Affect: Mood normal.        Behavior: Behavior normal.        Thought Content: Thought content normal.        Judgment: Judgment normal.     Results for orders placed or performed in visit on 03/18/21  HIV antibody (with reflex)  Result Value Ref Range   HIV Screen 4th Generation wRfx Non Reactive Non Reactive  RPR  Result Value Ref Range   RPR Ser Ql Non Reactive Non Reactive  Acute Viral Hepatitis (HAV, HBV, HCV)  Result Value Ref Range   Hep A IgM Negative Negative   Hepatitis B Surface Ag Negative Negative   Hep B C IgM Negative  Negative   HCV Ab <0.1 0.0 - 0.9 s/co ratio  Interpretation:  Result Value Ref Range   HCV Interp 1: Comment        Pertinent labs & imaging results that were available during my care of the patient were reviewed by me and considered in my medical decision making.  Assessment & Plan:  Darling was seen today for ear drainage.  Diagnoses and all orders for this visit:  Non-recurrent acute suppurative otitis media of left ear without spontaneous rupture of tympanic membrane Symptomatic care discussed in detail. Medications as prescribed. Report any new, worsening, or persistent symptoms.  -     amoxicillin-clavulanate (AUGMENTIN) 875-125 MG tablet; Take 1 tablet by mouth 2 (two) times daily for 10 days.  Inflamed sebaceous cyst Symptomatic care discussed in detail. Warm compresses. Avoid picking at lesion. Medications as prescribed. Report any new, worsening, or persistent symptoms.  -     clindamycin-benzoyl peroxide (BENZACLIN) gel; Apply topically 2 (two) times daily.  History of vaginitis Pt aware when to take.  -     fluconazole (DIFLUCAN) 150 MG tablet; Take 1 tablet (150 mg total) by mouth once for 1 dose.     Continue all other maintenance medications.  Follow up plan: Return if symptoms worsen or fail to improve.   Continue healthy lifestyle choices, including diet (rich in fruits, vegetables, and lean proteins, and low in salt and simple carbohydrates) and exercise (at least 30 minutes of moderate physical activity daily).  Educational handout given for AOM  The above assessment and management plan was discussed with the patient. The patient verbalized understanding of and has agreed to the management plan. Patient is aware to call the clinic if they develop any new symptoms or if symptoms persist or worsen. Patient is aware when to return to the clinic for a follow-up visit. Patient educated on when it is appropriate to go to the emergency department.   Monia Pouch,  FNP-C [facility] [phone]

## 2022-05-19 ENCOUNTER — Telehealth: Payer: Self-pay | Admitting: Family Medicine

## 2022-05-19 DIAGNOSIS — L723 Sebaceous cyst: Secondary | ICD-10-CM

## 2022-05-19 MED ORDER — CLINDAMYCIN PHOS-BENZOYL PEROX 1-5 % EX GEL: Freq: Two times a day (BID) | CUTANEOUS | 0 refills | Status: DC

## 2022-05-19 NOTE — Telephone Encounter (Signed)
I resent with directions to supply the 1-5%. This is normally the issue with this prescription.

## 2022-05-19 NOTE — Telephone Encounter (Signed)
Patient seen Rakes who is OFF Will send to PCP to advise

## 2022-05-19 NOTE — Telephone Encounter (Signed)
Pt aware of provider feedback and voiced understanding. 

## 2022-08-07 ENCOUNTER — Other Ambulatory Visit: Payer: Self-pay | Admitting: Family Medicine

## 2022-08-07 DIAGNOSIS — Z8742 Personal history of other diseases of the female genital tract: Secondary | ICD-10-CM

## 2022-08-17 ENCOUNTER — Emergency Department (HOSPITAL_COMMUNITY): Payer: Medicaid Other

## 2022-08-17 ENCOUNTER — Emergency Department (HOSPITAL_COMMUNITY)
Admission: EM | Admit: 2022-08-17 | Discharge: 2022-08-17 | Disposition: A | Discharge status: Home / Self Care | Payer: Medicaid Other | Attending: Student | Admitting: Student

## 2022-08-17 ENCOUNTER — Other Ambulatory Visit: Payer: Self-pay

## 2022-08-17 ENCOUNTER — Encounter (HOSPITAL_COMMUNITY): Payer: Self-pay | Admitting: Emergency Medicine

## 2022-08-17 DIAGNOSIS — J069 Acute upper respiratory infection, unspecified: Secondary | ICD-10-CM | POA: Diagnosis not present

## 2022-08-17 DIAGNOSIS — Z1152 Encounter for screening for COVID-19: Secondary | ICD-10-CM | POA: Diagnosis not present

## 2022-08-17 DIAGNOSIS — B9789 Other viral agents as the cause of diseases classified elsewhere: Secondary | ICD-10-CM | POA: Diagnosis not present

## 2022-08-17 DIAGNOSIS — R059 Cough, unspecified: Secondary | ICD-10-CM | POA: Diagnosis not present

## 2022-08-17 LAB — RESP PANEL BY RT-PCR (FLU A&B, COVID) ARPGX2
Influenza A by PCR: NEGATIVE
Influenza B by PCR: NEGATIVE
SARS Coronavirus 2 by RT PCR: NEGATIVE

## 2022-08-17 MED ORDER — BENZONATATE 100 MG PO CAPS: 100.0000 mg | ORAL_CAPSULE | Freq: Three times a day (TID) | ORAL | 0 refills | Status: DC

## 2022-08-17 MED ORDER — ALBUTEROL SULFATE HFA 108 (90 BASE) MCG/ACT IN AERS: 1.0000 | INHALATION_SPRAY | Freq: Four times a day (QID) | RESPIRATORY_TRACT | 0 refills | Status: DC | PRN

## 2022-08-17 MED ORDER — FLUTICASONE PROPIONATE 50 MCG/ACT NA SUSP: 2.0000 | Freq: Every day | NASAL | 0 refills | Status: DC

## 2022-08-17 MED ORDER — CETIRIZINE HCL 10 MG PO TABS: 10.0000 mg | ORAL_TABLET | Freq: Every day | ORAL | 0 refills | Status: DC

## 2022-08-17 NOTE — ED Triage Notes (Signed)
Pt c/o cough x week. Has prod cough but unable to tell me color. Had diarrhea x 4 hours a few days ago. Nad. No resp distress or sob noted.

## 2022-08-17 NOTE — Discharge Instructions (Addendum)
Pleasure taking care of you here in the emergency department today.  Your chest x-ray did not show any evidence of pneumonia.  Your COVID and flu test are negative.  These will be resulted in your MyChart.  I have written you for a few medications to help with your symptoms.  Follow-up with primary care provider symptoms do not improve over the next week

## 2022-08-17 NOTE — ED Notes (Signed)
Patient transported to X-ray 

## 2022-08-17 NOTE — ED Provider Notes (Signed)
[facility] EMERGENCY DEPARTMENT Provider Note   CSN: [id number] Arrival date & time: 08/17/22  1414    History  Chief Complaint  Patient presents with   Cough    Kaitlin Torres is a 41 y.o. female for evaluation of cough and feeling unwell.  Cough x1 week.  Feels like she is wheezing.  No history of asthma.  Has some mild sputum production.  No fever.  Episode of loose stool on Sunday, resolved.  No abdominal pain.  No recent antibiotics. Tolerating PO intake. No known sick contacts however does work at [employer].   HPI     Home Medications Prior to Admission medications   Medication Sig Start Date End Date Taking? Authorizing Provider  albuterol (VENTOLIN HFA) 108 (90 Base) MCG/ACT inhaler Inhale 1-2 puffs into the lungs every 6 (six) hours as needed for wheezing or shortness of breath. 08/17/22  Yes Machell Wirthlin A, PA-C  benzonatate (TESSALON) 100 MG capsule Take 1 capsule (100 mg total) by mouth every 8 (eight) hours. 08/17/22  Yes Justice Aguirre A, PA-C  cetirizine (ZYRTEC ALLERGY) 10 MG tablet Take 1 tablet (10 mg total) by mouth daily. 08/17/22  Yes Kemba Hoppes A, PA-C  fluticasone (FLONASE) 50 MCG/ACT nasal spray Place 2 sprays into both nostrils daily. 08/17/22  Yes Amante Fomby A, PA-C  aspirin EC 81 MG tablet Take 1 tablet (81 mg total) by mouth daily. Swallow whole. 07/07/21   Warren Lacy, PA-C  atorvastatin (LIPITOR) 80 MG tablet Take 1 tablet (80 mg total) by mouth daily. 07/07/21   Warren Lacy, PA-C  clindamycin-benzoyl peroxide (BENZACLIN) gel Apply topically 2 (two) times daily. 05/19/22   Loman Brooklyn, FNP  labetalol (NORMODYNE) 100 MG tablet Take 1 tablet (100 mg total) by mouth 2 (two) times daily. 07/07/21   Warren Lacy, PA-C      Allergies    Diclofenac    Review of Systems   Review of Systems  Constitutional: Negative.   HENT: Negative.    Respiratory:  Positive for cough and wheezing. Negative for apnea, choking,  chest tightness, shortness of breath and stridor.   Cardiovascular: Negative.   Gastrointestinal:  Positive for diarrhea (1 episode 4 days PTA< resolved).  Genitourinary: Negative.   Musculoskeletal: Negative.   Skin: Negative.   Neurological: Negative.   All other systems reviewed and are negative.   Physical Exam Updated Vital Signs BP (!) 149/96 (BP Location: Right Arm)   Pulse 90   Temp 97.6 F (36.4 C) (Oral)   Resp 18   LMP 08/16/2022   SpO2 99%  Physical Exam Vitals and nursing note reviewed.  Constitutional:      General: She is not in acute distress.    Appearance: She is well-developed. She is not ill-appearing, toxic-appearing or diaphoretic.  HENT:     Head: Normocephalic and atraumatic.     Nose: Nose normal.     Mouth/Throat:     Mouth: Mucous membranes are moist.  Eyes:     Pupils: Pupils are equal, round, and reactive to light.  Cardiovascular:     Rate and Rhythm: Normal rate.     Pulses: Normal pulses.     Heart sounds: Normal heart sounds.  Pulmonary:     Effort: Pulmonary effort is normal. No respiratory distress.     Breath sounds: Normal breath sounds.     Comments: Clear bilaterally, speaks in full sentences without difficulty.  Cough on exam. Abdominal:  General: Bowel sounds are normal. There is no distension.     Palpations: Abdomen is soft.  Musculoskeletal:        General: No swelling, tenderness, deformity or signs of injury. Normal range of motion.     Cervical back: Normal range of motion.     Right lower leg: No edema.     Left lower leg: No edema.     Comments: No tenderness, full range of motion, compartment soft  Skin:    General: Skin is warm and dry.     Capillary Refill: Capillary refill takes less than 2 seconds.  Neurological:     General: No focal deficit present.     Mental Status: She is alert and oriented to person, place, and time.     Comments: ambulatory  Psychiatric:        Mood and Affect: Mood normal.     ED Results / Procedures / Treatments   Labs (all labs ordered are listed, but only abnormal results are displayed) Labs Reviewed  RESP PANEL BY RT-PCR (FLU A&B, COVID) [id number]    EKG None  Radiology DG Chest 2 View  Result Date: 08/17/2022 CLINICAL DATA:  Cough. EXAM: CHEST - 2 VIEW COMPARISON:  05/08/2019 FINDINGS: Heart size is normal. No pleural effusion or edema. No airspace opacities identified. The visualized osseous structures are unremarkable. IMPRESSION: No active cardiopulmonary disease. Electronically Signed   By: Kerby Moors M.D.   On: 08/17/2022 14:50    Procedures Procedures    Medications Ordered in ED Medications - No data to display  ED Course/ Medical Decision Making/ A&P    41 year old here for evaluation of URI symptoms over the last week.  Feels like she has been wheezing at home.  No history of asthma.  No fever.  No clinical evidence of VTE.  No PND orthopnea.  Does not look grossly fluid overloaded on exam.  Her heart and lungs are clear.  Abdomen soft, nontender.  Labs and imaging personally viewed and interpreted:  COVID, flu negative Chest x-ray without pneumonia, pneumothorax, edema, cardiomegaly  Reassessed.  Discussed labs and imaging.  Suspect she likely has viral URI.  Low suspicion for myocarditis, pericarditis from viral infection.  She has no active wheeze on exam however given her reported history will DC home with albuterol inhaler.  DC home with symptomatic management, close follow-up outpatient with PCP, return for new or worsening symptoms.  The patient has been appropriately medically screened and/or stabilized in the ED. I have low suspicion for any other emergent medical condition which would require further screening, evaluation or treatment in the ED or require inpatient management.  Patient is hemodynamically stable and in no acute distress.  Patient able to ambulate in department prior to ED.  Evaluation does not show acute  pathology that would require ongoing or additional emergent interventions while in the emergency department or further inpatient treatment.  I have discussed the diagnosis with the patient and answered all questions.  Pain is been managed while in the emergency department and patient has no further complaints prior to discharge.  Patient is comfortable with plan discussed in room and is stable for discharge at this time.  I have discussed strict return precautions for returning to the emergency department.  Patient was encouraged to follow-up with PCP/specialist refer to at discharge.                           Medical  Decision Making Amount and/or Complexity of Data Reviewed External Data Reviewed: labs, radiology and notes. Labs: ordered. Decision-making details documented in ED Course. Radiology: ordered and independent interpretation performed. Decision-making details documented in ED Course.  Risk OTC drugs. Prescription drug management. Decision regarding hospitalization. Diagnosis or treatment significantly limited by social determinants of health.          Final Clinical Impression(s) / ED Diagnoses Final diagnoses:  Viral URI with cough    Rx / DC Orders ED Discharge Orders          Ordered    albuterol (VENTOLIN HFA) 108 (90 Base) MCG/ACT inhaler  Every 6 hours PRN        08/17/22 1514    benzonatate (TESSALON) 100 MG capsule  Every 8 hours        08/17/22 1514    cetirizine (ZYRTEC ALLERGY) 10 MG tablet  Daily        08/17/22 1514    fluticasone (FLONASE) 50 MCG/ACT nasal spray  Daily        08/17/22 1514              Daena Alper A, PA-C 08/17/22 1528    Kommor, Debe Coder, MD 08/17/22 2148

## 2022-10-14 ENCOUNTER — Ambulatory Visit: Payer: Medicaid Other | Admitting: Family Medicine

## 2022-10-17 ENCOUNTER — Ambulatory Visit: Payer: Medicaid Other | Admitting: Nurse Practitioner

## 2022-10-21 ENCOUNTER — Ambulatory Visit: Payer: Medicaid Other | Admitting: Nurse Practitioner

## 2022-10-24 ENCOUNTER — Ambulatory Visit: Payer: Medicaid Other | Admitting: Family Medicine

## 2022-10-25 ENCOUNTER — Ambulatory Visit: Payer: Medicaid Other | Admitting: Family Medicine

## 2022-11-01 ENCOUNTER — Ambulatory Visit: Payer: Medicaid Other | Admitting: Family Medicine

## 2022-11-01 ENCOUNTER — Encounter: Payer: Self-pay | Admitting: Family Medicine

## 2022-11-01 ENCOUNTER — Other Ambulatory Visit (HOSPITAL_COMMUNITY)
Admission: RE | Admit: 2022-11-01 | Discharge: 2022-11-01 | Disposition: A | Discharge status: Home / Self Care | Payer: Medicaid Other | Source: Ambulatory Visit | Attending: Family Medicine | Admitting: Family Medicine

## 2022-11-01 VITALS — BP 139/85 | HR 73 | Temp 98.2°F | Ht 69.0 in | Wt 221.0 lb

## 2022-11-01 DIAGNOSIS — E559 Vitamin D deficiency, unspecified: Secondary | ICD-10-CM

## 2022-11-01 DIAGNOSIS — N926 Irregular menstruation, unspecified: Secondary | ICD-10-CM

## 2022-11-01 DIAGNOSIS — I10 Essential (primary) hypertension: Secondary | ICD-10-CM | POA: Diagnosis not present

## 2022-11-01 DIAGNOSIS — Z6832 Body mass index (BMI) 32.0-32.9, adult: Secondary | ICD-10-CM

## 2022-11-01 DIAGNOSIS — J069 Acute upper respiratory infection, unspecified: Secondary | ICD-10-CM

## 2022-11-01 DIAGNOSIS — R21 Rash and other nonspecific skin eruption: Secondary | ICD-10-CM

## 2022-11-01 DIAGNOSIS — E782 Mixed hyperlipidemia: Secondary | ICD-10-CM

## 2022-11-01 DIAGNOSIS — I252 Old myocardial infarction: Secondary | ICD-10-CM

## 2022-11-01 DIAGNOSIS — D500 Iron deficiency anemia secondary to blood loss (chronic): Secondary | ICD-10-CM

## 2022-11-01 MED ORDER — FLUTICASONE PROPIONATE 50 MCG/ACT NA SUSP: 2.0000 | Freq: Every day | NASAL | 6 refills | Status: DC

## 2022-11-01 MED ORDER — BENZONATATE 100 MG PO CAPS: 100.0000 mg | ORAL_CAPSULE | Freq: Three times a day (TID) | ORAL | 0 refills | Status: DC | PRN

## 2022-11-01 MED ORDER — LABETALOL HCL 100 MG PO TABS: 100.0000 mg | ORAL_TABLET | Freq: Two times a day (BID) | ORAL | 2 refills | Status: DC

## 2022-11-01 NOTE — Progress Notes (Addendum)
Subjective:  Patient ID: Kaitlin Torres, female    DOB: Dec 29, 1980, 41 y.o.   MRN: [id number]  Patient Care Team: Baruch Gouty, FNP as PCP - General (Family Medicine)   Chief Complaint:  Medical Management of Chronic Issues and contraeptive management   HPI: Kaitlin Torres is a 41 y.o. female presenting on 11/01/2022 for Medical Management of Chronic Issues and contraeptive management  Pt presents today to establish care with new provider as her former provider is no longer at this practice. She would like to restart birth control and to be evaluated for ear fullness and cough. States the ear fullness has been ongoing for several weeks. No fever, chills, weakness, or fatigue. She has not tried anything for the symptoms.   1. Essential hypertension Pt states she has not been taking any of her medications. States she moved and just forgot to start taking them again. Denies chest pain, headaches, leg swelling, weakness, or confusion.   2. Vitamin D deficiency Has not been taking repletion therapy. Denies arthralgias, recent fractures, or trouble walking.   3. Mixed hyperlipidemia Not on statin therapy. Does not follow a healthy diet or exercise routine.   4. History of non-ST elevation myocardial infarction (NSTEMI) Has not followed up with cardiology in a long time, advised to make an appointment and to restart medications as prescribe.   5. Irregular periods States she had a very light and short period on 10/28/2022. Last unprotected intercourse 10/26/2022.     Relevant past medical, surgical, family, and social history reviewed and updated as indicated.  Allergies and medications reviewed and updated. Data reviewed: Chart in Epic.   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   Cough    Generalized headaches    Heart attack ([place]) 04/2019   Hyperlipidemia    Hypertension    PCOS (polycystic ovarian syndrome)    Rectal pain    Vitamin D  deficiency 01/15/2021    Past Surgical History:  Procedure Laterality Date   ANKLE SURGERY  july 2005   screws placed in right ankle    BREAST DUCTAL SYSTEM EXCISION Right 03/17/2021   Procedure: RIGHT BREAST DUCTAL EXCISION;  Surgeon: Virl Cagey, MD;  Location: [facility];  Service: General;  Laterality: Right;   CORONARY STENT INTERVENTION N/A 05/08/2019   Procedure: CORONARY STENT INTERVENTION;  Surgeon: Martinique, Peter M, MD;  Location: [facility];  Service: Cardiovascular;  Laterality: N/A;   FRACTURE SURGERY  2005   right ankle   HEMORRHOID SURGERY     HEMORRHOID SURGERY  2015   INCISION AND DRAINAGE BREAST ABSCESS  november 2011   left breast    LEFT HEART CATH AND CORONARY ANGIOGRAPHY N/A 05/08/2019   Procedure: LEFT HEART CATH AND CORONARY ANGIOGRAPHY;  Surgeon: Martinique, Peter M, MD;  Location: [facility];  Service: Cardiovascular;  Laterality: N/A;    Social History   Socioeconomic History   Marital status: Single    Spouse name: Not on file   Number of children: 3   Years of education: 14   Highest education level: Associate degree: academic program  Occupational History   Occupation: Waitress  Tobacco Use   Smoking status: Former    Packs/day: 0.10    Years: 26.00    Total pack years: 2.60    Types: Cigarettes    Quit date: 09/16/2019    Years since quitting: 3.1  Smokeless tobacco: Never  Vaping Use   Vaping Use: Every day   Substances: Nicotine  Substance and Sexual Activity   Alcohol use: No    Comment: quit 2018   Drug use: No   Sexual activity: Yes    Birth control/protection: None    Comment: married, 3 kids, vaginal, trying for another  Other Topics Concern   Not on file  Social History Narrative   Lives at home with her mother, husband and three daughters.   Right-handed.   Approximately 20 cups caffeine per day (4-5 glasses holding 32oz of tea).   Social Determinants of Health   Financial Resource Strain: Not on file  Food  Insecurity: Not on file  Transportation Needs: Not on file  Physical Activity: Not on file  Stress: Not on file  Social Connections: Not on file  Intimate Partner Violence: Not on file    Outpatient Encounter Medications as of 11/01/2022  Medication Sig   albuterol (VENTOLIN HFA) 108 (90 Base) MCG/ACT inhaler Inhale 1-2 puffs into the lungs every 6 (six) hours as needed for wheezing or shortness of breath.   aspirin EC 81 MG tablet Take 1 tablet (81 mg total) by mouth daily. Swallow whole.   [DISCONTINUED] atorvastatin (LIPITOR) 80 MG tablet Take 1 tablet (80 mg total) by mouth daily.   [DISCONTINUED] benzonatate (TESSALON) 100 MG capsule Take 1 capsule (100 mg total) by mouth every 8 (eight) hours.   [DISCONTINUED] cetirizine (ZYRTEC ALLERGY) 10 MG tablet Take 1 tablet (10 mg total) by mouth daily.   [DISCONTINUED] clindamycin-benzoyl peroxide (BENZACLIN) gel Apply topically 2 (two) times daily.   [DISCONTINUED] fluticasone (FLONASE) 50 MCG/ACT nasal spray Place 2 sprays into both nostrils daily.   [DISCONTINUED] labetalol (NORMODYNE) 100 MG tablet Take 1 tablet (100 mg total) by mouth 2 (two) times daily.   benzonatate (TESSALON PERLES) 100 MG capsule Take 1 capsule (100 mg total) by mouth 3 (three) times daily as needed for cough.   fluticasone (FLONASE) 50 MCG/ACT nasal spray Place 2 sprays into both nostrils daily.   labetalol (NORMODYNE) 100 MG tablet Take 1 tablet (100 mg total) by mouth 2 (two) times daily.   No facility-administered encounter medications on file as of 11/01/2022.    Allergies  Allergen Reactions   Diclofenac Hives    Review of Systems  Constitutional:  Negative for activity change, appetite change, chills, diaphoresis, fatigue, fever and unexpected weight change.  HENT:  Positive for congestion, ear pain, postnasal drip and rhinorrhea. Negative for dental problem, drooling, ear discharge, facial swelling, hearing loss, mouth sores, nosebleeds, sinus pressure,  sinus pain, sneezing, sore throat, tinnitus, trouble swallowing and voice change.   Eyes: Negative.  Negative for photophobia and visual disturbance.  Respiratory:  Negative for cough, chest tightness, shortness of breath and wheezing.   Cardiovascular:  Negative for chest pain, palpitations and leg swelling.  Gastrointestinal:  Negative for abdominal distention, abdominal pain, anal bleeding, blood in stool, constipation, diarrhea, nausea, rectal pain and vomiting.  Endocrine: Negative.  Negative for polydipsia, polyphagia and polyuria.  Genitourinary:  Positive for menstrual problem. Negative for decreased urine volume, difficulty urinating, dyspareunia, dysuria, enuresis, flank pain, frequency, genital sores, hematuria, pelvic pain, urgency, vaginal bleeding, vaginal discharge and vaginal pain.  Musculoskeletal:  Negative for arthralgias and myalgias.  Skin: Negative.   Allergic/Immunologic: Negative.   Neurological:  Negative for dizziness, tremors, seizures, syncope, facial asymmetry, speech difficulty, weakness, light-headedness, numbness and headaches.  Hematological: Negative.   Psychiatric/Behavioral:  Negative for  confusion, hallucinations, sleep disturbance and suicidal ideas.   All other systems reviewed and are negative.       Objective:  BP 139/85   Pulse 73   Temp 98.2 F (36.8 C)   Ht 5' 9" (1.753 m)   Wt 221 lb (100.2 kg)   LMP 10/28/2022 (Exact Date)   SpO2 99%   BMI 32.64 kg/m    Wt Readings from Last 3 Encounters:  11/01/22 221 lb (100.2 kg)  05/17/22 216 lb (98 kg)  02/23/22 228 lb (103.4 kg)    Physical Exam Vitals and nursing note reviewed.  Constitutional:      General: She is not in acute distress.    Appearance: Normal appearance. She is well-developed and well-groomed. She is obese. She is not ill-appearing, toxic-appearing or diaphoretic.  HENT:     Head: Normocephalic and atraumatic.     Jaw: There is normal jaw occlusion.     Right Ear:  Hearing normal. A middle ear effusion is present. Tympanic membrane is not erythematous.     Left Ear: Hearing normal. A middle ear effusion is present. Tympanic membrane is not erythematous.     Nose: Congestion and rhinorrhea present. Rhinorrhea is clear.     Right Turbinates: Not enlarged, swollen or pale.     Left Turbinates: Not enlarged, swollen or pale.     Mouth/Throat:     Lips: Pink.     Mouth: Mucous membranes are moist.     Pharynx: Oropharynx is clear. Uvula midline. Posterior oropharyngeal erythema present. No pharyngeal swelling, oropharyngeal exudate or uvula swelling.     Tonsils: No tonsillar exudate or tonsillar abscesses.     Comments: Postnasal drainage, cobblestoning  Eyes:     General: Lids are normal.     Extraocular Movements: Extraocular movements intact.     Conjunctiva/sclera: Conjunctivae normal.     Pupils: Pupils are equal, round, and reactive to light.  Neck:     Thyroid: No thyroid mass, thyromegaly or thyroid tenderness.     Vascular: No carotid bruit or JVD.     Trachea: Trachea and phonation normal.  Cardiovascular:     Rate and Rhythm: Normal rate and regular rhythm.     Chest Wall: PMI is not displaced.     Pulses: Normal pulses.     Heart sounds: Normal heart sounds. No murmur heard.    No friction rub. No gallop.  Pulmonary:     Effort: Pulmonary effort is normal. No respiratory distress.     Breath sounds: Normal breath sounds. No wheezing.  Abdominal:     General: There is no abdominal bruit.     Palpations: There is no hepatomegaly or splenomegaly.  Musculoskeletal:        General: Normal range of motion.     Cervical back: Normal range of motion and neck supple.     Right lower leg: No edema.     Left lower leg: No edema.  Lymphadenopathy:     Cervical: No cervical adenopathy.  Skin:    General: Skin is warm and dry.     Capillary Refill: Capillary refill takes less than 2 seconds.     Coloration: Skin is not cyanotic, jaundiced or  pale.     Findings: Rash present.     Comments: Dry, scaly patches to bilateral hands  Neurological:     General: No focal deficit present.     Mental Status: She is alert and oriented to person, place, and time.  Sensory: Sensation is intact.     Motor: Motor function is intact.     Coordination: Coordination is intact.     Gait: Gait is intact.     Deep Tendon Reflexes: Reflexes are normal and symmetric.  Psychiatric:        Attention and Perception: Attention and perception normal.        Mood and Affect: Mood and affect normal.        Speech: Speech normal.        Behavior: Behavior normal. Behavior is cooperative.        Thought Content: Thought content normal.        Cognition and Memory: Cognition and memory normal.        Judgment: Judgment normal.     Results for orders placed or performed during the hospital encounter of 08/17/22  Resp Panel by RT-PCR (Flu A&B, Covid) Anterior Nasal Swab   Specimen: Anterior Nasal Swab  Result Value Ref Range   SARS Coronavirus 2 by RT PCR NEGATIVE NEGATIVE   Influenza A by PCR NEGATIVE NEGATIVE   Influenza B by PCR NEGATIVE NEGATIVE       Pertinent labs & imaging results that were available during my care of the patient were reviewed by me and considered in my medical decision making.  Assessment & Plan:  Kazue was seen today for medical management of chronic issues and contraeptive management.  Diagnoses and all orders for this visit:  Essential hypertension Restart medications as prescribed. Labs pending. DASH diet and exercise encouraged. Follow up with cardiology as discussed.  -     labetalol (NORMODYNE) 100 MG tablet; Take 1 tablet (100 mg total) by mouth 2 (two) times daily. -     Anemia Profile B -     CMP14+EGFR -     Lipid panel -     Thyroid Panel With TSH  Vitamin D deficiency Labs pending. Eat foods rich in Vit D including milk, orange juice, yogurt with vitamin D added, salmon or mackerel, canned tuna fish,  cereals with vitamin D added, and cod liver oil. Get out in the sun but make sure to wear at least SPF 30 sunscreen.  -     Anemia Profile B -     VITAMIN D 25 Hydroxy (Vit-D Deficiency, Fractures)  Mixed hyperlipidemia Diet and exercise encouraged. Will start statin therapy if warranted.  -     Lipid panel  History of non-ST elevation myocardial infarction (NSTEMI) Encouraged to restart medications and to follow up with cardiology. Diet and exercise encouraged. Labs pending  -     labetalol (NORMODYNE) 100 MG tablet; Take 1 tablet (100 mg total) by mouth 2 (two) times daily. -     Anemia Profile B -     CMP14+EGFR -     Lipid panel  Irregular periods Unprotected intercourse 10/26/2022. Will check labs today. Consider OCPs if labs normal.  -     Anemia Profile B -     Urine cytology ancillary only -     Cancel: Pregnancy, urine -     hCG, serum, qualitative  BMI 32.0-32.9,adult Diet and exercise encouraged. Labs pending.  -     Anemia Profile B -     CMP14+EGFR -     Lipid panel -     Thyroid Panel With TSH -     VITAMIN D 25 Hydroxy (Vit-D Deficiency, Fractures)  URI with cough and congestion No indications of acute bacterial infection. Symptomatic  care discussed in detail. Medications as prescribed. Report new, worsening, or persistent symptoms.  -     fluticasone (FLONASE) 50 MCG/ACT nasal spray; Place 2 sprays into both nostrils daily. -     benzonatate (TESSALON PERLES) 100 MG capsule; Take 1 capsule (100 mg total) by mouth 3 (three) times daily as needed for cough.     Continue all other maintenance medications.  Follow up plan: Return in about 3 months (around 01/31/2023), or if symptoms worsen or fail to improve, for CPE with PAP.   Continue healthy lifestyle choices, including diet (rich in fruits, vegetables, and lean proteins, and low in salt and simple carbohydrates) and exercise (at least 30 minutes of moderate physical activity daily).  Educational handout  given for DASH diet, HTN  The above assessment and management plan was discussed with the patient. The patient verbalized understanding of and has agreed to the management plan. Patient is aware to call the clinic if they develop any new symptoms or if symptoms persist or worsen. Patient is aware when to return to the clinic for a follow-up visit. Patient educated on when it is appropriate to go to the emergency department.   Monia Pouch, FNP-C [facility] [phone]

## 2022-11-01 NOTE — Patient Instructions (Signed)

## 2022-11-01 NOTE — Addendum Note (Signed)
Addended by: Baruch Gouty on: 11/01/2022 10:02 AM   Modules accepted: Orders

## 2022-11-02 ENCOUNTER — Encounter: Payer: Self-pay | Admitting: Family Medicine

## 2022-11-02 LAB — ANEMIA PROFILE B
Basophils Absolute: 0.1 10*3/uL (ref 0.0–0.2)
Basos: 1 %
EOS (ABSOLUTE): 0.2 10*3/uL (ref 0.0–0.4)
Eos: 2 %
Ferritin: 17 ng/mL (ref 15–150)
Folate: 3.2 ng/mL (ref >3.0)
Hematocrit: 36.4 % (ref 34.0–46.6)
Hemoglobin: 10.8 g/dL — ABNORMAL LOW (ref 11.1–15.9)
Immature Grans (Abs): 0 10*3/uL (ref 0.0–0.1)
Immature Granulocytes: 0 %
Iron Saturation: 5 % — CL (ref 15–55)
Iron: 19 ug/dL — ABNORMAL LOW (ref 27–159)
Lymphocytes Absolute: 2.2 10*3/uL (ref 0.7–3.1)
Lymphs: 22 %
MCH: 18.9 pg — ABNORMAL LOW (ref 26.6–33.0)
MCHC: 29.7 g/dL — ABNORMAL LOW (ref 31.5–35.7)
MCV: 64 fL — ABNORMAL LOW (ref 79–97)
Monocytes Absolute: 0.6 10*3/uL (ref 0.1–0.9)
Monocytes: 6 %
Neutrophils Absolute: 6.8 10*3/uL (ref 1.4–7.0)
Neutrophils: 69 %
Platelets: 423 10*3/uL (ref 150–450)
RBC: 5.7 x10E6/uL — ABNORMAL HIGH (ref 3.77–5.28)
RDW: 18.5 % — ABNORMAL HIGH (ref 11.7–15.4)
Retic Ct Pct: 1.4 % (ref 0.6–2.6)
Total Iron Binding Capacity: 389 ug/dL (ref 250–450)
UIBC: 370 ug/dL (ref 131–425)
Vitamin B-12: 564 pg/mL (ref 232–1245)
WBC: 9.9 10*3/uL (ref 3.4–10.8)

## 2022-11-02 LAB — CMP14+EGFR
ALT: 15 IU/L (ref 0–32)
AST: 17 IU/L (ref 0–40)
Albumin/Globulin Ratio: 1.6 (ref 1.2–2.2)
Albumin: 4.2 g/dL (ref 3.9–4.9)
Alkaline Phosphatase: 71 IU/L (ref 44–121)
BUN/Creatinine Ratio: 14 (ref 9–23)
BUN: 10 mg/dL (ref 6–24)
Bilirubin Total: <0.2 mg/dL (ref 0.0–1.2)
CO2: 20 mmol/L (ref 20–29)
Calcium: 9.5 mg/dL (ref 8.7–10.2)
Chloride: 103 mmol/L (ref 96–106)
Creatinine, Ser: 0.71 mg/dL (ref 0.57–1.00)
Globulin, Total: 2.6 g/dL (ref 1.5–4.5)
Glucose: 97 mg/dL (ref 70–99)
Potassium: 3.8 mmol/L (ref 3.5–5.2)
Sodium: 140 mmol/L (ref 134–144)
Total Protein: 6.8 g/dL (ref 6.0–8.5)
eGFR: 110 mL/min/{1.73_m2} (ref >59)

## 2022-11-02 LAB — URINE CYTOLOGY ANCILLARY ONLY
Bacterial Vaginitis-Urine: NEGATIVE
Candida Urine: NEGATIVE
Chlamydia: NEGATIVE
Comment: NEGATIVE
Comment: NEGATIVE
Comment: NORMAL
Neisseria Gonorrhea: NEGATIVE
Trichomonas: NEGATIVE

## 2022-11-02 LAB — THYROID PANEL WITH TSH
Free Thyroxine Index: 2 (ref 1.2–4.9)
T3 Uptake Ratio: 25 % (ref 24–39)
T4, Total: 8 ug/dL (ref 4.5–12.0)
TSH: 2.4 u[IU]/mL (ref 0.450–4.500)

## 2022-11-02 LAB — LIPID PANEL
Chol/HDL Ratio: 5.6 ratio — ABNORMAL HIGH (ref 0.0–4.4)
Cholesterol, Total: 202 mg/dL — ABNORMAL HIGH (ref 100–199)
HDL: 36 mg/dL — ABNORMAL LOW (ref >39)
LDL Chol Calc (NIH): 147 mg/dL — ABNORMAL HIGH (ref 0–99)
Triglycerides: 103 mg/dL (ref 0–149)
VLDL Cholesterol Cal: 19 mg/dL (ref 5–40)

## 2022-11-02 LAB — VITAMIN D 25 HYDROXY (VIT D DEFICIENCY, FRACTURES): Vit D, 25-Hydroxy: 11.9 ng/mL — ABNORMAL LOW (ref 30.0–100.0)

## 2022-11-02 LAB — HCG, SERUM, QUALITATIVE: hCG,Beta Subunit,Qual,Serum: NEGATIVE m[IU]/mL (ref <6)

## 2022-11-02 MED ORDER — IRON (FERROUS SULFATE) 325 (65 FE) MG PO TABS: 325.0000 mg | ORAL_TABLET | Freq: Every day | ORAL | 6 refills | Status: DC

## 2022-11-02 MED ORDER — NORGESTIM-ETH ESTRAD TRIPHASIC 0.18/0.215/0.25 MG-25 MCG PO TABS: 1.0000 | ORAL_TABLET | Freq: Every day | ORAL | 11 refills | Status: DC

## 2022-11-02 MED ORDER — AMOXICILLIN-POT CLAVULANATE 875-125 MG PO TABS: 1.0000 | ORAL_TABLET | Freq: Two times a day (BID) | ORAL | 0 refills | Status: AC

## 2022-11-02 MED ORDER — VITAMIN D (ERGOCALCIFEROL) 1.25 MG (50000 UNIT) PO CAPS: 50000.0000 [IU] | ORAL_CAPSULE | ORAL | 6 refills | Status: DC

## 2022-11-02 NOTE — Addendum Note (Signed)
Addended by: Baruch Gouty on: 11/02/2022 03:55 PM   Modules accepted: Orders

## 2022-11-02 NOTE — Addendum Note (Signed)
Addended by: Baruch Gouty on: 11/02/2022 10:00 AM   Modules accepted: Orders

## 2022-11-08 DIAGNOSIS — H6992 Unspecified Eustachian tube disorder, left ear: Secondary | ICD-10-CM | POA: Diagnosis not present

## 2022-11-08 DIAGNOSIS — J101 Influenza due to other identified influenza virus with other respiratory manifestations: Secondary | ICD-10-CM | POA: Diagnosis not present

## 2022-11-08 DIAGNOSIS — R059 Cough, unspecified: Secondary | ICD-10-CM | POA: Diagnosis not present

## 2022-11-08 DIAGNOSIS — J329 Chronic sinusitis, unspecified: Secondary | ICD-10-CM | POA: Diagnosis not present

## 2022-11-10 ENCOUNTER — Other Ambulatory Visit: Payer: Self-pay | Admitting: Family Medicine

## 2022-11-10 DIAGNOSIS — Z8742 Personal history of other diseases of the female genital tract: Secondary | ICD-10-CM

## 2022-11-10 MED ORDER — FLUCONAZOLE 150 MG PO TABS: 150.0000 mg | ORAL_TABLET | Freq: Once | ORAL | 0 refills | Status: AC

## 2022-11-21 DIAGNOSIS — L219 Seborrheic dermatitis, unspecified: Secondary | ICD-10-CM | POA: Diagnosis not present

## 2022-11-21 DIAGNOSIS — L209 Atopic dermatitis, unspecified: Secondary | ICD-10-CM | POA: Diagnosis not present

## 2022-11-21 DIAGNOSIS — L918 Other hypertrophic disorders of the skin: Secondary | ICD-10-CM | POA: Diagnosis not present

## 2022-12-22 ENCOUNTER — Ambulatory Visit: Payer: Medicaid Other | Admitting: Family Medicine

## 2022-12-22 ENCOUNTER — Encounter: Payer: Self-pay | Admitting: Family Medicine

## 2022-12-22 ENCOUNTER — Other Ambulatory Visit (HOSPITAL_COMMUNITY)
Admission: RE | Admit: 2022-12-22 | Discharge: 2022-12-22 | Disposition: A | Discharge status: Home / Self Care | Payer: Medicaid Other | Source: Ambulatory Visit | Attending: Family Medicine | Admitting: Family Medicine

## 2022-12-22 VITALS — BP 134/89 | HR 73 | Ht 69.0 in | Wt 213.0 lb

## 2022-12-22 DIAGNOSIS — Z202 Contact with and (suspected) exposure to infections with a predominantly sexual mode of transmission: Secondary | ICD-10-CM | POA: Insufficient documentation

## 2022-12-22 NOTE — Addendum Note (Signed)
Addended by: Alphonzo Dublin on: 12/22/2022 04:23 PM   Modules accepted: Orders

## 2022-12-22 NOTE — Progress Notes (Signed)
BP 134/89   Pulse 73   Ht 5' 9"$  (1.753 m)   Wt 213 lb (96.6 kg)   SpO2 96%   BMI 31.45 kg/m    Subjective:   Patient ID: Kaitlin Torres, female    DOB: 10/14/1981, 42 y.o.   MRN: [id number]  HPI: Kaitlin Torres is a 42 y.o. female presenting on 12/22/2022 for STD screen (Denies symptoms)   HPI Is coming in today with complaints of possible STD exposure.  She says that she had a female partner who she was with for some time but she recently found out that he was unfaithful while they were still together.  She says she has not had intercourse with him for at least a month and a half.  She denies any vaginal symptoms or discharge or irritation but she just wants to be tested just to be sure that she has not contracted anything.    Relevant past medical, surgical, family and social history reviewed and updated as indicated. Interim medical history since our last visit reviewed. Allergies and medications reviewed and updated.  Review of Systems  Constitutional:  Negative for chills and fever.  Eyes:  Negative for visual disturbance.  Respiratory:  Negative for chest tightness and shortness of breath.   Cardiovascular:  Negative for chest pain and leg swelling.  Genitourinary:  Negative for difficulty urinating, dysuria, genital sores, menstrual problem, vaginal bleeding, vaginal discharge and vaginal pain.  Musculoskeletal:  Negative for back pain and gait problem.  Skin:  Negative for rash.  Neurological:  Negative for light-headedness and headaches.  Psychiatric/Behavioral:  Negative for agitation and behavioral problems.   All other systems reviewed and are negative.   Per HPI unless specifically indicated above   Allergies as of 12/22/2022       Reactions   Diclofenac Hives        Medication List        Accurate as of December 22, 2022  4:10 PM. If you have any questions, ask your nurse or doctor.          STOP taking these medications    albuterol 108 (90 Base)  MCG/ACT inhaler Commonly known as: VENTOLIN HFA Stopped by: Fransisca Kaufmann Paizley Ramella, MD   benzonatate 100 MG capsule Commonly known as: Best boy Stopped by: Worthy Rancher, MD   fluticasone 50 MCG/ACT nasal spray Commonly known as: FLONASE Stopped by: Fransisca Kaufmann Tiago Humphrey, MD   Iron (Ferrous Sulfate) 325 (65 Fe) MG Tabs Stopped by: Worthy Rancher, MD   Norgestimate-Ethinyl Estradiol Triphasic 0.18/0.215/0.25 MG-25 MCG tab Commonly known as: Ortho Tri-Cyclen Lo Stopped by: Fransisca Kaufmann Tamarra Geiselman, MD   Vitamin D (Ergocalciferol) 1.25 MG (50000 UNIT) Caps capsule Commonly known as: DRISDOL Stopped by: Fransisca Kaufmann Oluwadarasimi Favor, MD       TAKE these medications    aspirin EC 81 MG tablet Take 1 tablet (81 mg total) by mouth daily. Swallow whole.   atorvastatin 80 MG tablet Commonly known as: LIPITOR Take 1 tablet by mouth daily.   labetalol 100 MG tablet Commonly known as: NORMODYNE Take 1 tablet (100 mg total) by mouth 2 (two) times daily.         Objective:   BP 134/89   Pulse 73   Ht 5' 9"$  (1.753 m)   Wt 213 lb (96.6 kg)   SpO2 96%   BMI 31.45 kg/m   Wt Readings from Last 3 Encounters:  12/22/22 213 lb (96.6 kg)  11/01/22 221  lb (100.2 kg)  05/17/22 216 lb (98 kg)    Physical Exam Vitals and nursing note reviewed.  Constitutional:      General: She is not in acute distress.    Appearance: She is well-developed. She is not diaphoretic.  Eyes:     Conjunctiva/sclera: Conjunctivae normal.  Skin:    General: Skin is warm and dry.     Findings: No rash.  Neurological:     Mental Status: She is alert and oriented to person, place, and time.     Coordination: Coordination normal.  Psychiatric:        Behavior: Behavior normal.       Assessment & Plan:   Problem List Items Addressed This Visit   None Visit Diagnoses     Possible exposure to STD    -  Primary   Relevant Orders   RPR   HepB+HepC+HIV Panel   HSV(herpes simplex vrs) 1+2 ab-IgG    Urine cytology ancillary only       Will do STD testing.  Also encouraged her to get HIV testing and hepatitis C in 6 months to 1 year Follow up plan: Return if symptoms worsen or fail to improve.  Counseling provided for all of the vaccine components Orders Placed This Encounter  Procedures   RPR   HepB+HepC+HIV Panel   HSV(herpes simplex vrs) 1+2 ab-IgG    Caryl Pina, MD [facility] 12/22/2022, 4:10 PM

## 2022-12-26 ENCOUNTER — Telehealth: Payer: Self-pay | Admitting: Family Medicine

## 2022-12-26 LAB — URINE CYTOLOGY ANCILLARY ONLY
Chlamydia: NEGATIVE
Comment: NEGATIVE
Comment: NORMAL
Neisseria Gonorrhea: NEGATIVE

## 2022-12-26 LAB — HEPB+HEPC+HIV PANEL
HIV Screen 4th Generation wRfx: NONREACTIVE
Hep B C IgM: NEGATIVE
Hep B Core Total Ab: NEGATIVE
Hep B E Ab: NEGATIVE
Hep B E Ag: NEGATIVE
Hep B Surface Ab, Qual: REACTIVE
Hep C Virus Ab: NONREACTIVE
Hepatitis B Surface Ag: NEGATIVE

## 2022-12-26 LAB — HSV 1 AND 2 AB, IGG
HSV 1 Glycoprotein G Ab, IgG: >62.2 index — ABNORMAL HIGH (ref 0.00–0.90)
HSV 2 IgG, Type Spec: <0.91 index (ref 0.00–0.90)

## 2022-12-26 LAB — RPR: RPR Ser Ql: NONREACTIVE

## 2022-12-27 DIAGNOSIS — H5213 Myopia, bilateral: Secondary | ICD-10-CM | POA: Diagnosis not present

## 2023-02-04 DIAGNOSIS — R051 Acute cough: Secondary | ICD-10-CM | POA: Diagnosis not present

## 2023-02-04 DIAGNOSIS — R0981 Nasal congestion: Secondary | ICD-10-CM | POA: Diagnosis not present

## 2023-02-04 DIAGNOSIS — J0100 Acute maxillary sinusitis, unspecified: Secondary | ICD-10-CM | POA: Diagnosis not present

## 2023-02-04 DIAGNOSIS — J029 Acute pharyngitis, unspecified: Secondary | ICD-10-CM | POA: Diagnosis not present

## 2023-02-05 ENCOUNTER — Emergency Department (HOSPITAL_COMMUNITY)
Admission: EM | Admit: 2023-02-05 | Discharge: 2023-02-06 | Disposition: A | Discharge status: Home / Self Care | Payer: Medicaid Other | Attending: Emergency Medicine | Admitting: Emergency Medicine

## 2023-02-05 ENCOUNTER — Encounter (HOSPITAL_COMMUNITY): Payer: Self-pay | Admitting: Emergency Medicine

## 2023-02-05 ENCOUNTER — Emergency Department (HOSPITAL_COMMUNITY): Payer: Medicaid Other

## 2023-02-05 DIAGNOSIS — R051 Acute cough: Secondary | ICD-10-CM | POA: Diagnosis not present

## 2023-02-05 DIAGNOSIS — R0682 Tachypnea, not elsewhere classified: Secondary | ICD-10-CM | POA: Insufficient documentation

## 2023-02-05 DIAGNOSIS — R059 Cough, unspecified: Secondary | ICD-10-CM | POA: Diagnosis not present

## 2023-02-05 DIAGNOSIS — M791 Myalgia, unspecified site: Secondary | ICD-10-CM | POA: Insufficient documentation

## 2023-02-05 DIAGNOSIS — Z7982 Long term (current) use of aspirin: Secondary | ICD-10-CM | POA: Insufficient documentation

## 2023-02-05 DIAGNOSIS — I10 Essential (primary) hypertension: Secondary | ICD-10-CM | POA: Diagnosis not present

## 2023-02-05 DIAGNOSIS — R0789 Other chest pain: Secondary | ICD-10-CM | POA: Diagnosis not present

## 2023-02-05 DIAGNOSIS — R0602 Shortness of breath: Secondary | ICD-10-CM | POA: Diagnosis not present

## 2023-02-05 DIAGNOSIS — R062 Wheezing: Secondary | ICD-10-CM | POA: Diagnosis not present

## 2023-02-05 DIAGNOSIS — R079 Chest pain, unspecified: Secondary | ICD-10-CM | POA: Insufficient documentation

## 2023-02-05 LAB — CBC WITH DIFFERENTIAL/PLATELET
Abs Immature Granulocytes: 0.03 10*3/uL (ref 0.00–0.07)
Basophils Absolute: 0 10*3/uL (ref 0.0–0.1)
Basophils Relative: 0 %
Eosinophils Absolute: 0 10*3/uL (ref 0.0–0.5)
Eosinophils Relative: 0 %
HCT: 38.7 % (ref 36.0–46.0)
Hemoglobin: 11.8 g/dL — ABNORMAL LOW (ref 12.0–15.0)
Immature Granulocytes: 0 %
Lymphocytes Relative: 10 %
Lymphs Abs: 1 10*3/uL (ref 0.7–4.0)
MCH: 20.8 pg — ABNORMAL LOW (ref 26.0–34.0)
MCHC: 30.5 g/dL (ref 30.0–36.0)
MCV: 68.3 fL — ABNORMAL LOW (ref 80.0–100.0)
Monocytes Absolute: 0.4 10*3/uL (ref 0.1–1.0)
Monocytes Relative: 5 %
Neutro Abs: 8 10*3/uL — ABNORMAL HIGH (ref 1.7–7.7)
Neutrophils Relative %: 85 %
Platelets: 346 10*3/uL (ref 150–400)
RBC: 5.67 MIL/uL — ABNORMAL HIGH (ref 3.87–5.11)
RDW: 19 % — ABNORMAL HIGH (ref 11.5–15.5)
WBC: 9.4 10*3/uL (ref 4.0–10.5)
nRBC: 0 % (ref 0.0–0.2)

## 2023-02-05 LAB — BASIC METABOLIC PANEL
Anion gap: 10 (ref 5–15)
BUN: 8 mg/dL (ref 6–20)
CO2: 18 mmol/L — ABNORMAL LOW (ref 22–32)
Calcium: 8.9 mg/dL (ref 8.9–10.3)
Chloride: 105 mmol/L (ref 98–111)
Creatinine, Ser: 0.61 mg/dL (ref 0.44–1.00)
GFR, Estimated: >60 mL/min (ref >60)
Glucose, Bld: 184 mg/dL — ABNORMAL HIGH (ref 70–99)
Potassium: 3.9 mmol/L (ref 3.5–5.1)
Sodium: 133 mmol/L — ABNORMAL LOW (ref 135–145)

## 2023-02-05 LAB — TROPONIN I (HIGH SENSITIVITY): Troponin I (High Sensitivity): 2 ng/L (ref <18)

## 2023-02-05 LAB — BRAIN NATRIURETIC PEPTIDE: B Natriuretic Peptide: 16 pg/mL (ref 0.0–100.0)

## 2023-02-05 LAB — D-DIMER, QUANTITATIVE: D-Dimer, Quant: 0.44 ug/mL-FEU (ref 0.00–0.50)

## 2023-02-05 MED ORDER — IPRATROPIUM-ALBUTEROL 0.5-2.5 (3) MG/3ML IN SOLN
3.0000 mL | RESPIRATORY_TRACT | Status: AC
  Administered 2023-02-05: 3 mL via RESPIRATORY_TRACT
  Filled 2023-02-05: qty 3

## 2023-02-05 MED ORDER — LACTATED RINGERS IV BOLUS
1000.0000 mL | Freq: Once | INTRAVENOUS | Status: AC
  Administered 2023-02-05: 1000 mL via INTRAVENOUS

## 2023-02-05 NOTE — ED Triage Notes (Signed)
Pt here from home with c/o chest tightness and general aches mostly back and neck pain , had neg flu covid and strept rsv test yesterday at [facility]

## 2023-02-05 NOTE — ED Provider Triage Note (Signed)
Emergency Medicine Provider Triage Evaluation Note  Kaitlin Torres , a 42 y.o. female  was evaluated in triage.  Pt complains of generalized bodyaches, cough, fevers and sore throat for the last 3 days.  Started having chest pain as well, usually worse with coughing.  He initiated urgent care, negative strep and COVID. Cough improved with the cough medicine..  Review of Systems  Per HPI  Physical Exam  BP (!) 158/91   Pulse (!) 114   Temp 98.1 F (36.7 C) (Oral)   Resp 18   SpO2 98%  Gen:   Awake, no distress   Resp:  Normal effort  MSK:   Moves extremities without difficulty  Other:  Mildly tachycardic   Medical Decision Making  Medically screening exam initiated at 10:46 PM.  Appropriate orders placed.  Kaitlin Torres was informed that the remainder of the evaluation will be completed by another provider, this initial triage assessment does not replace that evaluation, and the importance of remaining in the ED until their evaluation is complete.     Sherrill Raring, PA-C 02/05/23 [id number]

## 2023-02-05 NOTE — ED Provider Notes (Signed)
[place] EMERGENCY DEPARTMENT AT [facility] Provider Note   CSN: [id number] Arrival date & time: 02/05/23  2221     History  Chief Complaint  Patient presents with   Shortness of Breath    Kaitlin Torres is a 42 y.o. female.  42 year old female with history of hypertension who presents the ER today secondary to shortness of breath and chest pain with coughing.  Patient states that she also has diffuse body aches.  Started Wednesday.  She went to urgent care couple days ago it started azithromycin, breathing treatment, cough medicine.  States that today she had little bit of worsening shortness of breath however the cough is better.  Feels dehydrated as well a bit lightheaded symptoms have a headache.  No neck pain.   Shortness of Breath      Home Medications Prior to Admission medications   Medication Sig Start Date End Date Taking? Authorizing Provider  aspirin EC 81 MG tablet Take 1 tablet (81 mg total) by mouth daily. Swallow whole. 07/07/21   Cannon Kettle, PA-C  atorvastatin (LIPITOR) 80 MG tablet Take 1 tablet by mouth daily.    [provider]  labetalol (NORMODYNE) 100 MG tablet Take 1 tablet (100 mg total) by mouth 2 (two) times daily. 11/01/22   Sonny Masters, FNP      Allergies    Diclofenac    Review of Systems   Review of Systems  Respiratory:  Positive for shortness of breath.     Physical Exam Updated Vital Signs BP (!) 158/91   Pulse (!) 114   Temp 98.1 F (36.7 C) (Oral)   Resp 18   SpO2 98%  Physical Exam Vitals and nursing note reviewed.  Constitutional:      Appearance: She is well-developed.  HENT:     Head: Normocephalic and atraumatic.  Cardiovascular:     Rate and Rhythm: Normal rate and regular rhythm.  Pulmonary:     Effort: Tachypnea present. No respiratory distress.     Breath sounds: No stridor. Decreased breath sounds and wheezing present.  Abdominal:     General: There is no distension.   Musculoskeletal:     Cervical back: Normal range of motion.     Right lower leg: No edema.     Left lower leg: No edema.  Neurological:     Mental Status: She is alert.     ED Results / Procedures / Treatments   Labs (all labs ordered are listed, but only abnormal results are displayed) Labs Reviewed  BASIC METABOLIC PANEL - Abnormal; Notable for the following components:      Result Value   Sodium 133 (*)    CO2 18 (*)    Glucose, Bld 184 (*)    All other components within normal limits  CBC WITH DIFFERENTIAL/PLATELET - Abnormal; Notable for the following components:   RBC 5.67 (*)    Hemoglobin 11.8 (*)    MCV 68.3 (*)    MCH 20.8 (*)    RDW 19.0 (*)    Neutro Abs 8.0 (*)    All other components within normal limits  D-DIMER, QUANTITATIVE  BRAIN NATRIURETIC PEPTIDE  POC URINE PREG, ED  TROPONIN I (HIGH SENSITIVITY)    EKG EKG Interpretation  Date/Time:  Sunday February 05 2023 22:41:22 EDT Ventricular Rate:  93 PR Interval:  127 QRS Duration: 93 QT Interval:  371 QTC Calculation: 462 R Axis:   60 Text Interpretation: Sinus rhythm LAE, consider  biatrial enlargement Low voltage, extremity leads Baseline wander in lead(s) I II III aVR aVL V4 V5 Confirmed by Marily Memos [id number]) on 02/05/2023 11:05:24 PM  Radiology DG Chest 2 View  Result Date: 02/05/2023 CLINICAL DATA:  Chest pain, productive cough. EXAM: CHEST - 2 VIEW COMPARISON:  08/17/2022. FINDINGS: The heart size and mediastinal contours are within normal limits. No consolidation, effusion, or pneumothorax. No acute osseous abnormality. IMPRESSION: No active cardiopulmonary disease. Electronically Signed   By: Thornell Sartorius M.D.   On: 02/05/2023 22:57    Procedures Procedures    Medications Ordered in ED Medications  ipratropium-albuterol (DUONEB) 0.5-2.5 (3) MG/3ML nebulizer solution 3 mL (3 mLs Nebulization Given 02/05/23 2329)  lactated ringers bolus 1,000 mL (0 mLs Intravenous Stopped 02/06/23 0022)     ED Course/ Medical Decision Making/ A&P Clinical Course as of 02/06/23 0133  Sun Feb 05, 2023  2329 CO2(!): 18 No anion gap. Possibly related to albuterol use? [JM]  2330 WBC: 9.4 reassuring [JM]  2330 Troponin I (High Sensitivity): 2 Chest pain doesn't sound cardiac, no indication for second.  [JM]  2330 DG Chest 2 View Viewed and interpreted by [facility] with some mild right sided hilar prominence but no obvious focal consolidation or cardiomegaly. Already on azithromycin, no indication for escalation of antibiotics. Pending d dimer if more imaging will be needed.  [JM]  2331 Pulse Rate(!): 114 [JM]  2331 BP(!): 158/91 Will add on a bnp  [JM]  Mon Feb 06, 2023  0028 B Natriuretic Peptide: 16.0 reassuring [JM]  0028 D-Dimer, Quant: 0.44 Reassuring, doubt PE. Likely just viral pleuritis  [JM]    Clinical Course User Index [JM] Keston Seever, Barbara Cower, MD                             Medical Decision Making Amount and/or Complexity of Data Reviewed Labs: ordered. Decision-making details documented in ED Course. Radiology: ordered. Decision-making details documented in ED Course.  Risk Prescription drug management.   Workup reassuring. Doubt emergent cause for symptoms. Improved VS. Already on inhaler, steroids, antibiotics at home. No change in course indicated at this time.   Final Clinical Impression(s) / ED Diagnoses Final diagnoses:  Acute cough    Rx / DC Orders ED Discharge Orders     None         Toriann Spadoni, Barbara Cower, MD 02/06/23 [phone]

## 2023-02-27 DIAGNOSIS — L918 Other hypertrophic disorders of the skin: Secondary | ICD-10-CM | POA: Diagnosis not present

## 2023-02-27 DIAGNOSIS — L209 Atopic dermatitis, unspecified: Secondary | ICD-10-CM | POA: Diagnosis not present

## 2023-03-07 DIAGNOSIS — Z3202 Encounter for pregnancy test, result negative: Secondary | ICD-10-CM | POA: Diagnosis not present

## 2023-03-07 DIAGNOSIS — Z7289 Other problems related to lifestyle: Secondary | ICD-10-CM | POA: Diagnosis not present

## 2023-03-07 DIAGNOSIS — Z113 Encounter for screening for infections with a predominantly sexual mode of transmission: Secondary | ICD-10-CM | POA: Diagnosis not present

## 2023-03-07 DIAGNOSIS — Z1151 Encounter for screening for human papillomavirus (HPV): Secondary | ICD-10-CM | POA: Diagnosis not present

## 2023-03-07 DIAGNOSIS — Z01419 Encounter for gynecological examination (general) (routine) without abnormal findings: Secondary | ICD-10-CM | POA: Diagnosis not present

## 2023-03-07 DIAGNOSIS — Z114 Encounter for screening for human immunodeficiency virus [HIV]: Secondary | ICD-10-CM | POA: Diagnosis not present

## 2023-03-08 ENCOUNTER — Other Ambulatory Visit (HOSPITAL_COMMUNITY): Payer: Self-pay | Admitting: Nurse Practitioner

## 2023-03-08 DIAGNOSIS — Z1231 Encounter for screening mammogram for malignant neoplasm of breast: Secondary | ICD-10-CM

## 2023-03-08 DIAGNOSIS — R928 Other abnormal and inconclusive findings on diagnostic imaging of breast: Secondary | ICD-10-CM

## 2023-03-08 DIAGNOSIS — N920 Excessive and frequent menstruation with regular cycle: Secondary | ICD-10-CM

## 2023-03-16 ENCOUNTER — Ambulatory Visit (HOSPITAL_COMMUNITY)
Admission: RE | Admit: 2023-03-16 | Discharge: 2023-03-16 | Disposition: A | Discharge status: Home / Self Care | Payer: Medicaid Other | Source: Ambulatory Visit | Attending: Nurse Practitioner | Admitting: Nurse Practitioner

## 2023-03-16 DIAGNOSIS — N920 Excessive and frequent menstruation with regular cycle: Secondary | ICD-10-CM | POA: Insufficient documentation

## 2023-03-16 DIAGNOSIS — D252 Subserosal leiomyoma of uterus: Secondary | ICD-10-CM | POA: Diagnosis not present

## 2023-03-21 ENCOUNTER — Ambulatory Visit (HOSPITAL_COMMUNITY)
Admission: RE | Admit: 2023-03-21 | Discharge: 2023-03-21 | Disposition: A | Discharge status: Home / Self Care | Payer: Medicaid Other | Source: Ambulatory Visit | Attending: Nurse Practitioner | Admitting: Nurse Practitioner

## 2023-03-21 ENCOUNTER — Encounter (HOSPITAL_COMMUNITY): Payer: Self-pay

## 2023-03-21 DIAGNOSIS — R928 Other abnormal and inconclusive findings on diagnostic imaging of breast: Secondary | ICD-10-CM

## 2023-03-21 DIAGNOSIS — R92323 Mammographic fibroglandular density, bilateral breasts: Secondary | ICD-10-CM | POA: Diagnosis not present

## 2023-03-21 DIAGNOSIS — N6002 Solitary cyst of left breast: Secondary | ICD-10-CM | POA: Diagnosis not present

## 2023-03-22 DIAGNOSIS — Z130 Encounter for screening for diseases of the blood and blood-forming organs and certain disorders involving the immune mechanism: Secondary | ICD-10-CM | POA: Diagnosis not present

## 2023-03-22 DIAGNOSIS — Z13220 Encounter for screening for lipoid disorders: Secondary | ICD-10-CM | POA: Diagnosis not present

## 2023-03-22 DIAGNOSIS — N920 Excessive and frequent menstruation with regular cycle: Secondary | ICD-10-CM | POA: Diagnosis not present

## 2023-03-22 DIAGNOSIS — Z1329 Encounter for screening for other suspected endocrine disorder: Secondary | ICD-10-CM | POA: Diagnosis not present

## 2023-05-29 DIAGNOSIS — Z3169 Encounter for other general counseling and advice on procreation: Secondary | ICD-10-CM | POA: Diagnosis not present

## 2023-05-29 DIAGNOSIS — N920 Excessive and frequent menstruation with regular cycle: Secondary | ICD-10-CM | POA: Diagnosis not present

## 2023-05-29 DIAGNOSIS — Z3009 Encounter for other general counseling and advice on contraception: Secondary | ICD-10-CM | POA: Diagnosis not present

## 2023-06-21 ENCOUNTER — Encounter: Payer: Medicaid Other | Admitting: Obstetrics & Gynecology

## 2023-06-22 ENCOUNTER — Encounter: Payer: Medicaid Other | Admitting: Adult Health

## 2023-06-28 IMAGING — DX DG ANKLE COMPLETE 3+V*L*
3 series · 3 of 3 positions shown · non-contrast
Comparison: None.

CLINICAL DATA: Left ankle pain

EXAM:
LEFT ANKLE COMPLETE - 3+ VIEW

[ankle ap]
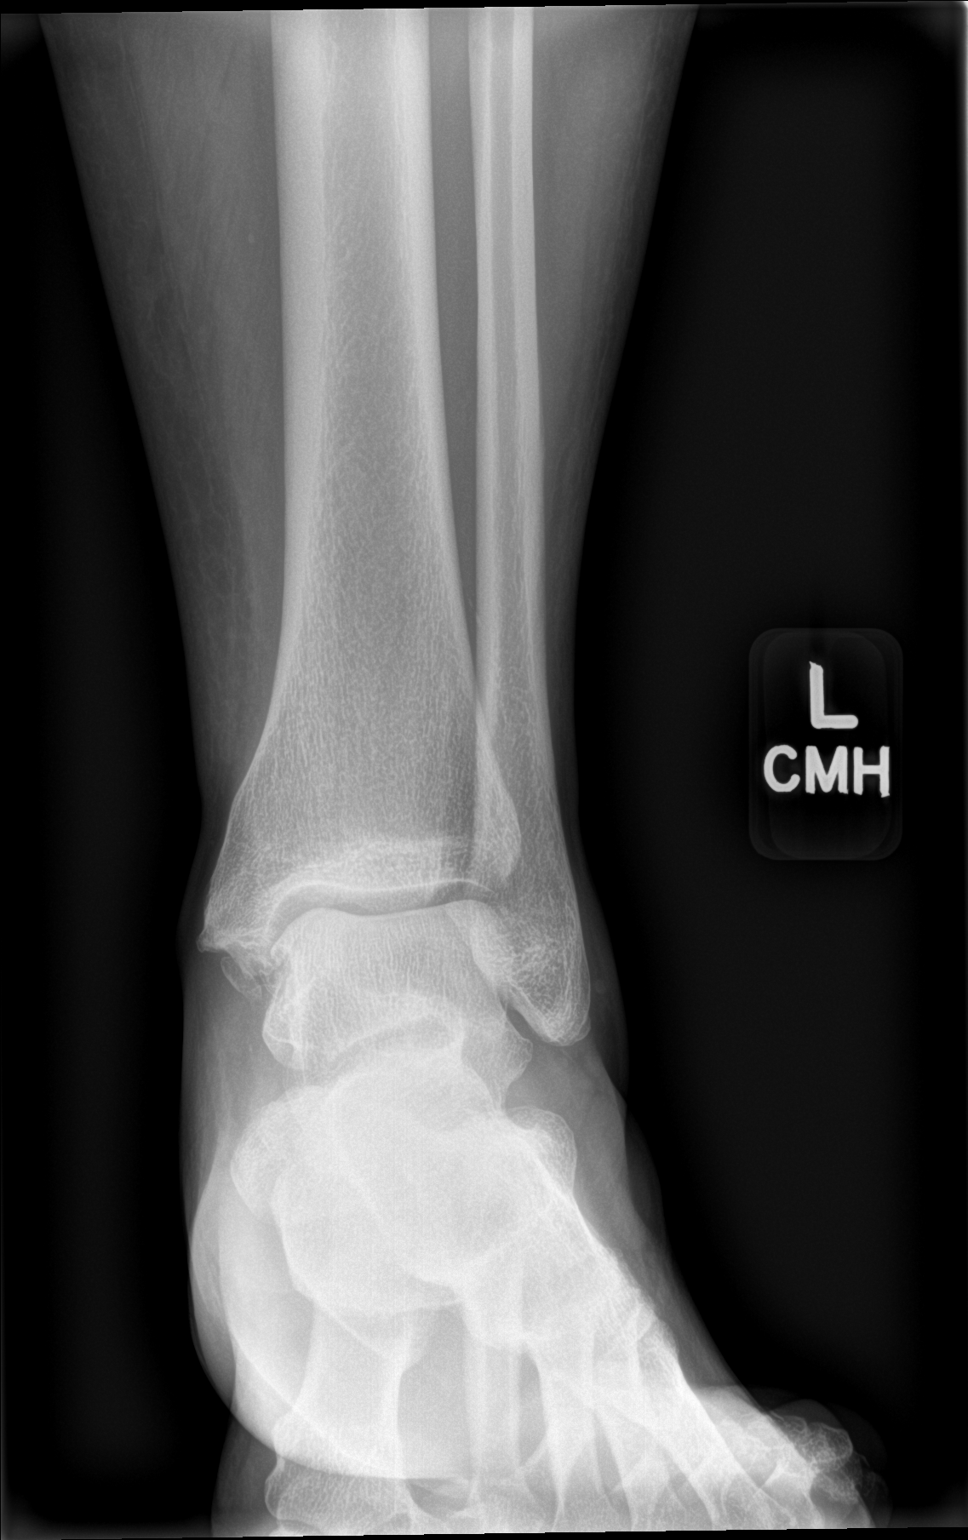

[ankle obl]
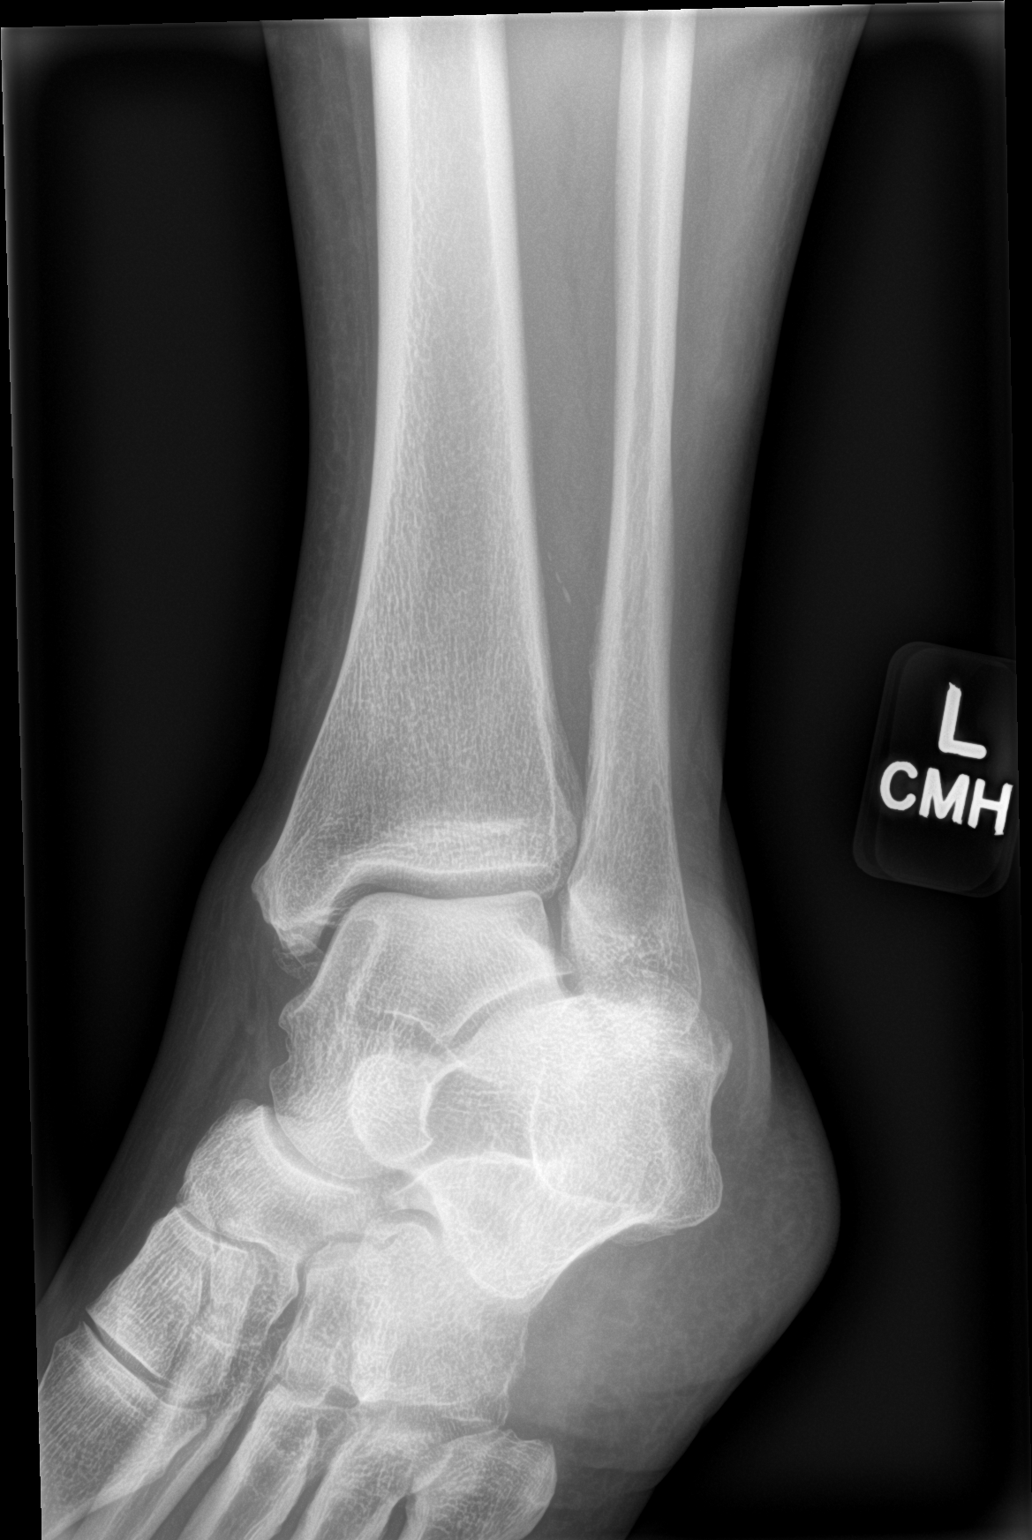

[ankle lat]
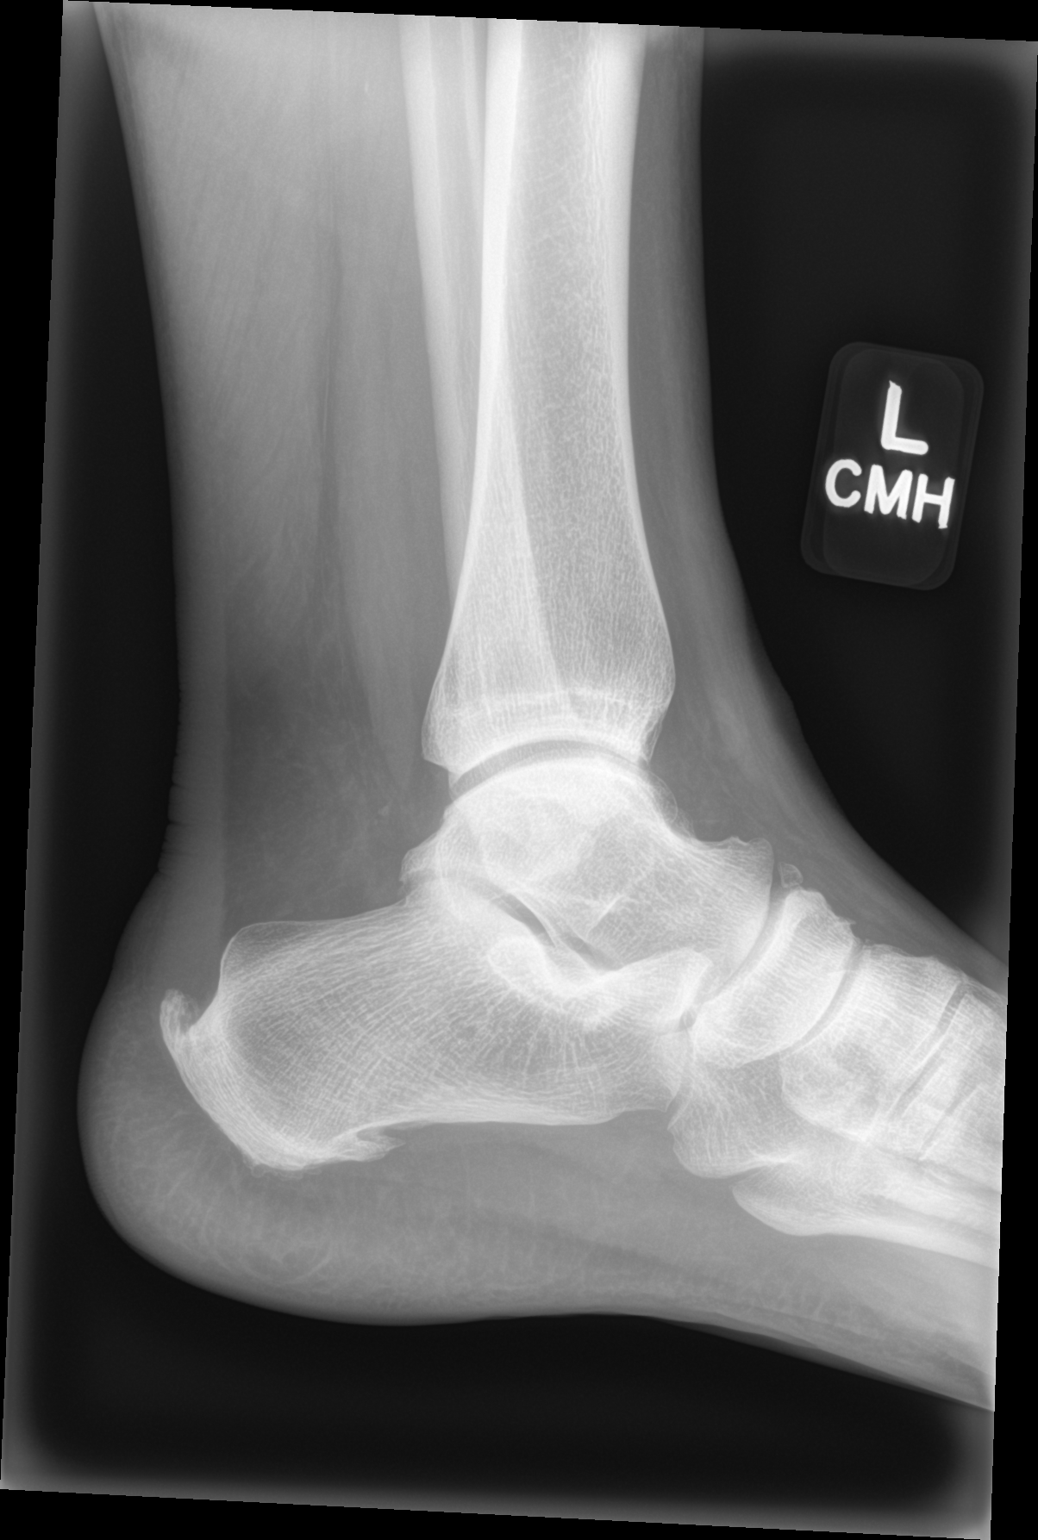

[3 of 3 positions shown; findings below may reference images not displayed]

FINDINGS: Normal alignment. No acute fracture or dislocation. Ankle mortise is
aligned. Accessory ossicle subjacent to the medial malleolus.
Moderate superior and plantar calcaneal spurs are present. Soft
tissues are unremarkable.
IMPRESSION: No acute abnormality.

Calcaneal spurring.

## 2023-07-04 ENCOUNTER — Encounter: Payer: Medicaid Other | Admitting: Adult Health

## 2023-07-06 ENCOUNTER — Encounter: Payer: Self-pay | Admitting: Obstetrics & Gynecology

## 2023-07-06 ENCOUNTER — Ambulatory Visit (INDEPENDENT_AMBULATORY_CARE_PROVIDER_SITE_OTHER): Payer: Medicaid Other | Admitting: Obstetrics & Gynecology

## 2023-07-06 VITALS — BP 137/87 | HR 80 | Ht 69.0 in | Wt 224.0 lb

## 2023-07-06 DIAGNOSIS — Z3490 Encounter for supervision of normal pregnancy, unspecified, unspecified trimester: Secondary | ICD-10-CM

## 2023-07-06 DIAGNOSIS — N926 Irregular menstruation, unspecified: Secondary | ICD-10-CM | POA: Diagnosis not present

## 2023-07-06 LAB — POCT URINE PREGNANCY: Preg Test, Ur: POSITIVE — AB

## 2023-07-06 MED ORDER — PRENATAL VITAMIN/MIN +DHA 27-0.8-200 MG PO CAPS: 1.0000 | ORAL_CAPSULE | Freq: Every day | ORAL | 11 refills | Status: DC

## 2023-07-06 MED ORDER — DOXYLAMINE-PYRIDOXINE 10-10 MG PO TBEC: 2.0000 | DELAYED_RELEASE_TABLET | Freq: Two times a day (BID) | ORAL | 3 refills | Status: DC

## 2023-07-06 NOTE — Progress Notes (Signed)
Follow up appointment for results: Small fibroid clinically irrelevant +UPT  Chief Complaint  Patient presents with   fibroid on u/s, now +UPT    Wants nausea medication    Blood pressure 137/87, pulse 80, height 5\' 9"  (1.753 m), weight 224 lb (101.6 kg), last menstrual period 05/16/2023.   EXAM: TRANSABDOMINAL AND TRANSVAGINAL ULTRASOUND OF PELVIS   TECHNIQUE: Both transabdominal and transvaginal ultrasound examinations of the pelvis were performed. Transabdominal technique was performed for global imaging of the pelvis including uterus, ovaries, adnexal regions, and pelvic cul-de-sac. It was necessary to proceed with endovaginal exam following the transabdominal exam to visualize the uterus endometrium ovaries.   COMPARISON:  None Available.   FINDINGS: Uterus   Measurements: 9.2 x 5.4 x 5.4 cm = volume: 140.4 mL. Small hypoechoic mass within the right anterior subserosal uterine corpus measuring 15 x 13 x 15 mm.   Endometrium   Thickness: 4.7 mm.  No focal abnormality visualized.   Right ovary   Measurements: 5.5 x 3.1 x 4.2 cm = volume: 37.9 mL. Normal appearance/no adnexal mass.   Left ovary   Measurements: 5 x 2.4 x 3 cm = volume: 18.7 mL. Normal appearance/no adnexal mass.   Other findings   No abnormal free fluid.   IMPRESSION: 1. Small uterine fibroid. 2. Endometrial thickness of 4.7 mm. If bleeding remains unresponsive to hormonal or medical therapy, sonohysterogram should be considered for focal lesion work-up. (Ref: Radiological Reasoning: Algorithmic Workup of Abnormal Vaginal Bleeding with Endovaginal Sonography and Sonohysterography. AJR 2008; 528:U13-24) 3. Enlarged appearing bilateral ovaries without suspicious mass.     Electronically Signed   By: Jasmine Pang M.D.   On: 03/16/2023 22:02   MEDS ordered this encounter: Meds ordered this encounter  Medications   Doxylamine-Pyridoxine 10-10 MG TBEC    Sig: Take 2 tablets by mouth 2  (two) times daily.    Dispense:  60 tablet    Refill:  3   Prenatal Vit-Fe Sulfate-FA-DHA (PRENATAL VITAMIN/MIN +DHA) 27-0.8-200 MG CAPS    Sig: Take 1 tablet by mouth daily.    Dispense:  100 capsule    Refill:  11    Orders for this encounter: Orders Placed This Encounter  Procedures   POCT urine pregnancy    Impression + Management Plan   ICD-10-CM   1. Early stage of pregnancy  Z34.90     2. Missed period  N92.6 POCT urine pregnancy      Follow Up: Return for make new OB visit.     All questions were answered.  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  Fibrocystic change.  Usual duct  epithelial hyperplasia.   CORONARY STENT INTERVENTION N/A 05/08/2019   Procedure: CORONARY STENT INTERVENTION;  Surgeon: Swazil[place], Peter M, MD;  Location: [facility] INVASIVE CV LAB;  Service: Cardiovascular;  Laterality: N/A;   FRACTURE SURGERY  2005   right ankle   HEMORRHOID SURGERY     HEMORRHOID SURGERY  2015   INCISION AND DRAINAGE BREAST  ABSCESS  09/2010   left breast    LEFT HEART CATH AND CORONARY ANGIOGRAPHY N/A 05/08/2019   Procedure: LEFT HEART CATH AND CORONARY ANGIOGRAPHY;  Surgeon: Swaziland, Peter M, MD;  Location: [facility] INVASIVE CV LAB;  Service: Cardiovascular;  Laterality: N/A;    OB History     Gravida  3   Para  3   Term  3   Preterm      AB      Living  3      SAB      IAB      Ectopic      Multiple      Live Births  3           Allergies  Allergen Reactions   Diclofenac Hives    Social History   Socioeconomic History   Marital  status: Single    Spouse name: Not on file   Number of children: 3   Years of education: 14   Highest education level: Associate degree: academic program  Occupational History   Occupation: Waitress  Tobacco Use   Smoking status: Former    Current packs/day: 0.00    Average packs/day: 0.1 packs/day for 26.0 years (2.6 ttl pk-yrs)    Types: Cigarettes    Start date: 09/15/1993    Quit date: 09/16/2019    Years since quitting: 3.8   Smokeless tobacco: Never  Vaping Use   Vaping status: Every Day   Substances: Nicotine  Substance and Sexual Activity   Alcohol use: No    Comment: quit 2018   Drug use: No   Sexual activity: Yes    Birth control/protection: None  Other Topics Concern   Not on file  Social History Narrative   Lives at home with her mother, husband and three daughters.   Right-handed.   Approximately 20 cups caffeine per day (4-5 glasses holding 32oz of tea).   Social Determinants of Health   Financial Resource Strain: Medium Risk (07/06/2023)   Overall Financial Resource Strain (CARDIA)    Difficulty of Paying Living Expenses: Somewhat hard  Food Insecurity: Food Insecurity Present (07/06/2023)   Hunger Vital Sign    Worried About Running Out of Food in the Last Year: Often true    Ran Out of Food in the Last Year: Often true  Transportation Needs: No Transportation Needs (07/06/2023)   PRAPARE - Administrator, Civil Service (Medical): No    Lack of Transportation (Non-Medical): No  Physical Activity: Sufficiently Active (07/06/2023)   Exercise Vital Sign    Days of Exercise per Week: 5 days    Minutes of Exercise per Session: 90 min  Stress: No Stress Concern Present (07/06/2023)   [employer] of Occupational Health - Occupational Stress Questionnaire    Feeling of Stress : Only a little  Social Connections: Moderately Isolated (07/06/2023)   Social Connection and Isolation Panel [NHANES]    Frequency of Communication with Friends and Family:  More than three times a week    Frequency of Social Gatherings with Friends and Family: More than three times a week    Attends Religious Services: Never    Database administrator or Organizations: No    Attends Banker Meetings: Never    Marital Status: Living with partner    Family History  Problem Relation Age of Onset   Diabetes Paternal Grandmother    Cervical cancer Maternal Grandmother    Heart disease Father  Cardiac arrest 2006   Diabetes Father    Hyperlipidemia Father    Stroke Father    Hypertension Mother    Peripheral Artery Disease Mother    Aneurysm Brother

## 2023-07-12 ENCOUNTER — Other Ambulatory Visit: Payer: Self-pay | Admitting: Obstetrics & Gynecology

## 2023-07-12 DIAGNOSIS — O3680X0 Pregnancy with inconclusive fetal viability, not applicable or unspecified: Secondary | ICD-10-CM

## 2023-07-13 ENCOUNTER — Ambulatory Visit (INDEPENDENT_AMBULATORY_CARE_PROVIDER_SITE_OTHER): Payer: Medicaid Other

## 2023-07-13 DIAGNOSIS — Z3481 Encounter for supervision of other normal pregnancy, first trimester: Secondary | ICD-10-CM | POA: Diagnosis not present

## 2023-07-13 DIAGNOSIS — O3680X0 Pregnancy with inconclusive fetal viability, not applicable or unspecified: Secondary | ICD-10-CM

## 2023-07-13 DIAGNOSIS — Z3A08 8 weeks gestation of pregnancy: Secondary | ICD-10-CM

## 2023-07-13 NOTE — Progress Notes (Addendum)
[place] 8+2 wks,single IUP with yolk sac,FHR 161 bpm,normal ovaries,CRL 13.63 mm

## 2023-08-04 ENCOUNTER — Ambulatory Visit: Payer: Medicaid Other | Admitting: Nurse Practitioner

## 2023-08-11 ENCOUNTER — Encounter: Payer: Self-pay | Admitting: Family Medicine

## 2023-08-11 ENCOUNTER — Ambulatory Visit: Payer: Medicaid Other | Admitting: Family Medicine

## 2023-08-11 VITALS — BP 134/88 | HR 57 | Temp 97.7°F | Ht 69.0 in | Wt 221.0 lb

## 2023-08-11 DIAGNOSIS — N898 Other specified noninflammatory disorders of vagina: Secondary | ICD-10-CM

## 2023-08-11 DIAGNOSIS — B3731 Acute candidiasis of vulva and vagina: Secondary | ICD-10-CM | POA: Diagnosis not present

## 2023-08-11 DIAGNOSIS — B9689 Other specified bacterial agents as the cause of diseases classified elsewhere: Secondary | ICD-10-CM

## 2023-08-11 LAB — WET PREP FOR TRICH, YEAST, CLUE
Clue Cell Exam: POSITIVE — AB
Trichomonas Exam: NEGATIVE
Yeast Exam: POSITIVE — AB

## 2023-08-11 MED ORDER — METRONIDAZOLE 500 MG PO TABS: 500.0000 mg | ORAL_TABLET | Freq: Two times a day (BID) | ORAL | 0 refills | Status: AC

## 2023-08-11 MED ORDER — MICONAZOLE NITRATE 2 % VA CREA: 1.0000 | TOPICAL_CREAM | Freq: Every day | VAGINAL | 0 refills | Status: AC

## 2023-08-11 NOTE — Progress Notes (Signed)
Acute Office Visit  Subjective:     Patient ID: Kaitlin Torres, female    DOB: 22-Nov-1980, 42 y.o.   MRN: [id number]  Chief Complaint  Patient presents with   Vaginal Discharge   vaginal odor    X 1 week     Vaginal Discharge The patient's primary symptoms include a genital odor and vaginal discharge. The patient's pertinent negatives include no genital itching, genital lesions, genital rash, pelvic pain or vaginal bleeding. This is a new problem. The current episode started in the past 7 days. The problem occurs constantly. The problem has been gradually worsening. The patient is experiencing no pain. She is pregnant. Pertinent negatives include no abdominal pain, anorexia, back pain, chills, constipation, diarrhea, discolored urine, dysuria, fever, flank pain, frequency, headaches, hematuria, joint pain, joint swelling, nausea, painful intercourse, rash, sore throat, urgency or vomiting. The vaginal discharge was malodorous. There has been no bleeding. The symptoms are aggravated by tactile pressure and urinating. She has tried nothing for the symptoms. No, her partner does not have an STD.     Review of Systems  Constitutional:  Negative for chills and fever.  HENT:  Negative for sore throat.   Gastrointestinal:  Negative for abdominal pain, anorexia, constipation, diarrhea, nausea and vomiting.  Genitourinary:  Positive for vaginal discharge. Negative for dysuria, flank pain, frequency, hematuria, pelvic pain and urgency.  Musculoskeletal:  Negative for back pain and joint pain.  Skin:  Negative for rash.  Neurological:  Negative for headaches.        Objective:    BP 134/88   Pulse (!) 57   Temp 97.7 F (36.5 C) (Temporal)   Ht 5\' 9"  (1.753 m)   Wt 221 lb (100.2 kg)   LMP 05/16/2023   SpO2 100%   BMI 32.64 kg/m    Physical Exam Vitals and nursing note reviewed.  Constitutional:      Appearance: Normal appearance.  HENT:     Head: Normocephalic and  atraumatic.     Nose: Nose normal.     Mouth/Throat:     Mouth: Mucous membranes are moist.  Eyes:     Conjunctiva/sclera: Conjunctivae normal.     Pupils: Pupils are equal, round, and reactive to light.  Cardiovascular:     Rate and Rhythm: Normal rate and regular rhythm.  Pulmonary:     Effort: Pulmonary effort is normal.     Breath sounds: Normal breath sounds.  Abdominal:     Palpations: Abdomen is soft.  Genitourinary:    Comments: Deferred, self wet prep Musculoskeletal:     Right lower leg: No edema.     Left lower leg: No edema.  Skin:    General: Skin is warm and dry.     Capillary Refill: Capillary refill takes less than 2 seconds.  Neurological:     General: No focal deficit present.     Mental Status: She is alert and oriented to person, place, and time.     Wet prep positive for yeast and clue cells.       Assessment & Plan:   Problem List Items Addressed This Visit   None Visit Diagnoses     Vaginal discharge    -  Primary   Relevant Orders   WET PREP FOR TRICH, YEAST, CLUE   Bacterial vaginosis in pregnancy       Relevant Medications   metroNIDAZOLE (FLAGYL) 500 MG tablet   Candida vaginitis       Relevant  Medications   metroNIDAZOLE (FLAGYL) 500 MG tablet   miconazole (MICONAZOLE 7) 2 % vaginal cream       Meds ordered this encounter  Medications   metroNIDAZOLE (FLAGYL) 500 MG tablet    Sig: Take 1 tablet (500 mg total) by mouth 2 (two) times daily for 7 days.    Dispense:  14 tablet    Refill:  0    Order Specific Question:   Supervising Provider    Answer:   Standley Brooking   miconazole (MICONAZOLE 7) 2 % vaginal cream    Sig: Place 1 Applicatorful vaginally at bedtime for 7 days.    Dispense:  45 g    Refill:  0    Order Specific Question:   Supervising Provider    Answer:   Mechele Claude [[id number]]    Return if symptoms worsen or fail to improve.  Kari Baars, FNP

## 2023-08-18 DIAGNOSIS — O0990 Supervision of high risk pregnancy, unspecified, unspecified trimester: Secondary | ICD-10-CM | POA: Insufficient documentation

## 2023-08-19 DIAGNOSIS — O0990 Supervision of high risk pregnancy, unspecified, unspecified trimester: Secondary | ICD-10-CM | POA: Diagnosis not present

## 2023-08-21 ENCOUNTER — Other Ambulatory Visit: Payer: Self-pay | Admitting: Obstetrics & Gynecology

## 2023-08-21 DIAGNOSIS — Z3682 Encounter for antenatal screening for nuchal translucency: Secondary | ICD-10-CM

## 2023-08-22 ENCOUNTER — Encounter: Payer: Medicaid Other | Admitting: *Deleted

## 2023-08-22 ENCOUNTER — Ambulatory Visit (INDEPENDENT_AMBULATORY_CARE_PROVIDER_SITE_OTHER): Payer: Medicaid Other

## 2023-08-22 ENCOUNTER — Encounter: Payer: Self-pay | Admitting: Women's Health

## 2023-08-22 ENCOUNTER — Ambulatory Visit (INDEPENDENT_AMBULATORY_CARE_PROVIDER_SITE_OTHER): Payer: Medicaid Other | Admitting: Women's Health

## 2023-08-22 VITALS — BP 131/86 | HR 71 | Wt 218.4 lb

## 2023-08-22 DIAGNOSIS — O0992 Supervision of high risk pregnancy, unspecified, second trimester: Secondary | ICD-10-CM

## 2023-08-22 DIAGNOSIS — Z3A14 14 weeks gestation of pregnancy: Secondary | ICD-10-CM

## 2023-08-22 DIAGNOSIS — I252 Old myocardial infarction: Secondary | ICD-10-CM

## 2023-08-22 DIAGNOSIS — O09521 Supervision of elderly multigravida, first trimester: Secondary | ICD-10-CM

## 2023-08-22 DIAGNOSIS — Z3682 Encounter for antenatal screening for nuchal translucency: Secondary | ICD-10-CM | POA: Diagnosis not present

## 2023-08-22 DIAGNOSIS — I10 Essential (primary) hypertension: Secondary | ICD-10-CM | POA: Diagnosis not present

## 2023-08-22 DIAGNOSIS — O10919 Unspecified pre-existing hypertension complicating pregnancy, unspecified trimester: Secondary | ICD-10-CM | POA: Insufficient documentation

## 2023-08-22 DIAGNOSIS — O0991 Supervision of high risk pregnancy, unspecified, first trimester: Secondary | ICD-10-CM | POA: Diagnosis not present

## 2023-08-22 DIAGNOSIS — Z363 Encounter for antenatal screening for malformations: Secondary | ICD-10-CM

## 2023-08-22 DIAGNOSIS — E282 Polycystic ovarian syndrome: Secondary | ICD-10-CM | POA: Diagnosis not present

## 2023-08-22 DIAGNOSIS — O09522 Supervision of elderly multigravida, second trimester: Secondary | ICD-10-CM

## 2023-08-22 DIAGNOSIS — Z31430 Encounter of female for testing for genetic disease carrier status for procreative management: Secondary | ICD-10-CM | POA: Diagnosis not present

## 2023-08-22 DIAGNOSIS — Z1332 Encounter for screening for maternal depression: Secondary | ICD-10-CM | POA: Diagnosis not present

## 2023-08-22 DIAGNOSIS — O09529 Supervision of elderly multigravida, unspecified trimester: Secondary | ICD-10-CM | POA: Insufficient documentation

## 2023-08-22 MED ORDER — ASPIRIN 81 MG PO TBEC: 162.0000 mg | DELAYED_RELEASE_TABLET | Freq: Every day | ORAL | 2 refills | Status: AC

## 2023-08-22 MED ORDER — BLOOD PRESSURE MONITOR MISC: 0 refills | Status: DC

## 2023-08-22 NOTE — Progress Notes (Signed)
[place] 14 wks,measurements c/w dates,NB present,NT 2 mm,normal ovaries,FHR 166 bpm,anterior placenta,CRL 74.08 mm

## 2023-08-22 NOTE — Patient Instructions (Signed)
Jenevieve, thank you for choosing our office today! We appreciate the opportunity to meet your healthcare needs. You may receive a short survey by mail, e-mail, or through [employer]. If you are happy with your care we would appreciate if you could take just a few minutes to complete the survey questions. We read all of your comments and take your feedback very seriously. Thank you again for choosing our office.  [facility] [employer] [facility] Team at [facility]  [facility] ([address] [place], [place] [address]) Entrance C, located off of [address] Free 24/7 valet parking   Nausea & Vomiting Have saltine crackers or pretzels by your bed and eat a few bites before you raise your head out of bed in the morning Eat small frequent meals throughout the day instead of large meals Drink plenty of fluids throughout the day to stay hydrated, just don't drink a lot of fluids with your meals.  This can make your stomach fill up faster making you feel sick Do not brush your teeth right after you eat Products with real ginger are good for nausea, like ginger ale and ginger hard candy Make sure it says made with real ginger! Sucking on sour candy like lemon heads is also good for nausea If your prenatal vitamins make you nauseated, take them at night so you will sleep through the nausea Sea Bands If you feel like you need medicine for the nausea & vomiting please let us know If you are unable to keep any fluids or food down please let us know   Constipation Drink plenty of fluid, preferably water, throughout the day Eat foods high in fiber such as fruits, vegetables, and grains Exercise, such as walking, is a good way to keep your bowels regular Drink warm fluids, especially warm prune juice, or decaf coffee Eat a 1/2 cup of real oatmeal (not instant), 1/2 cup applesauce, and 1/2-1 cup warm prune juice every day If needed, you may take Colace (docusate sodium) stool softener  once or twice a day to help keep the stool soft.  If you still are having problems with constipation, you may take Miralax once daily as needed to help keep your bowels regular.   Home Blood Pressure Monitoring for Patients   Your provider has recommended that you check your blood pressure (BP) at least once a week at home. If you do not have a blood pressure cuff at home, one will be provided for you. Contact your provider if you have not received your monitor within 1 week.   Helpful Tips for Accurate Home Blood Pressure Checks  Don't smoke, exercise, or drink caffeine 30 minutes before checking your BP Use the restroom before checking your BP (a full bladder can raise your pressure) Relax in a comfortable upright chair Feet on the ground Left arm resting comfortably on a flat surface at the level of your heart Legs uncrossed Back supported Sit quietly and don't talk Place the cuff on your bare arm Adjust snuggly, so that only two fingertips can fit between your skin and the top of the cuff Check 2 readings separated by at least one minute Keep a log of your BP readings For a visual, please reference this diagram: http://ccnc.care/bpdiagram  Provider Name: [facility]     Phone: [phone]  Zone 1: ALL CLEAR  Continue to monitor your symptoms:  BP reading is less than 140 (top number) or less than 90 (bottom  number)  No right upper stomach pain No headaches or seeing spots No feeling nauseated or throwing up No swelling in face and hands  Zone 2: CAUTION Call your doctor's office for any of the following:  BP reading is greater than 140 (top number) or greater than 90 (bottom number)  Stomach pain under your ribs in the middle or right side Headaches or seeing spots Feeling nauseated or throwing up Swelling in face and hands  Zone 3: EMERGENCY  Seek immediate medical care if you have any of the following:  BP reading is greater than160 (top number) or greater than  110 (bottom number) Severe headaches not improving with Tylenol Serious difficulty catching your breath Any worsening symptoms from Zone 2    First Trimester of Pregnancy The first trimester of pregnancy is from week 1 until the end of week 12 (months 1 through 3). A week after a sperm fertilizes an egg, the egg will implant on the wall of the uterus. This embryo will begin to develop into a baby. Genes from you and your partner are forming the baby. The female genes determine whether the baby is a boy or a girl. At 6-8 weeks, the eyes and face are formed, and the heartbeat can be seen on ultrasound. At the end of 12 weeks, all the baby's organs are formed.  Now that you are pregnant, you will want to do everything you can to have a healthy baby. Two of the most important things are to get good prenatal care and to follow your health care provider's instructions. Prenatal care is all the medical care you receive before the baby's birth. This care will help prevent, find, and treat any problems during the pregnancy and childbirth. BODY CHANGES Your body goes through many changes during pregnancy. The changes vary from woman to woman.  You may gain or lose a couple of pounds at first. You may feel sick to your stomach (nauseous) and throw up (vomit). If the vomiting is uncontrollable, call your health care provider. You may tire easily. You may develop headaches that can be relieved by medicines approved by your health care provider. You may urinate more often. Painful urination may mean you have a bladder infection. You may develop heartburn as a result of your pregnancy. You may develop constipation because certain hormones are causing the muscles that push waste through your intestines to slow down. You may develop hemorrhoids or swollen, bulging veins (varicose veins). Your breasts may begin to grow larger and become tender. Your nipples may stick out more, and the tissue that surrounds them  (areola) may become darker. Your gums may bleed and may be sensitive to brushing and flossing. Dark spots or blotches (chloasma, mask of pregnancy) may develop on your face. This will likely fade after the baby is born. Your menstrual periods will stop. You may have a loss of appetite. You may develop cravings for certain kinds of food. You may have changes in your emotions from day to day, such as being excited to be pregnant or being concerned that something may go wrong with the pregnancy and baby. You may have more vivid and strange dreams. You may have changes in your hair. These can include thickening of your hair, rapid growth, and changes in texture. Some women also have hair loss during or after pregnancy, or hair that feels dry or thin. Your hair will most likely return to normal after your baby is born. WHAT TO EXPECT AT YOUR PRENATAL  VISITS During a routine prenatal visit: You will be weighed to make sure you and the baby are growing normally. Your blood pressure will be taken. Your abdomen will be measured to track your baby's growth. The fetal heartbeat will be listened to starting around week 10 or 12 of your pregnancy. Test results from any previous visits will be discussed. Your health care provider may ask you: How you are feeling. If you are feeling the baby move. If you have had any abnormal symptoms, such as leaking fluid, bleeding, severe headaches, or abdominal cramping. If you have any questions. Other tests that may be performed during your first trimester include: Blood tests to find your blood type and to check for the presence of any previous infections. They will also be used to check for low iron levels (anemia) and Rh antibodies. Later in the pregnancy, blood tests for diabetes will be done along with other tests if problems develop. Urine tests to check for infections, diabetes, or protein in the urine. An ultrasound to confirm the proper growth and development  of the baby. An amniocentesis to check for possible genetic problems. Fetal screens for spina bifida and Down syndrome. You may need other tests to make sure you and the baby are doing well. HOME CARE INSTRUCTIONS  Medicines Follow your health care provider's instructions regarding medicine use. Specific medicines may be either safe or unsafe to take during pregnancy. Take your prenatal vitamins as directed. If you develop constipation, try taking a stool softener if your health care provider approves. Diet Eat regular, well-balanced meals. Choose a variety of foods, such as meat or vegetable-based protein, fish, milk and low-fat dairy products, vegetables, fruits, and whole grain breads and cereals. Your health care provider will help you determine the amount of weight gain that is right for you. Avoid raw meat and uncooked cheese. These carry germs that can cause birth defects in the baby. Eating four or five small meals rather than three large meals a day may help relieve nausea and vomiting. If you start to feel nauseous, eating a few soda crackers can be helpful. Drinking liquids between meals instead of during meals also seems to help nausea and vomiting. If you develop constipation, eat more high-fiber foods, such as fresh vegetables or fruit and whole grains. Drink enough fluids to keep your urine clear or pale yellow. Activity and Exercise Exercise only as directed by your health care provider. Exercising will help you: Control your weight. Stay in shape. Be prepared for labor and delivery. Experiencing pain or cramping in the lower abdomen or low back is a good sign that you should stop exercising. Check with your health care provider before continuing normal exercises. Try to avoid standing for long periods of time. Move your legs often if you must stand in one place for a long time. Avoid heavy lifting. Wear low-heeled shoes, and practice good posture. You may continue to have sex  unless your health care provider directs you otherwise. Relief of Pain or Discomfort Wear a good support bra for breast tenderness.   Take warm sitz baths to soothe any pain or discomfort caused by hemorrhoids. Use hemorrhoid cream if your health care provider approves.   Rest with your legs elevated if you have leg cramps or low back pain. If you develop varicose veins in your legs, wear support hose. Elevate your feet for 15 minutes, 3-4 times a day. Limit salt in your diet. Prenatal Care Schedule your prenatal visits by the  twelfth week of pregnancy. They are usually scheduled monthly at first, then more often in the last 2 months before delivery. Write down your questions. Take them to your prenatal visits. Keep all your prenatal visits as directed by your health care provider. Safety Wear your seat belt at all times when driving. Make a list of emergency phone numbers, including numbers for family, friends, the hospital, and police and fire departments. General Tips Ask your health care provider for a referral to a local prenatal education class. Begin classes no later than at the beginning of month 6 of your pregnancy. Ask for help if you have counseling or nutritional needs during pregnancy. Your health care provider can offer advice or refer you to specialists for help with various needs. Do not use hot tubs, steam rooms, or saunas. Do not douche or use tampons or scented sanitary pads. Do not cross your legs for long periods of time. Avoid cat litter boxes and soil used by cats. These carry germs that can cause birth defects in the baby and possibly loss of the fetus by miscarriage or stillbirth. Avoid all smoking, herbs, alcohol, and medicines not prescribed by your health care provider. Chemicals in these affect the formation and growth of the baby. Schedule a dentist appointment. At home, brush your teeth with a soft toothbrush and be gentle when you floss. SEEK MEDICAL CARE IF:   You have dizziness. You have mild pelvic cramps, pelvic pressure, or nagging pain in the abdominal area. You have persistent nausea, vomiting, or diarrhea. You have a bad smelling vaginal discharge. You have pain with urination. You notice increased swelling in your face, hands, legs, or ankles. SEEK IMMEDIATE MEDICAL CARE IF:  You have a fever. You are leaking fluid from your vagina. You have spotting or bleeding from your vagina. You have severe abdominal cramping or pain. You have rapid weight gain or loss. You vomit blood or material that looks like coffee grounds. You are exposed to [place] measles and have never had them. You are exposed to fifth disease or chickenpox. You develop a severe headache. You have shortness of breath. You have any kind of trauma, such as from a fall or a car accident. Document Released: 10/18/2001 Document Revised: 03/10/2014 Document Reviewed: 09/03/2013 [facility] Patient Information 2015 [place], [place]. This information is not intended to replace advice given to you by your health care provider. Make sure you discuss any questions you have with your health care provider.

## 2023-08-22 NOTE — Progress Notes (Signed)
INITIAL OBSTETRICAL VISIT Patient name: Kaitlin Torres MRN [id number]  Date of birth: 12/02/1980 Chief Complaint:   Initial Prenatal Visit  History of Present Illness:   Kaitlin Torres is a 42 y.o. G71P3003 female at [redacted]w[redacted]d by LMP c/w u/s at 7 weeks with an Estimated Date of Delivery: 02/20/24 being seen today for her initial obstetrical visit.   Patient's last menstrual period was 05/16/2023. Her obstetrical history is significant for  term uncomplicated SVB x 3 . CHTN on labetalol 100mg  BID prior to pregnancy H/O MI 2020, takes 81mg  baby ASA daily, last saw cardiology May 2022 prior to breast surgery (excision of Rt single discharging duct and breast tissue surrounding it)  Today she reports no complaints.  Last pap 2020. Results were:  neg in [place], pt declines today-wants to do next visit     08/22/2023    2:45 PM 07/06/2023    3:48 PM 12/22/2022    3:55 PM 11/01/2022    9:36 AM 05/17/2022   11:49 AM  Depression screen PHQ 2/9  Decreased Interest 0 0 0 0 0  Down, Depressed, Hopeless 0 0 0 0 0  PHQ - 2 Score 0 0 0 0 0  Altered sleeping 3 0 0 0 0  Tired, decreased energy 3 3 0 0 0  Change in appetite 0 0 0 0 0  Feeling bad or failure about yourself  0 0 0 0 0  Trouble concentrating 0 0 0 0 0  Moving slowly or fidgety/restless 0 0 0 0 0  Suicidal thoughts 0 0 0 0 0  PHQ-9 Score 6 3 0 0 0  Difficult doing work/chores   Not difficult at all Not difficult at all Not difficult at all        08/22/2023    2:45 PM 07/06/2023    3:48 PM 12/22/2022    3:56 PM 11/01/2022    9:37 AM  GAD 7 : Generalized Anxiety Score  Nervous, Anxious, on Edge 0 0 0   Control/stop worrying 0 0 0   Worry too much - different things 0 0 0   Trouble relaxing 0 0 0   Restless 0 0 1 1  Easily annoyed or irritable 0 2 0   Afraid - awful might happen 0 0 0   Total GAD 7 Score 0 2 1   Anxiety Difficulty   Not difficult at all      Review of Systems:   Pertinent items are noted in HPI Denies  cramping/contractions, leakage of fluid, vaginal bleeding, abnormal vaginal discharge w/ itching/odor/irritation, headaches, visual changes, shortness of breath, chest pain, abdominal pain, severe nausea/vomiting, or problems with urination or bowel movements unless otherwise stated above.  Pertinent History Reviewed:  Reviewed past medical,surgical, social, obstetrical and family history.  Reviewed problem list, medications and allergies. OB History  Gravida Para Term Preterm AB Living  4 3 3     3   SAB IAB Ectopic Multiple Live Births          3    # Outcome Date GA Lbr Len/2nd Weight Sex Type Anes PTL Lv  4 Current           3 Term 2010   6 lb (2.722 kg) F Vag-Spont   LIV  2 Term 2004   6 lb (2.722 kg) F Vag-Spont   LIV  1 Term 2002   6 lb (2.722 kg) F Vag-Spont  N LIV   Physical Assessment:  Vitals:   08/22/23 1349  BP: 131/86  Pulse: 71  Weight: 218 lb 6.4 oz (99.1 kg)  Body mass index is 32.25 kg/m.       Physical Examination:  General appearance - well appearing, and in no distress  Mental status - alert, oriented to person, place, and time  Psych:  She has a normal mood and affect  Skin - warm and dry, normal color, no suspicious lesions noted  Chest - effort normal, all lung fields clear to auscultation bilaterally  Heart - normal rate and regular rhythm  Abdomen - soft, nontender  Extremities:  No swelling or varicosities noted  Thin prep pap is not done-declined by pt  Chaperone: N/A    TODAY'S NT [place] 14 wks,measurements c/w dates,NB present,NT 2 mm,normal ovaries,FHR 166 bpm,anterior placenta,CRL 74.08 mm   No results found for this or any previous visit (from the past 24 hour(s)).  Assessment & Plan:  1) High-Risk Pregnancy G4P3003 at [redacted]w[redacted]d with an Estimated Date of Delivery: 02/20/24   2) Initial OB visit  3) AMA 42yo  4) CHTN> on labetalol 100mg  BID, ASA 81mg  (increase to 162mg ), get baseline labs today  5) H/O MI 2020> received 2 drug-eluting stents  to Rt coronary artery, last appt w/ cardiology in 2022, referral to OB cards placed  6) Past due for pap> declines today, wants to do next visit  Meds:  Meds ordered this encounter  Medications   Blood Pressure Monitor MISC    Sig: For regular home bp monitoring during pregnancy    Dispense:  1 each    Refill:  0    O09.91 Please mail to patient   aspirin EC 81 MG tablet    Sig: Take 2 tablets (162 mg total) by mouth daily. Swallow whole.    Dispense:  180 tablet    Refill:  2    Initial labs obtained Continue prenatal vitamins Reviewed n/v relief measures and warning s/s to report Reviewed recommended weight gain based on pre-gravid BMI Encouraged well-balanced diet Genetic & carrier screening discussed: requests Panorama, NT/IT, and Horizon  Ultrasound discussed; fetal survey: requested CCNC completed> form faxed if has or is planning to apply for medicaid The nature of [employer] for [employer] with multiple MDs and other Advanced Practice Providers was explained to patient; also emphasized that fellows, residents, and students are part of our team. Does not have home bp cuff. Office bp cuff given: no. Rx sent: yes. Check bp weekly, let us know if consistently >140/90.   Indications for ASA therapy (per uptodate) One of the following: CHTN Yes  Indications for early A1C (per uptodate) BMI >=25 (>=23 in Asian women) AND one of the following First-degree relative with diabetes Yes History of cardiovascular disease Yes HTN or on therapy for hypertension Yes PCOS Yes >= 40yo Yes  Follow-up: Return in about 4 weeks (around 09/19/2023) for HROB w/ pap, [id number], MD only, in person.   Orders Placed This Encounter  Procedures   Urine Culture   GC/Chlamydia Probe Amp   US OB Comp + 14 Wk   Integrated 1   Hemoglobin A1c   PANORAMA PRENATAL TEST   CBC/D/Plt+RPR+Rh+ABO+RubIgG...   Comprehensive metabolic panel   Protein / creatinine ratio, urine    HORIZON CUSTOM   AMB Referral to Cardio Obstetrics    Cheral Marker CNM, [facility] 08/22/2023 3:19 PM

## 2023-08-23 DIAGNOSIS — I10 Essential (primary) hypertension: Secondary | ICD-10-CM | POA: Diagnosis not present

## 2023-08-23 DIAGNOSIS — Z3A14 14 weeks gestation of pregnancy: Secondary | ICD-10-CM | POA: Diagnosis not present

## 2023-08-23 DIAGNOSIS — O0991 Supervision of high risk pregnancy, unspecified, first trimester: Secondary | ICD-10-CM | POA: Diagnosis not present

## 2023-08-24 LAB — URINE CULTURE

## 2023-08-24 LAB — GC/CHLAMYDIA PROBE AMP
Chlamydia trachomatis, NAA: NEGATIVE
Neisseria Gonorrhoeae by PCR: NEGATIVE

## 2023-08-25 ENCOUNTER — Other Ambulatory Visit: Payer: Self-pay | Admitting: *Deleted

## 2023-08-25 DIAGNOSIS — Z3480 Encounter for supervision of other normal pregnancy, unspecified trimester: Secondary | ICD-10-CM | POA: Diagnosis not present

## 2023-08-25 DIAGNOSIS — Z3A14 14 weeks gestation of pregnancy: Secondary | ICD-10-CM

## 2023-08-26 LAB — GLUCOSE TOLERANCE, 2 HOURS W/ 1HR
Glucose, 1 hour: 173 mg/dL (ref 70–179)
Glucose, 2 hour: 102 mg/dL (ref 70–152)
Glucose, Fasting: 89 mg/dL (ref 70–91)

## 2023-08-28 ENCOUNTER — Encounter: Payer: Self-pay | Admitting: Women's Health

## 2023-08-28 DIAGNOSIS — O360990 Maternal care for other rhesus isoimmunization, unspecified trimester, not applicable or unspecified: Secondary | ICD-10-CM | POA: Insufficient documentation

## 2023-08-28 LAB — CBC/D/PLT+RPR+RH+ABO+RUBIGG...
Basophils Absolute: 0 10*3/uL (ref 0.0–0.2)
Basos: 1 %
EOS (ABSOLUTE): 0.1 10*3/uL (ref 0.0–0.4)
Eos: 1 %
HCV Ab: NONREACTIVE
HIV Screen 4th Generation wRfx: NONREACTIVE
Hematocrit: 39.6 % (ref 34.0–46.6)
Hemoglobin: 12.1 g/dL (ref 11.1–15.9)
Hepatitis B Surface Ag: NEGATIVE
Immature Grans (Abs): 0 10*3/uL (ref 0.0–0.1)
Immature Granulocytes: 1 %
Lymphocytes Absolute: 1.9 10*3/uL (ref 0.7–3.1)
Lymphs: 24 %
MCH: 22 pg — ABNORMAL LOW (ref 26.6–33.0)
MCHC: 30.6 g/dL — ABNORMAL LOW (ref 31.5–35.7)
MCV: 72 fL — ABNORMAL LOW (ref 79–97)
Monocytes Absolute: 0.5 10*3/uL (ref 0.1–0.9)
Monocytes: 7 %
Neutrophils Absolute: 5.5 10*3/uL (ref 1.4–7.0)
Neutrophils: 66 %
Platelets: 278 10*3/uL (ref 150–450)
RBC: 5.49 x10E6/uL — ABNORMAL HIGH (ref 3.77–5.28)
RDW: 18.6 % — ABNORMAL HIGH (ref 11.7–15.4)
RPR Ser Ql: NONREACTIVE
Rh Factor: POSITIVE
Rubella Antibodies, IGG: 7.85 {index} (ref >0.99)
WBC: 8.2 10*3/uL (ref 3.4–10.8)

## 2023-08-28 LAB — AB SCR+ANTIBODY ID

## 2023-08-28 LAB — COMPREHENSIVE METABOLIC PANEL
ALT: 14 [IU]/L (ref 0–32)
AST: 12 [IU]/L (ref 0–40)
Albumin: 4 g/dL (ref 3.9–4.9)
Alkaline Phosphatase: 46 [IU]/L (ref 44–121)
BUN/Creatinine Ratio: 11 (ref 9–23)
BUN: 5 mg/dL — ABNORMAL LOW (ref 6–24)
Bilirubin Total: 0.3 mg/dL (ref 0.0–1.2)
CO2: 21 mmol/L (ref 20–29)
Calcium: 9.3 mg/dL (ref 8.7–10.2)
Chloride: 105 mmol/L (ref 96–106)
Creatinine, Ser: 0.47 mg/dL — ABNORMAL LOW (ref 0.57–1.00)
Globulin, Total: 2.2 g/dL (ref 1.5–4.5)
Glucose: 89 mg/dL (ref 70–99)
Potassium: 3.9 mmol/L (ref 3.5–5.2)
Sodium: 136 mmol/L (ref 134–144)
Total Protein: 6.2 g/dL (ref 6.0–8.5)
eGFR: 123 mL/min/{1.73_m2} (ref >59)

## 2023-08-28 LAB — PROTEIN / CREATININE RATIO, URINE
Creatinine, Urine: 199.8 mg/dL
Protein, Ur: 15.6 mg/dL
Protein/Creat Ratio: 78 mg/g{creat} (ref 0–200)

## 2023-08-28 LAB — INTEGRATED 1
Crown Rump Length: 74.1 mm
Gest. Age on Collection Date: 13.4 wk
Maternal Age at EDD: 41.6 a
Nuchal Translucency (NT): 2 mm
Number of Fetuses: 1
PAPP-A Value: 1295.3 ng/mL
Sonographer ID#: 309760
Weight: 218 [lb_av]

## 2023-08-28 LAB — HCV INTERPRETATION

## 2023-08-28 LAB — HEMOGLOBIN A1C
Est. average glucose Bld gHb Est-mCnc: 117 mg/dL
Hgb A1c MFr Bld: 5.7 % — ABNORMAL HIGH (ref 4.8–5.6)

## 2023-08-30 ENCOUNTER — Encounter: Payer: Self-pay | Admitting: Women's Health

## 2023-09-03 LAB — HORIZON CUSTOM: REPORT SUMMARY: POSITIVE — AB

## 2023-09-04 ENCOUNTER — Encounter: Payer: Self-pay | Admitting: Women's Health

## 2023-09-04 DIAGNOSIS — D563 Thalassemia minor: Secondary | ICD-10-CM | POA: Insufficient documentation

## 2023-09-04 LAB — PANORAMA PRENATAL TEST FULL PANEL:PANORAMA TEST PLUS 5 ADDITIONAL MICRODELETIONS: FETAL FRACTION: 3.6

## 2023-09-11 ENCOUNTER — Encounter: Payer: Self-pay | Admitting: Cardiology

## 2023-09-11 ENCOUNTER — Ambulatory Visit: Payer: Medicaid Other | Attending: Cardiology | Admitting: Cardiology

## 2023-09-11 VITALS — BP 104/76 | HR 96 | Ht 69.0 in | Wt 222.6 lb

## 2023-09-11 DIAGNOSIS — O0992 Supervision of high risk pregnancy, unspecified, second trimester: Secondary | ICD-10-CM

## 2023-09-11 DIAGNOSIS — Z3A16 16 weeks gestation of pregnancy: Secondary | ICD-10-CM | POA: Diagnosis not present

## 2023-09-11 DIAGNOSIS — O09522 Supervision of elderly multigravida, second trimester: Secondary | ICD-10-CM

## 2023-09-11 DIAGNOSIS — I10 Essential (primary) hypertension: Secondary | ICD-10-CM

## 2023-09-11 DIAGNOSIS — I2510 Atherosclerotic heart disease of native coronary artery without angina pectoris: Secondary | ICD-10-CM | POA: Diagnosis not present

## 2023-09-11 DIAGNOSIS — R0609 Other forms of dyspnea: Secondary | ICD-10-CM | POA: Diagnosis not present

## 2023-09-11 NOTE — Progress Notes (Signed)
Cardio-Obstetrics Clinic  New Evaluation  Date:  09/13/2023   ID:  Kaitlin Torres, DOB 10/29/1981, MRN [id number]  PCP:  Sonny Masters, FNP   [facility] Providers Cardiologist:  Thomasene Ripple, DO  Electrophysiologist:  None       Referring MD: Cheral Marker, CNM   Chief Complaint: " I am ok"  History of Present Illness:    Kaitlin Torres is a 42 y.o. female [G4P3003] who is being seen today for the evaluation of hx of coronary artery disease currently pregnant at the request of Cheral Marker, CNM.   She was last seen on our office in August of 2022, at that time she was stable from a CV standpoint.   Medical hx include CAD s/p PCI to the RCA and OM vessels in 2020, Familial hypercholesteremia, obesity, Tobacco use/vapes, obesity, hypertension.   She is currently 16 weeks 6 days pregnant. This is her fourth pregnancy, but the first since her cardiac event. Her previous pregnancies were uncomplicated and resulted in vaginal deliveries. She denies a personal history of diabetes but reports a family history of early-onset heart disease in her father.  Currently, she is experiencing fatigue, shortness of breath, and numbness in her hands. 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 She denies any chest pain and confirms adherence to her aspirin therapy. She is not currently on a statin, which was held due to her pregnancy.   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  Fibrocystic change.  Usual duct  epithelial hyperplasia.   CORONARY STENT INTERVENTION N/A 05/08/2019   Procedure: CORONARY STENT INTERVENTION;  Surgeon: Swaziland, Peter M, MD;  Location: [facility] INVASIVE CV LAB;  Service: Cardiovascular;  Laterality: N/A;   FRACTURE SURGERY  2005   right ankle   HEMORRHOID SURGERY     HEMORRHOID SURGERY  2015   INCISION AND DRAINAGE BREAST ABSCESS  09/2010   left breast    LEFT HEART CATH AND CORONARY ANGIOGRAPHY N/A 05/08/2019   Procedure: LEFT HEART CATH AND CORONARY ANGIOGRAPHY;  Surgeon: Swaziland, Peter M, MD;  Location[facility] INVASIVE CV LAB;  Service: Cardiovascular;  Laterality: N/A;      OB History     Gravida  4   Para  3   Term  3   Preterm      AB      Living  3      SAB      IAB      Ectopic      Multiple      Live Births  3               Current Medications: Current Meds  Medication Sig   aspirin EC 81 MG tablet Take 2 tablets (162 mg total) by mouth daily. Swallow whole.   labetalol (NORMODYNE) 100 MG  tablet Take 1 tablet (100 mg total) by mouth 2 (two) times daily.   Prenatal Vit-Fe Sulfate-FA-DHA (PRENATAL VITAMIN/MIN +DHA) 27-0.8-200 MG CAPS Take 1 tablet by mouth daily.     Allergies:   Diclofenac   Social History   Socioeconomic History   Marital status: Single    Spouse name: Not on file   Number of children: 3   Years of education: 14   Highest education level: Associate degree: academic program  Occupational History   Occupation: Waitress  Tobacco Use   Smoking status: Former    Current packs/day: 0.00    Average packs/day: 0.1 packs/day for 26.0 years (2.6 ttl pk-yrs)    Types: Cigarettes    Start date: 09/15/1993    Quit date: 09/16/2019    Years since quitting: 3.9   Smokeless tobacco: Never  Vaping Use   Vaping status: Every Day   Substances: Nicotine  Substance and Sexual Activity   Alcohol use: No    Comment: quit 2018    Drug use: No   Sexual activity: Yes    Birth control/protection: None  Other Topics Concern   Not on file  Social History Narrative   Lives at home with her mother, husband and three daughters.   Right-handed.   Approximately 20 cups caffeine per day (4-5 glasses holding 32oz of tea).   Social Determinants of Health   Financial Resource Strain: Medium Risk (07/06/2023)   Overall Financial Resource Strain (CARDIA)    Difficulty of Paying Living Expenses: Somewhat hard  Food Insecurity: Food Insecurity Present (07/06/2023)   Hunger Vital Sign    Worried About Running Out of Food in the Last Year: Often true    Ran Out of Food in the Last Year: Often true  Transportation Needs: No Transportation Needs (07/06/2023)   PRAPARE - Administrator, Civil Service (Medical): No    Lack of Transportation (Non-Medical): No  Physical Activity: Sufficiently Active (07/06/2023)   Exercise Vital Sign    Days of Exercise per Week: 5 days    Minutes of Exercise per Session: 90 min  Stress: No Stress Concern Present (07/06/2023)   [employer] of Occupational Health - Occupational Stress Questionnaire    Feeling of Stress : Only a little  Social Connections: Moderately Isolated (07/06/2023)   Social Connection and Isolation Panel [NHANES]    Frequency of Communication with Friends and Family: More than three times a week    Frequency of Social Gatherings with Friends and Family: More than three times a week    Attends Religious Services: Never    Database administrator or Organizations: No    Attends Engineer, structural: Never    Marital Status: Living with partner      Family History  Problem Relation Age of Onset   Hypertension Mother    Peripheral Artery Disease Mother    Heart disease Father        Cardiac arrest 2006   Diabetes Father    Hyperlipidemia Father    Stroke Father    Aneurysm Brother    Cervical cancer Maternal Grandmother    Diabetes Paternal  Grandmother       ROS:   Please see the history of present illness.     All other systems reviewed and are negative.   Labs/EKG Reviewed:    EKG:   EKG was ordered today.  The ekg ordered today demonstrates sinus rhythm, heart rate 96 bpm  Recent Labs: 11/01/2022:  TSH 2.400 02/05/2023: B Natriuretic Peptide 16.0 08/23/2023: ALT 14; BUN 5; Creatinine, Ser 0.47; Hemoglobin 12.1; Platelets 278; Potassium 3.9; Sodium 136   Recent Lipid Panel Lab Results  Component Value Date/Time   CHOL 202 (H) 11/01/2022 09:49 AM   TRIG 103 11/01/2022 09:49 AM   HDL 36 (L) 11/01/2022 09:49 AM   CHOLHDL 5.6 (H) 11/01/2022 09:49 AM   CHOLHDL 10.0 05/09/2019 03:57 AM   LDLCALC 147 (H) 11/01/2022 09:49 AM    Physical Exam:    VS:  BP 104/76 (BP Location: Left Arm, Patient Position: Sitting, Cuff Size: Normal)   Pulse 96   Ht 5\' 9"  (1.753 m)   Wt 222 lb 9.6 oz (101 kg)   LMP 05/16/2023   SpO2 99%   BMI 32.87 kg/m     Wt Readings from Last 3 Encounters:  09/11/23 222 lb 9.6 oz (101 kg)  08/22/23 218 lb 6.4 oz (99.1 kg)  08/11/23 221 lb (100.2 kg)     GEN:  Well nourished, well developed in no acute distress HEENT: Normal NECK: No JVD; No carotid bruits LYMPHATICS: No lymphadenopathy CARDIAC: RRR, no murmurs, rubs, gallops RESPIRATORY:  Clear to auscultation without rales, wheezing or rhonchi  ABDOMEN: Soft, non-tender, non-distended MUSCULOSKELETAL:  No edema; No deformity  SKIN: Warm and dry NEUROLOGIC:  Alert and oriented x 3 PSYCHIATRIC:  Normal affect    Risk Assessment/Risk Calculators:        Modified World Health Organization (WHO) Classification of Maternal CV Risk   Class I         ASSESSMENT & PLAN:    Coronary Artery Disease in Pregnancy Status post PCI with drug eluted stents to RCA and OM in 2020. Currently [redacted] weeks pregnant with reported shortness of breath and fatigue. No chest pain. On aspirin 162mg  daily - will help with preeclampsia prophylaxis. Statin  held due to pregnancy. Discussed with the patient about the change is statin recommendation since she is a high risk patient - but she prefers not to take statin during this pregnancy -Order echocardiogram today to assess cardiac function in the setting of the shortness of breath and known hx of ischemic cardiomyopathy. -Continue aspirin 162mg  daily. -Advise patient to monitor blood pressure daily at home. -Plan to reinitiate statin therapy postpartum if not breastfeeding. -Follow up in 3-4 weeks or sooner if significant leg swelling develops.  Chronic hypertension in pregnancy - blood pressure stable, continue labetalol.  HLN- she prefers to hold statin. Counseled on ASCVD risk reduction with diet while not on statin she is high ris ASCVD patient    Patient Instructions  Medication Instructions:  Your physician recommends that you continue on your current medications as directed. Please refer to the Current Medication list given to you today.  *If you need a refill on your cardiac medications before your next appointment, please call your pharmacy*   Lab Work: None   Testing/Procedures: Your physician has requested that you have an echocardiogram OB. Echocardiography is a painless test that uses sound waves to create images of your heart. It provides your doctor with information about the size and shape of your heart and how well your heart's chambers and valves are working. This procedure takes approximately one hour. There are no restrictions for this procedure. Please do NOT wear cologne, perfume, aftershave, or lotions (deodorant is allowed). Please arrive 15 minutes prior to your appointment time.  Please note: We ask at that you not bring children with you during ultrasound (echo/ vascular)  testing. Due to room size and safety concerns, children are not allowed in the ultrasound rooms during exams. Our front office staff cannot provide observation of children in our lobby area while  testing is being conducted. An adult accompanying a patient to their appointment will only be allowed in the ultrasound room at the discretion of the ultrasound technician under special circumstances. We apologize for any inconvenience.    Follow-Up: At [facility], you and your health needs are our priority.  As part of our continuing mission to provide you with exceptional heart care, we have created designated Provider Care Teams.  These Care Teams include your primary Cardiologist (physician) and Advanced Practice Providers (APPs -  Physician Assistants and Nurse Practitioners) who all work together to provide you with the care you need, when you need it.   Your next appointment:   4 week(s)  Provider:   Thomasene Ripple, DO    Dispo:  No follow-ups on file.   Medication Adjustments/Labs and Tests Ordered: Current medicines are reviewed at length with the patient today.  Concerns regarding medicines are outlined above.  Tests Ordered: Orders Placed This Encounter  Procedures   EKG 12-Lead   ECHOCARDIOGRAM COMPLETE   Medication Changes: No orders of the defined types were placed in this encounter.

## 2023-09-11 NOTE — Patient Instructions (Addendum)
Medication Instructions:  Your physician recommends that you continue on your current medications as directed. Please refer to the Current Medication list given to you today.  *If you need a refill on your cardiac medications before your next appointment, please call your pharmacy*   Lab Work: None   Testing/Procedures: Your physician has requested that you have an echocardiogram OB. Echocardiography is a painless test that uses sound waves to create images of your heart. It provides your doctor with information about the size and shape of your heart and how well your heart's chambers and valves are working. This procedure takes approximately one hour. There are no restrictions for this procedure. Please do NOT wear cologne, perfume, aftershave, or lotions (deodorant is allowed). Please arrive 15 minutes prior to your appointment time.  Please note: We ask at that you not bring children with you during ultrasound (echo/ vascular) testing. Due to room size and safety concerns, children are not allowed in the ultrasound rooms during exams. Our front office staff cannot provide observation of children in our lobby area while testing is being conducted. An adult accompanying a patient to their appointment will only be allowed in the ultrasound room at the discretion of the ultrasound technician under special circumstances. We apologize for any inconvenience.    Follow-Up: At [facility], you and your health needs are our priority.  As part of our continuing mission to provide you with exceptional heart care, we have created designated Provider Care Teams.  These Care Teams include your primary Cardiologist (physician) and Advanced Practice Providers (APPs -  Physician Assistants and Nurse Practitioners) who all work together to provide you with the care you need, when you need it.   Your next appointment:   4 week(s)  Provider:   Thomasene Ripple, DO

## 2023-09-18 ENCOUNTER — Other Ambulatory Visit: Payer: Self-pay | Admitting: Women's Health

## 2023-09-18 DIAGNOSIS — I10 Essential (primary) hypertension: Secondary | ICD-10-CM

## 2023-09-18 DIAGNOSIS — O0991 Supervision of high risk pregnancy, unspecified, first trimester: Secondary | ICD-10-CM

## 2023-09-18 DIAGNOSIS — O09521 Supervision of elderly multigravida, first trimester: Secondary | ICD-10-CM

## 2023-09-18 DIAGNOSIS — I252 Old myocardial infarction: Secondary | ICD-10-CM

## 2023-09-18 DIAGNOSIS — O10919 Unspecified pre-existing hypertension complicating pregnancy, unspecified trimester: Secondary | ICD-10-CM

## 2023-09-18 DIAGNOSIS — Z3A14 14 weeks gestation of pregnancy: Secondary | ICD-10-CM

## 2023-09-18 DIAGNOSIS — Z363 Encounter for antenatal screening for malformations: Secondary | ICD-10-CM

## 2023-09-18 DIAGNOSIS — E282 Polycystic ovarian syndrome: Secondary | ICD-10-CM

## 2023-09-18 DIAGNOSIS — O09529 Supervision of elderly multigravida, unspecified trimester: Secondary | ICD-10-CM

## 2023-09-20 ENCOUNTER — Other Ambulatory Visit (HOSPITAL_COMMUNITY)
Admission: RE | Admit: 2023-09-20 | Discharge: 2023-09-20 | Disposition: A | Discharge status: Home / Self Care | Payer: Medicaid Other | Source: Ambulatory Visit | Attending: Obstetrics & Gynecology | Admitting: Obstetrics & Gynecology

## 2023-09-20 ENCOUNTER — Encounter: Payer: Self-pay | Admitting: Obstetrics & Gynecology

## 2023-09-20 ENCOUNTER — Ambulatory Visit (INDEPENDENT_AMBULATORY_CARE_PROVIDER_SITE_OTHER): Payer: Medicaid Other | Admitting: Radiology

## 2023-09-20 ENCOUNTER — Ambulatory Visit (INDEPENDENT_AMBULATORY_CARE_PROVIDER_SITE_OTHER): Payer: Medicaid Other | Admitting: Obstetrics & Gynecology

## 2023-09-20 VITALS — BP 135/84 | HR 78 | Wt 226.0 lb

## 2023-09-20 DIAGNOSIS — O0992 Supervision of high risk pregnancy, unspecified, second trimester: Secondary | ICD-10-CM | POA: Insufficient documentation

## 2023-09-20 DIAGNOSIS — Z1379 Encounter for other screening for genetic and chromosomal anomalies: Secondary | ICD-10-CM | POA: Diagnosis not present

## 2023-09-20 DIAGNOSIS — I10 Essential (primary) hypertension: Secondary | ICD-10-CM | POA: Diagnosis not present

## 2023-09-20 DIAGNOSIS — Z363 Encounter for antenatal screening for malformations: Secondary | ICD-10-CM | POA: Diagnosis not present

## 2023-09-20 DIAGNOSIS — Z3A18 18 weeks gestation of pregnancy: Secondary | ICD-10-CM

## 2023-09-20 DIAGNOSIS — O10912 Unspecified pre-existing hypertension complicating pregnancy, second trimester: Secondary | ICD-10-CM

## 2023-09-20 DIAGNOSIS — O09522 Supervision of elderly multigravida, second trimester: Secondary | ICD-10-CM

## 2023-09-20 DIAGNOSIS — O10919 Unspecified pre-existing hypertension complicating pregnancy, unspecified trimester: Secondary | ICD-10-CM

## 2023-09-20 DIAGNOSIS — Z124 Encounter for screening for malignant neoplasm of cervix: Secondary | ICD-10-CM

## 2023-09-20 DIAGNOSIS — O0991 Supervision of high risk pregnancy, unspecified, first trimester: Secondary | ICD-10-CM

## 2023-09-20 DIAGNOSIS — O09529 Supervision of elderly multigravida, unspecified trimester: Secondary | ICD-10-CM

## 2023-09-20 LAB — POCT URINALYSIS DIPSTICK OB
Blood, UA: NEGATIVE
Glucose, UA: NEGATIVE
Ketones, UA: NEGATIVE
Leukocytes, UA: NEGATIVE
Nitrite, UA: NEGATIVE
POC,PROTEIN,UA: NEGATIVE

## 2023-09-20 NOTE — Progress Notes (Signed)
GA = 18+1 by LMP  Single active female fetus,  cephalic    FHR = 141bpm Anterior placenta high    normal amn fluid vol   MVP = 4.2cm    EFW 18%   AC 25%  202g    Normal Anatomy screen - no apparent abn seen Ovaries appear enlarged with tiny follicles seen in peripheral aspect c/w PCOS Neg adnexal regions CL = 3.9 cm    neg CDS - no Free fluid

## 2023-09-20 NOTE — Progress Notes (Signed)
HIGH-RISK PREGNANCY VISIT Patient name: Kaitlin Torres MRN [id number]  Date of birth: 1981/10/20 Chief Complaint:   High Risk Gestation ([place], PAP & 2nd IT today! )  History of Present Illness:   Kaitlin Torres is a 42 y.o. G3P3003 female at [redacted]w[redacted]d with an Estimated Date of Delivery: 02/20/24 being seen today for ongoing management of a high-risk pregnancy complicated by:  -h/o MI 2020, seen by cardiology- ECHO completed, results pending -Chronic HTN on Labetalol  Today she reports no complaints. She does not SOB at work at times when she is busy running around.  Denies CP or SOB at rest  Contractions: Not present. Vag. Bleeding: None.  Movement: Present. denies leaking of fluid.      08/22/2023    2:45 PM 07/06/2023    3:48 PM 12/22/2022    3:55 PM 11/01/2022    9:36 AM 05/17/2022   11:49 AM  Depression screen PHQ 2/9  Decreased Interest 0 0 0 0 0  Down, Depressed, Hopeless 0 0 0 0 0  PHQ - 2 Score 0 0 0 0 0  Altered sleeping 3 0 0 0 0  Tired, decreased energy 3 3 0 0 0  Change in appetite 0 0 0 0 0  Feeling bad or failure about yourself  0 0 0 0 0  Trouble concentrating 0 0 0 0 0  Moving slowly or fidgety/restless 0 0 0 0 0  Suicidal thoughts 0 0 0 0 0  PHQ-9 Score 6 3 0 0 0  Difficult doing work/chores   Not difficult at all Not difficult at all Not difficult at all     Current Outpatient Medications  Medication Instructions   aspirin EC 162 mg, Oral, Daily, Swallow whole.   atorvastatin (LIPITOR) 80 MG tablet 1 tablet, Daily   Blood Pressure Monitor MISC For regular home bp monitoring during pregnancy   labetalol (NORMODYNE) 100 mg, Oral, 2 times daily   Prenatal Vit-Fe Sulfate-FA-DHA (PRENATAL VITAMIN/MIN +DHA) 27-0.8-200 MG CAPS 1 tablet, Oral, Daily     Review of Systems:   Pertinent items are noted in HPI Denies abnormal vaginal discharge w/ itching/odor/irritation, headaches, visual changes, chest pain, abdominal pain, severe nausea/vomiting, or problems with  urination or bowel movements unless otherwise stated above. Pertinent History Reviewed:  Reviewed past medical,surgical, social, obstetrical and family history.  Reviewed problem list, medications and allergies. Physical Assessment:   Vitals:   09/20/23 1144 09/20/23 1157  BP: (!) 147/96 135/84  Pulse: 80 78  Weight: 226 lb (102.5 kg)   Body mass index is 33.37 kg/m.           Physical Examination:   General appearance: alert, well appearing, and in no distress  Mental status: normal mood, behavior, speech, dress, motor activity, and thought processes  Skin: warm & dry   Extremities: Edema: None    Cardiovascular: normal heart rate noted  Respiratory: normal respiratory effort, no distress  Abdomen: gravid, soft, non-tender  Pelvic:  SSE: Normal external genitalia, normal vaginal mucosa.  Cervix visualized no lesions or masses seen Pap and HPV obtained          Fetal Status:     Movement: Present    Fetal Surveillance Testing today: anatomy scan- Single active female fetus,  cephalic    FHR = 141bpm Anterior placenta high    normal amn fluid vol   MVP = 4.2cm    EFW 18%   AC 25%  202g    Normal Anatomy  screen - no apparent abn seen Ovaries appear enlarged with tiny follicles seen in peripheral aspect c/w PCOS  Chaperone: Malachy Mood    Results for orders placed or performed in visit on 09/20/23 (from the past 24 hour(s))  POC Urinalysis Dipstick OB   Collection Time: 09/20/23 11:56 AM  Result Value Ref Range   Color, UA     Clarity, UA     Glucose, UA Negative Negative   Bilirubin, UA     Ketones, UA neg    Spec Grav, UA     Blood, UA neg    pH, UA     POC,PROTEIN,UA Negative Negative, Trace, Small (1+), Moderate (2+), Large (3+), 4+   Urobilinogen, UA     Nitrite, UA neg    Leukocytes, UA Negative Negative   Appearance     Odor       Assessment & Plan:  High-risk pregnancy: [id number] at [redacted]w[redacted]d with an Estimated Date of Delivery: 02/20/24   1) Chronic HTN -continue  with current meds -growth every 4 wks -antepartum testing @ 32wks  2) h/o MI -ECHO completed, follow by cardiology  Meds: No orders of the defined types were placed in this encounter.   Labs/procedures today: anatomy scan  Treatment Plan:  routine OB care and as outlined above  Reviewed: Preterm labor symptoms and general obstetric precautions including but not limited to vaginal bleeding, contractions, leaking of fluid and fetal movement were reviewed in detail with the patient.  All questions were answered. Pt has home bp cuff. Check bp weekly, let us know if >140/90.   Follow-up: Return in about 4 weeks (around 10/18/2023) for HROB visit and growth every 4 wks and BPP weekly starting 2/19.   Future Appointments  Date Time Provider Department Center  10/10/2023  3:50 PM [facility] ECHO 2 [id number]  10/13/2023 11:00 AM Tobb, Kardie, DO [facility] None  10/17/2023 11:30 AM Myna Hidalgo, DO [facility]  10/17/2023  1:30 PM [facility] IMG 2 [facility] None    Orders Placed This Encounter  Procedures   INTEGRATED 2   POC Urinalysis Dipstick OB    Myna Hidalgo, DO Attending Obstetrician & Gynecologist, [facility] [employer], [facility]

## 2023-09-22 LAB — INTEGRATED 2
AFP MoM: 1.05
Alpha-Fetoprotein: 28.3 ng/mL
Crown Rump Length: 74.1 mm
DIA MoM: 1.01
DIA Value: 128.4 pg/mL
Estriol, Unconjugated: 1.17 ng/mL
Gest. Age on Collection Date: 13.4 wk
Gestational Age: 17.4 wk
Maternal Age at EDD: 41.6 a
Nuchal Translucency (NT): 2 mm
Nuchal Translucency MoM: 1.3
Number of Fetuses: 1
PAPP-A MoM: 1.52
PAPP-A Value: 1295.3 ng/mL
Sonographer ID#: 309760
Test Results:: NEGATIVE
Weight: 218 [lb_av]
Weight: 226 [lb_av]
hCG MoM: 1.34
hCG Value: 27.5 [IU]/mL
uE3 MoM: 1.15

## 2023-09-22 LAB — CYTOLOGY - PAP
Comment: NEGATIVE
Diagnosis: NEGATIVE
High risk HPV: NEGATIVE

## 2023-10-02 ENCOUNTER — Encounter: Payer: Self-pay | Admitting: Women's Health

## 2023-10-10 ENCOUNTER — Ambulatory Visit (HOSPITAL_COMMUNITY): Payer: Medicaid Other | Attending: Cardiology

## 2023-10-10 DIAGNOSIS — R0609 Other forms of dyspnea: Secondary | ICD-10-CM

## 2023-10-10 LAB — ECHOCARDIOGRAM COMPLETE
Area-P 1/2: 3.59 cm2
S' Lateral: 2.9 cm

## 2023-10-13 ENCOUNTER — Encounter: Payer: Self-pay | Admitting: Cardiology

## 2023-10-13 ENCOUNTER — Ambulatory Visit: Payer: Medicaid Other | Attending: Cardiology | Admitting: Cardiology

## 2023-10-13 VITALS — BP 136/90 | HR 79 | Ht 69.0 in | Wt 223.8 lb

## 2023-10-13 DIAGNOSIS — Z3A21 21 weeks gestation of pregnancy: Secondary | ICD-10-CM | POA: Diagnosis not present

## 2023-10-13 DIAGNOSIS — Z720 Tobacco use: Secondary | ICD-10-CM

## 2023-10-13 DIAGNOSIS — E782 Mixed hyperlipidemia: Secondary | ICD-10-CM | POA: Diagnosis not present

## 2023-10-13 DIAGNOSIS — I10 Essential (primary) hypertension: Secondary | ICD-10-CM | POA: Diagnosis not present

## 2023-10-13 DIAGNOSIS — I2510 Atherosclerotic heart disease of native coronary artery without angina pectoris: Secondary | ICD-10-CM

## 2023-10-13 NOTE — Progress Notes (Signed)
Cardio-Obstetrics Clinic  New Evaluation  Date:  10/13/2023   ID:  YVONE Torres, DOB 05-Apr-1981, MRN [id number]  PCP:  Sonny Masters, FNP   [facility] Providers Cardiologist:  Thomasene Ripple, DO  Electrophysiologist:  None       Referring MD: Sonny Masters, FNP   Chief Complaint: " I am ok"  History of Present Illness:    Kaitlin Torres is a 42 y.o. female [G4P3003] who is being seen today in follow up.    Medical hx include CAD s/p PCI to the RCA and OM vessels in 2020, Familial hypercholesteremia, obesity, Tobacco use/vapes, obesity, hypertension.   She is currently 21 weeks 3 days pregnant.  She admits to not taking her blood pressure medication regularly, which is a concern. She reports no other symptoms or issues related to her pregnancy.   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  Fibrocystic change.  Usual duct  epithelial hyperplasia.   CORONARY STENT INTERVENTION N/A 05/08/2019   Procedure: CORONARY STENT INTERVENTION;  Surgeon: Swaziland, Peter M, MD;  Location: [facility] INVASIVE CV LAB;  Service: Cardiovascular;  Laterality: N/A;   FRACTURE SURGERY  2005   right ankle   HEMORRHOID SURGERY     HEMORRHOID SURGERY  2015   INCISION AND DRAINAGE BREAST ABSCESS  09/2010   left breast    LEFT HEART CATH AND CORONARY ANGIOGRAPHY N/A 05/08/2019   Procedure: LEFT HEART CATH AND CORONARY ANGIOGRAPHY;  Surgeon: Swaziland, Peter M, MD;  Location: [facility] INVASIVE CV LAB;   Service: Cardiovascular;  Laterality: N/A;      OB History     Gravida  4   Para  3   Term  3   Preterm      AB      Living  3      SAB      IAB      Ectopic      Multiple      Live Births  3               Current Medications: Current Meds  Medication Sig   aspirin EC 81 MG tablet Take 2 tablets (162 mg total) by mouth daily. Swallow whole.   Blood Pressure Monitor MISC For regular home bp monitoring during pregnancy   labetalol (NORMODYNE) 100 MG tablet Take 1 tablet (100 mg total) by mouth 2 (two) times daily.   Prenatal Vit-Fe Sulfate-FA-DHA (PRENATAL VITAMIN/MIN +DHA) 27-0.8-200 MG CAPS Take 1 tablet by mouth daily.     Allergies:   Diclofenac   Social History   Socioeconomic History   Marital status: Single    Spouse name: Not on file   Number of children: 3   Years of education: 14   Highest education level: Associate degree: academic program  Occupational History   Occupation: Waitress  Tobacco Use   Smoking status: Former  Current packs/day: 0.00    Average packs/day: 0.1 packs/day for 26.0 years (2.6 ttl pk-yrs)    Types: Cigarettes    Start date: 09/15/1993    Quit date: 09/16/2019    Years since quitting: 4.0   Smokeless tobacco: Never  Vaping Use   Vaping status: Every Day   Substances: Nicotine  Substance and Sexual Activity   Alcohol use: No    Comment: quit 2018   Drug use: No   Sexual activity: Yes    Birth control/protection: None  Other Topics Concern   Not on file  Social History Narrative   Lives at home with her mother, husband and three daughters.   Right-handed.   Approximately 20 cups caffeine per day (4-5 glasses holding 32oz of tea).   Social Determinants of Health   Financial Resource Strain: Medium Risk (07/06/2023)   Overall Financial Resource Strain (CARDIA)    Difficulty of Paying Living Expenses: Somewhat hard  Food Insecurity: Food Insecurity Present (07/06/2023)   Hunger Vital Sign    Worried About  Running Out of Food in the Last Year: Often true    Ran Out of Food in the Last Year: Often true  Transportation Needs: No Transportation Needs (07/06/2023)   PRAPARE - Administrator, Civil Service (Medical): No    Lack of Transportation (Non-Medical): No  Physical Activity: Sufficiently Active (07/06/2023)   Exercise Vital Sign    Days of Exercise per Week: 5 days    Minutes of Exercise per Session: 90 min  Stress: No Stress Concern Present (07/06/2023)   [employer] of Occupational Health - Occupational Stress Questionnaire    Feeling of Stress : Only a little  Social Connections: Moderately Isolated (07/06/2023)   Social Connection and Isolation Panel [NHANES]    Frequency of Communication with Friends and Family: More than three times a week    Frequency of Social Gatherings with Friends and Family: More than three times a week    Attends Religious Services: Never    Database administrator or Organizations: No    Attends Engineer, structural: Never    Marital Status: Living with partner      Family History  Problem Relation Age of Onset   Hypertension Mother    Peripheral Artery Disease Mother    Heart disease Father        Cardiac arrest 2006   Diabetes Father    Hyperlipidemia Father    Stroke Father    Aneurysm Brother    Cervical cancer Maternal Grandmother    Diabetes Paternal Grandmother       ROS:   Please see the history of present illness.     All other systems reviewed and are negative.   Labs/EKG Reviewed:    EKG:   EKG was ordered today.  The ekg ordered today demonstrates sinus rhythm, heart rate 96 bpm  Recent Labs: 11/01/2022: TSH 2.400 02/05/2023: B Natriuretic Peptide 16.0 08/23/2023: ALT 14; BUN 5; Creatinine, Ser 0.47; Hemoglobin 12.1; Platelets 278; Potassium 3.9; Sodium 136   Recent Lipid Panel Lab Results  Component Value Date/Time   CHOL 202 (H) 11/01/2022 09:49 AM   TRIG 103 11/01/2022 09:49 AM   HDL 36 (L)  11/01/2022 09:49 AM   CHOLHDL 5.6 (H) 11/01/2022 09:49 AM   CHOLHDL 10.0 05/09/2019 03:57 AM   LDLCALC 147 (H) 11/01/2022 09:49 AM    Physical Exam:    VS:  BP (!) 136/90 (BP Location: Right Arm, Patient  Position: Sitting, Cuff Size: Normal)   Pulse 79   Ht 5\' 9"  (1.753 m)   Wt 223 lb 12.8 oz (101.5 kg)   LMP 05/16/2023   SpO2 99%   BMI 33.05 kg/m     Wt Readings from Last 3 Encounters:  10/13/23 223 lb 12.8 oz (101.5 kg)  09/20/23 226 lb (102.5 kg)  09/11/23 222 lb 9.6 oz (101 kg)     GEN:  Well nourished, well developed in no acute distress HEENT: Normal NECK: No JVD; No carotid bruits LYMPHATICS: No lymphadenopathy CARDIAC: RRR, no murmurs, rubs, gallops RESPIRATORY:  Clear to auscultation without rales, wheezing or rhonchi  ABDOMEN: Soft, non-tender, non-distended MUSCULOSKELETAL:  No edema; No deformity  SKIN: Warm and dry NEUROLOGIC:  Alert and oriented x 3 PSYCHIATRIC:  Normal affect    Risk Assessment/Risk Calculators:                 ASSESSMENT & PLAN:    Coronary Artery Disease in Pregnancy Status post PCI with drug eluted stents to RCA and OM in 2020. Recent echo was normal.  Continue aspirin 81 mg daily. -Advise patient to monitor blood pressure daily at home. -Plan to reinitiate statin therapy postpartum if not breastfeeding. -Follow up in 3-4 weeks or sooner if significant leg swelling develops.  Chronic hypertension in pregnancy - blood pressure elevated - I am concerned that she is not taking her medication. I expressed to the patient that she is putting herself at risk - worried about preeclampsia.  HLN- she prefers to hold statin. Counseled on ASCVD risk reduction with diet while not on statin she is high ris ASCVD patient   Tobacco use - cessation advised.    Patient Instructions  Medication Instructions:  Your physician recommends that you continue on your current medications as directed. Please refer to the Current Medication list  given to you today.  *If you need a refill on your cardiac medications before your next appointment, please call your pharmacy*  Follow-Up: At [facility], you and your health needs are our priority.  As part of our continuing mission to provide you with exceptional heart care, we have created designated Provider Care Teams.  These Care Teams include your primary Cardiologist (physician) and Advanced Practice Providers (APPs -  Physician Assistants and Nurse Practitioners) who all work together to provide you with the care you need, when you need it.   Your next appointment:   2 week(s)  Provider:   Thomasene Ripple, DO  or Iran Planas  Other Instructions Please take your blood pressure daily for 1 week and send in a MyChart message. Please include heart rates. (One message at the end of the 1 week).   HOW TO TAKE YOUR BLOOD PRESSURE: Rest 5 minutes before taking your blood pressure. Don't smoke or drink caffeinated beverages for at least 30 minutes before. Take your blood pressure before (not after) you eat. Sit comfortably with your back supported and both feet on the floor (don't cross your legs). Elevate your arm to heart level on a table or a desk. Use the proper sized cuff. It should fit smoothly and snugly around your bare upper arm. There should be enough room to slip a fingertip under the cuff. The bottom edge of the cuff should be 1 inch above the crease of the elbow. Ideally, take 3 measurements at one sitting and record the average.     Dispo:  No follow-ups on file.   Medication Adjustments/Labs and  Tests Ordered: Current medicines are reviewed at length with the patient today.  Concerns regarding medicines are outlined above.  Tests Ordered: Orders Placed This Encounter  Procedures   AMB Referral to [facility] Pharm[facility]   Medication Changes: No orders of the defined types were placed in this encounter.

## 2023-10-13 NOTE — Patient Instructions (Signed)
Medication Instructions:  Your physician recommends that you continue on your current medications as directed. Please refer to the Current Medication list given to you today.  *If you need a refill on your cardiac medications before your next appointment, please call your pharmacy*  Follow-Up: At [facility], you and your health needs are our priority.  As part of our continuing mission to provide you with exceptional heart care, we have created designated Provider Care Teams.  These Care Teams include your primary Cardiologist (physician) and Advanced Practice Providers (APPs -  Physician Assistants and Nurse Practitioners) who all work together to provide you with the care you need, when you need it.   Your next appointment:   2 week(s)  Provider:   Thomasene Ripple, DO  or [place] Planas  Other Instructions Please take your blood pressure daily for 1 week and send in a MyChart message. Please include heart rates. (One message at the end of the 1 week).   HOW TO TAKE YOUR BLOOD PRESSURE: Rest 5 minutes before taking your blood pressure. Don't smoke or drink caffeinated beverages for at least 30 minutes before. Take your blood pressure before (not after) you eat. Sit comfortably with your back supported and both feet on the floor (don't cross your legs). Elevate your arm to heart level on a table or a desk. Use the proper sized cuff. It should fit smoothly and snugly around your bare upper arm. There should be enough room to slip a fingertip under the cuff. The bottom edge of the cuff should be 1 inch above the crease of the elbow. Ideally, take 3 measurements at one sitting and record the average.

## 2023-10-17 ENCOUNTER — Ambulatory Visit (INDEPENDENT_AMBULATORY_CARE_PROVIDER_SITE_OTHER): Payer: Medicaid Other | Admitting: Obstetrics & Gynecology

## 2023-10-17 ENCOUNTER — Encounter: Payer: Medicaid Other | Admitting: Obstetrics & Gynecology

## 2023-10-17 ENCOUNTER — Ambulatory Visit: Payer: Medicaid Other | Admitting: Radiology

## 2023-10-17 ENCOUNTER — Encounter: Payer: Self-pay | Admitting: Obstetrics & Gynecology

## 2023-10-17 VITALS — BP 110/70 | HR 71 | Wt 223.5 lb

## 2023-10-17 DIAGNOSIS — O0992 Supervision of high risk pregnancy, unspecified, second trimester: Secondary | ICD-10-CM

## 2023-10-17 DIAGNOSIS — O10912 Unspecified pre-existing hypertension complicating pregnancy, second trimester: Secondary | ICD-10-CM

## 2023-10-17 DIAGNOSIS — O09522 Supervision of elderly multigravida, second trimester: Secondary | ICD-10-CM

## 2023-10-17 DIAGNOSIS — Z3A22 22 weeks gestation of pregnancy: Secondary | ICD-10-CM | POA: Diagnosis not present

## 2023-10-17 DIAGNOSIS — O10919 Unspecified pre-existing hypertension complicating pregnancy, unspecified trimester: Secondary | ICD-10-CM

## 2023-10-17 DIAGNOSIS — I252 Old myocardial infarction: Secondary | ICD-10-CM

## 2023-10-17 NOTE — Progress Notes (Signed)
HIGH-RISK PREGNANCY VISIT Patient name: Kaitlin Torres MRN [id number]  Date of birth: 08/22/1981 Chief Complaint:   High Risk Gestation ([place] today! )  History of Present Illness:   Kaitlin Torres is a 42 y.o. G48P3003 female at [redacted]w[redacted]d with an Estimated Date of Delivery: 02/20/24 being seen today for ongoing management of a high-risk pregnancy complicated by:  1) Chronic HTN On Labetalol 100mg  bid 2) h/o MI- followed by cards 3) anxiety- no meds 4) AMA  Today she reports no complaints.   Contractions: Not present. Vag. Bleeding: None.  Movement: Present. denies leaking of fluid.      08/22/2023    2:45 PM 07/06/2023    3:48 PM 12/22/2022    3:55 PM 11/01/2022    9:36 AM 05/17/2022   11:49 AM  Depression screen PHQ 2/9  Decreased Interest 0 0 0 0 0  Down, Depressed, Hopeless 0 0 0 0 0  PHQ - 2 Score 0 0 0 0 0  Altered sleeping 3 0 0 0 0  Tired, decreased energy 3 3 0 0 0  Change in appetite 0 0 0 0 0  Feeling bad or failure about yourself  0 0 0 0 0  Trouble concentrating 0 0 0 0 0  Moving slowly or fidgety/restless 0 0 0 0 0  Suicidal thoughts 0 0 0 0 0  PHQ-9 Score 6 3 0 0 0  Difficult doing work/chores   Not difficult at all Not difficult at all Not difficult at all     Current Outpatient Medications  Medication Instructions   aspirin EC 162 mg, Oral, Daily, Swallow whole.   Blood Pressure Monitor MISC For regular home bp monitoring during pregnancy   labetalol (NORMODYNE) 100 mg, Oral, 2 times daily   Prenatal Vit-Fe Sulfate-FA-DHA (PRENATAL VITAMIN/MIN +DHA) 27-0.8-200 MG CAPS 1 tablet, Oral, Daily     Review of Systems:   Pertinent items are noted in HPI Denies abnormal vaginal discharge w/ itching/odor/irritation, headaches, visual changes, shortness of breath, chest pain, abdominal pain, severe nausea/vomiting, or problems with urination or bowel movements unless otherwise stated above. Pertinent History Reviewed:  Reviewed past medical,surgical, social,  obstetrical and family history.  Reviewed problem list, medications and allergies. Physical Assessment:   Vitals:   10/17/23 1419  BP: 110/70  Pulse: 71  Weight: 223 lb 8 oz (101.4 kg)  Body mass index is 33.01 kg/m.           Physical Examination:   General appearance: alert, well appearing, and in no distress  Mental status: normal mood, behavior, speech, dress, motor activity, and thought processes  Skin: warm & dry   Extremities: Edema: None    Cardiovascular: normal heart rate noted  Respiratory: normal respiratory effort, no distress  Abdomen: gravid, soft, non-tender  Pelvic: Cervical exam deferred         Fetal Status:     Movement: Present    Fetal Surveillance Testing today: growth scan-   Single active female fetus,  cephalic  FHR = 149 bpm  anterior pl gr1 SVP = 6 cm normal amn fluid volume  EFW 36% 456g Intramural fibroid mid right ut wall = 23 x 19 mm  nl L ov - Rt ov not seen CL = 5.3 cm,  closed  Chaperone: N/A    No results found for this or any previous visit (from the past 24 hour(s)).   Assessment & Plan:  High-risk pregnancy: G4P3003 at [redacted]w[redacted]d with an Estimated Date of  Delivery: 02/20/24   1) Chronic HTN On Labetalol 100mg  bid Normal growth today, continue q 4wks Antepartum testing @ 32wks  2) h/o MI- followed by cards 3) anxiety- no meds 4) AMA  Meds: No orders of the defined types were placed in this encounter.   Labs/procedures today: growth  Treatment Plan:  as outlined above and routine OB care  Reviewed: Preterm labor symptoms and general obstetric precautions including but not limited to vaginal bleeding, contractions, leaking of fluid and fetal movement were reviewed in detail with the patient.  All questions were answered. Pt has home bp cuff. Check bp weekly, let us know if >160/100.   Follow-up: Return in about 4 weeks (around 11/14/2023) for HROB visit, growth and PN2.   Future Appointments  Date Time Provider Department Center   10/26/2023  4:00 PM Tobb, Kardie, DO [facility] None  11/14/2023  8:50 AM [facility]  11/14/2023  9:15 AM [facility] 2 [facility] None  11/14/2023 10:10 AM Myna Hidalgo, DO [facility]  12/12/2023  1:30 PM [facility] 2 [facility] None  12/26/2023  1:30 PM [facility] 2 [facility] None  12/29/2023 11:10 AM [facility] NURSE [facility]  01/02/2024  1:30 PM [facility] 2 [facility] None  01/05/2024 10:50 AM [facility] NURSE [facility]  01/09/2024  1:30 PM [facility] 2 [facility] None  01/12/2024 10:50 AM [facility] NURSE [facility]  01/16/2024  1:30 PM [facility]G 2 [facility] None  01/19/2024 10:50 AM [facility] NURSE [facility]  01/23/2024  1:30 PM [facility] IMG 2 [facility] None  01/26/2024 10:50 AM [facility] NURSE [facility]  01/30/2024  1:30 PM [facility] IMG 2 [facility] None  02/02/2024 10:50 AM [facility] NURSE [facility]  02/06/2024  1:30 PM [facility] IMG 2 [facility] None  02/09/2024 10:50 AM [facility] NURSE [facility]  02/13/2024  1:30 PM [facility] IMG 2 [facility] None  02/16/2024 10:50 AM [facility] NURSE [facility]    No orders of the defined types were placed in this encounter.   Myna Hidalgo, DO Attending Obstetrician & Gynecologist, [facility] for [employer], [facility]

## 2023-10-17 NOTE — Progress Notes (Signed)
GA [redacted] weeks Single active female fetus,  cephalic  FHR = 149 bpm  anterior pl gr1 SVP = 6 cm normal amn fluid volume  EFW 36% 456g Intramural fibroid mid right ut wall = 23 x 19 mm  nl L ov - Rt ov not seen CL = 5.3 cm,  closed

## 2023-10-26 ENCOUNTER — Ambulatory Visit: Payer: Medicaid Other | Attending: Cardiology | Admitting: Cardiology

## 2023-10-26 ENCOUNTER — Encounter: Payer: Self-pay | Admitting: Cardiology

## 2023-10-26 VITALS — BP 116/72 | HR 78 | Ht 69.0 in | Wt 226.0 lb

## 2023-10-26 DIAGNOSIS — Z3A23 23 weeks gestation of pregnancy: Secondary | ICD-10-CM | POA: Diagnosis not present

## 2023-10-26 DIAGNOSIS — E782 Mixed hyperlipidemia: Secondary | ICD-10-CM

## 2023-10-26 DIAGNOSIS — Z720 Tobacco use: Secondary | ICD-10-CM

## 2023-10-26 DIAGNOSIS — O10919 Unspecified pre-existing hypertension complicating pregnancy, unspecified trimester: Secondary | ICD-10-CM | POA: Diagnosis not present

## 2023-10-26 DIAGNOSIS — I2510 Atherosclerotic heart disease of native coronary artery without angina pectoris: Secondary | ICD-10-CM

## 2023-10-26 NOTE — Patient Instructions (Signed)
Medication Instructions:  Your physician recommends that you continue on your current medications as directed. Please refer to the Current Medication list given to you today.  *If you need a refill on your cardiac medications before your next appointment, please call your pharmacy*    Follow-Up: At [facility], you and your health needs are our priority.  As part of our continuing mission to provide you with exceptional heart care, we have created designated Provider Care Teams.  These Care Teams include your primary Cardiologist (physician) and Advanced Practice Providers (APPs -  Physician Assistants and Nurse Practitioners) who all work together to provide you with the care you need, when you need it.  We recommend signing up for the patient portal called "MyChart".  Sign up information is provided on this After Visit Summary.  MyChart is used to connect with patients for Virtual Visits (Telemedicine).  Patients are able to view lab/test results, encounter notes, upcoming appointments, etc.  Non-urgent messages can be sent to your provider as well.   To learn more about what you can do with MyChart, go to [email or web address].    Your next appointment:   8 week(s)  Provider:   Thomasene Ripple, DO

## 2023-10-28 NOTE — Progress Notes (Signed)
Cardio-Obstetrics Clinic  New Evaluation  Date:  10/28/2023   ID:  Kaitlin Torres, DOB 03-15-1981, MRN [id number]  PCP:  Sonny Masters, FNP   [facility] Providers Cardiologist:  Thomasene Ripple, DO  Electrophysiologist:  None       Referring MD: Sonny Masters, FNP   Chief Complaint: " I am ok"  History of Present Illness:    Kaitlin Torres is a 42 y.o. female [G4P3003] who is being seen today in follow up.    Medical hx include CAD s/p PCI to the RCA and OM vessels in 2020, Familial hypercholesteremia, obesity, Tobacco use/vapes, obesity, hypertension.   She is currently 23  weeks 2 days pregnant.  She did not take her medications at her last visit. Since that visit she has been taking her pills she offers no complaints.   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  Fibrocystic change.  Usual duct  epithelial hyperplasia.   CORONARY STENT INTERVENTION N/A 05/08/2019   Procedure: CORONARY STENT INTERVENTION;  Surgeon: Swaziland, Peter M, MD;  Location: [facility] INVASIVE CV LAB;  Service: Cardiovascular;  Laterality: N/A;   FRACTURE SURGERY  2005   right ankle   HEMORRHOID SURGERY     HEMORRHOID SURGERY  2015   INCISION AND DRAINAGE BREAST ABSCESS  09/2010   left breast    LEFT HEART CATH AND CORONARY ANGIOGRAPHY N/A 05/08/2019   Procedure: LEFT HEART CATH AND CORONARY ANGIOGRAPHY;  Surgeon: Swaziland, Peter M, MD;  Location: [facility] INVASIVE CV LAB;  Service: Cardiovascular;   Laterality: N/A;      OB History     Gravida  4   Para  3   Term  3   Preterm      AB      Living  3      SAB      IAB      Ectopic      Multiple      Live Births  3               Current Medications: Current Meds  Medication Sig   aspirin EC 81 MG tablet Take 2 tablets (162 mg total) by mouth daily. Swallow whole.   Blood Pressure Monitor MISC For regular home bp monitoring during pregnancy   labetalol (NORMODYNE) 100 MG tablet Take 1 tablet (100 mg total) by mouth 2 (two) times daily.   Prenatal Vit-Fe Sulfate-FA-DHA (PRENATAL VITAMIN/MIN +DHA) 27-0.8-200 MG CAPS Take 1 tablet by mouth daily.     Allergies:   Diclofenac   Social History   Socioeconomic History   Marital status: Single    Spouse name: Not on file   Number of children: 3   Years of education: 14   Highest education level: Associate degree: academic program  Occupational History   Occupation: Waitress  Tobacco Use   Smoking status: Former    Current  packs/day: 0.00    Average packs/day: 0.1 packs/day for 26.0 years (2.6 ttl pk-yrs)    Types: Cigarettes    Start date: 09/15/1993    Quit date: 09/16/2019    Years since quitting: 4.1   Smokeless tobacco: Never  Vaping Use   Vaping status: Every Day   Substances: Nicotine  Substance and Sexual Activity   Alcohol use: No    Comment: quit 2018   Drug use: No   Sexual activity: Yes    Birth control/protection: None  Other Topics Concern   Not on file  Social History Narrative   Lives at home with her mother, husband and three daughters.   Right-handed.   Approximately 20 cups caffeine per day (4-5 glasses holding 32oz of tea).   Social Drivers of Health   Financial Resource Strain: Medium Risk (07/06/2023)   Overall Financial Resource Strain (CARDIA)    Difficulty of Paying Living Expenses: Somewhat hard  Food Insecurity: Food Insecurity Present (07/06/2023)   Hunger Vital Sign    Worried About Running Out of Food in the  Last Year: Often true    Ran Out of Food in the Last Year: Often true  Transportation Needs: No Transportation Needs (07/06/2023)   PRAPARE - Administrator, Civil Service (Medical): No    Lack of Transportation (Non-Medical): No  Physical Activity: Sufficiently Active (07/06/2023)   Exercise Vital Sign    Days of Exercise per Week: 5 days    Minutes of Exercise per Session: 90 min  Stress: No Stress Concern Present (07/06/2023)   [employer] of Occupational Health - Occupational Stress Questionnaire    Feeling of Stress : Only a little  Social Connections: Moderately Isolated (07/06/2023)   Social Connection and Isolation Panel [NHANES]    Frequency of Communication with Friends and Family: More than three times a week    Frequency of Social Gatherings with Friends and Family: More than three times a week    Attends Religious Services: Never    Database administrator or Organizations: No    Attends Engineer, structural: Never    Marital Status: Living with partner      Family History  Problem Relation Age of Onset   Hypertension Mother    Peripheral Artery Disease Mother    Heart disease Father        Cardiac arrest 2006   Diabetes Father    Hyperlipidemia Father    Stroke Father    Aneurysm Brother    Cervical cancer Maternal Grandmother    Diabetes Paternal Grandmother       ROS:   Please see the history of present illness.     All other systems reviewed and are negative.   Labs/EKG Reviewed:    EKG:   EKG was ordered today.  The ekg ordered today demonstrates sinus rhythm, heart rate 96 bpm  Recent Labs: 11/01/2022: TSH 2.400 02/05/2023: B Natriuretic Peptide 16.0 08/23/2023: ALT 14; BUN 5; Creatinine, Ser 0.47; Hemoglobin 12.1; Platelets 278; Potassium 3.9; Sodium 136   Recent Lipid Panel Lab Results  Component Value Date/Time   CHOL 202 (H) 11/01/2022 09:49 AM   TRIG 103 11/01/2022 09:49 AM   HDL 36 (L) 11/01/2022 09:49 AM    CHOLHDL 5.6 (H) 11/01/2022 09:49 AM   CHOLHDL 10.0 05/09/2019 03:57 AM   LDLCALC 147 (H) 11/01/2022 09:49 AM    Physical Exam:    VS:  BP 116/72 (BP Location: Right Arm, Patient Position: Sitting,  Cuff Size: Normal)   Pulse 78   Ht 5\' 9"  (1.753 m)   Wt 226 lb (102.5 kg)   LMP 05/16/2023   SpO2 99%   BMI 33.37 kg/m     Wt Readings from Last 3 Encounters:  10/26/23 226 lb (102.5 kg)  10/17/23 223 lb 8 oz (101.4 kg)  10/13/23 223 lb 12.8 oz (101.5 kg)     GEN:  Well nourished, well developed in no acute distress HEENT: Normal NECK: No JVD; No carotid bruits LYMPHATICS: No lymphadenopathy CARDIAC: RRR, no murmurs, rubs, gallops RESPIRATORY:  Clear to auscultation without rales, wheezing or rhonchi  ABDOMEN: Soft, non-tender, non-distended MUSCULOSKELETAL:  No edema; No deformity  SKIN: Warm and dry NEUROLOGIC:  Alert and oriented x 3 PSYCHIATRIC:  Normal affect    Risk Assessment/Risk Calculators:                 ASSESSMENT & PLAN:    Coronary Artery Disease in Pregnancy Status post PCI with drug eluted stents to RCA and OM in 2020. Recent echo was normal.  Continue aspirin 81 mg daily. -Advise patient to monitor blood pressure daily at home. -Plan to reinitiate statin therapy postpartum if not breastfeeding. -Follow up in 3-4 weeks or sooner if significant leg swelling develops.  Chronic hypertension in pregnancy - blood pressure is at target today.  HLN- she prefers to hold statin. Counseled on ASCVD risk reduction with diet while not on statin she is high ris ASCVD patient   Tobacco use - cessation advised.    Patient Instructions  Medication Instructions:  Your physician recommends that you continue on your current medications as directed. Please refer to the Current Medication list given to you today.  *If you need a refill on your cardiac medications before your next appointment, please call your pharmacy*    Follow-Up: At [facility],  you and your health needs are our priority.  As part of our continuing mission to provide you with exceptional heart care, we have created designated Provider Care Teams.  These Care Teams include your primary Cardiologist (physician) and Advanced Practice Providers (APPs -  Physician Assistants and Nurse Practitioners) who all work together to provide you with the care you need, when you need it.  We recommend signing up for the patient portal called "MyChart".  Sign up information is provided on this After Visit Summary.  MyChart is used to connect with patients for Virtual Visits (Telemedicine).  Patients are able to view lab/test results, encounter notes, upcoming appointments, etc.  Non-urgent messages can be sent to your provider as well.   To learn more about what you can do with MyChart, go to [email or web address].    Your next appointment:   8 week(s)  Provider:   Thomasene Ripple, DO      Dispo:  Return in about 8 weeks (around 12/21/2023).   Medication Adjustments/Labs and Tests Ordered: Current medicines are reviewed at length with the patient today.  Concerns regarding medicines are outlined above.  Tests Ordered: No orders of the defined types were placed in this encounter.  Medication Changes: No orders of the defined types were placed in this encounter.

## 2023-11-08 NOTE — L&D Delivery Note (Cosign Needed Addendum)
 Delivery Note Kaitlin Torres is a 43 y.o. [id number] at [redacted]w[redacted]d admitted for IOL for GDM.   GBS Status: Negative/-- (03/19 1433) Maximum Maternal Temperature: 97.34F  Labor course:  Augmentation with: AROM, Pitocin, and IP Foley. She then progressed to complete.  ROM: 12h 72m with clear fluid  Birth: At 0156 a viable female was delivered via spontaneous vaginal delivery (Presentation: OP ). Nuchal cord present: No.  Shoulders and body delivered in usual fashion. Infant placed directly on mom's abdomen for bonding/skin-to-skin, baby dried and stimulated. Cord clamped x 2 after 1 minute and cut by FOB.  Cord blood collected.  The placenta separated spontaneously and delivered via gentle cord traction.  Pitocin infused rapidly IV per protocol.  Fundus initially boggy, but became firm with massage after uterine sweep. Straight catherization performed yielding 100cc clear urine. Placenta inspected and appears to be intact with a 3 VC.  Placenta/Cord with the following complications: none .  Cord pH: not collected. Sponge and instrument count were correct x2.  Intrapartum complications:  Gestational Diabetes, diet controlled Anesthesia:  epidural Episiotomy: none Lacerations:  none Suture Repair:  na EBL (mL): 20   Infant: APGAR (1 MIN): 7  APGAR (5 MINS): 8  APGAR (10 MINS):    Infant weight: pending  Mom to  postpartum . Baby to Couplet care / Skin to Skin. Placenta to L&D   Plans to Bottlefeed Contraception:  undecided Circumcision: declines  Note sent to [facility]: FT for pp visit.  D[place] Ar  01/31/2024 2:19 AM  Attestation of Supervision of Resident:  I confirm that I have verified the information documented in the resident's note and that I was gloved and present for the entirety of the delivery.  I have verified that all services and findings are accurately documented in this resident's note; and I agree with management and plan as outlined in the documentation. I have also made  any necessary editorial changes.  Sundra Aland, MD OB Fellow, Faculty Practice [facility], Center for [facility]

## 2023-11-14 ENCOUNTER — Other Ambulatory Visit (HOSPITAL_COMMUNITY)
Admission: RE | Admit: 2023-11-14 | Discharge: 2023-11-14 | Disposition: A | Discharge status: Home / Self Care | Payer: Medicaid Other | Source: Ambulatory Visit | Attending: Obstetrics & Gynecology | Admitting: Obstetrics & Gynecology

## 2023-11-14 ENCOUNTER — Other Ambulatory Visit: Payer: Medicaid Other

## 2023-11-14 ENCOUNTER — Ambulatory Visit: Payer: Medicaid Other | Admitting: Radiology

## 2023-11-14 ENCOUNTER — Encounter: Payer: Self-pay | Admitting: Obstetrics & Gynecology

## 2023-11-14 ENCOUNTER — Ambulatory Visit (INDEPENDENT_AMBULATORY_CARE_PROVIDER_SITE_OTHER): Payer: Medicaid Other | Admitting: Obstetrics & Gynecology

## 2023-11-14 VITALS — BP 149/93 | HR 89 | Wt 228.2 lb

## 2023-11-14 DIAGNOSIS — Z3A26 26 weeks gestation of pregnancy: Secondary | ICD-10-CM | POA: Insufficient documentation

## 2023-11-14 DIAGNOSIS — J00 Acute nasopharyngitis [common cold]: Secondary | ICD-10-CM | POA: Diagnosis not present

## 2023-11-14 DIAGNOSIS — O10912 Unspecified pre-existing hypertension complicating pregnancy, second trimester: Secondary | ICD-10-CM | POA: Diagnosis not present

## 2023-11-14 DIAGNOSIS — O0992 Supervision of high risk pregnancy, unspecified, second trimester: Secondary | ICD-10-CM

## 2023-11-14 DIAGNOSIS — N898 Other specified noninflammatory disorders of vagina: Secondary | ICD-10-CM

## 2023-11-14 DIAGNOSIS — Z131 Encounter for screening for diabetes mellitus: Secondary | ICD-10-CM

## 2023-11-14 DIAGNOSIS — O10919 Unspecified pre-existing hypertension complicating pregnancy, unspecified trimester: Secondary | ICD-10-CM

## 2023-11-14 DIAGNOSIS — Z3A25 25 weeks gestation of pregnancy: Secondary | ICD-10-CM

## 2023-11-14 DIAGNOSIS — O10012 Pre-existing essential hypertension complicating pregnancy, second trimester: Secondary | ICD-10-CM

## 2023-11-14 NOTE — Progress Notes (Signed)
 HIGH-RISK PREGNANCY VISIT Patient name: Kaitlin Torres MRN [id number]  Date of birth: February 20, 1981 Chief Complaint:   Routine Prenatal Visit  History of Present Illness:   Kaitlin Torres is a 43 y.o. G48P3003 female at [redacted]w[redacted]d with an Estimated Date of Delivery: 02/20/24 being seen today for ongoing management of a high-risk pregnancy complicated by:  -Chronic HTN with h/o MI Followed by cardiology Currently on Labetalol  100mg  bid  Today she reports  notes cough, sore throat, runny nose since Jan 1.  Other than humidifier she has not taken any medication .   She also notes white discharge- denies itching or odor.   Contractions: Not present. Vag. Bleeding: None.  Movement: Present. denies leaking of fluid.      08/22/2023    2:45 PM 07/06/2023    3:48 PM 12/22/2022    3:55 PM 11/01/2022    9:36 AM 05/17/2022   11:49 AM  Depression screen PHQ 2/9  Decreased Interest 0 0 0 0 0  Down, Depressed, Hopeless 0 0 0 0 0  PHQ - 2 Score 0 0 0 0 0  Altered sleeping 3 0 0 0 0  Tired, decreased energy 3 3 0 0 0  Change in appetite 0 0 0 0 0  Feeling bad or failure about yourself  0 0 0 0 0  Trouble concentrating 0 0 0 0 0  Moving slowly or fidgety/restless 0 0 0 0 0  Suicidal thoughts 0 0 0 0 0  PHQ-9 Score 6 3 0 0 0  Difficult doing work/chores   Not difficult at all Not difficult at all Not difficult at all     Current Outpatient Medications  Medication Instructions   aspirin  EC 162 mg, Oral, Daily, Swallow whole.   Blood Pressure Monitor MISC For regular home bp monitoring during pregnancy   labetalol  (NORMODYNE ) 100 mg, Oral, 2 times daily   Prenatal Vit-Fe Sulfate-FA-DHA (PRENATAL VITAMIN/MIN +DHA) 27-0.8-200 MG CAPS 1 tablet, Oral, Daily     Review of Systems:   Pertinent items are noted in HPI Denies abnormal vaginal discharge w/ itching/odor/irritation, headaches, visual changes, shortness of breath, chest pain, abdominal pain, severe nausea/vomiting, or problems with  urination or bowel movements unless otherwise stated above. Pertinent History Reviewed:  Reviewed past medical,surgical, social, obstetrical and family history.  Reviewed problem list, medications and allergies. Physical Assessment:   Vitals:   11/14/23 1008 11/14/23 1021  BP: (!) 143/94 (!) 149/93  Pulse: 84 89  Weight: 228 lb 3.2 oz (103.5 kg)   Body mass index is 33.7 kg/m.  Pt did not take BP medication today           Physical Examination:   General appearance: alert, well appearing, and in no distress  Mental status: normal mood, behavior, speech, dress, motor activity, and thought processes  Skin: warm & dry   Extremities:      Cardiovascular: normal heart rate noted  Respiratory: normal respiratory effort, no distress  Abdomen: gravid, soft, non-tender  Pelvic: Cervical exam deferred  pt declined pelvic exam for vaginal swab        Fetal Status:     Movement: Present    Fetal Surveillance Testing today: Single active female fetus, Dempsey Breech,  FHR = 143 bpm  Anterior pl, gr1, single small intramural fibroid lower anterior wall = 32 x 22 mm  Nl amn fluid volume, MVP = 4.4 cm   EFW 13.8%  AC 13.8% CL = 5.2 cm,  closed,  normal ovaries, neg CDS - no free fluid   Chaperone: N/A    No results found for this or any previous visit (from the past 24 hours).   Assessment & Plan:  High-risk pregnancy: G4P3003 at [redacted]w[redacted]d with an Estimated Date of Delivery: 02/20/24   1. [redacted] weeks gestation of pregnancy -PN2 to be completed next visit  2. Vaginal discharge Self-swab completed, await results for treatment  3. Chronic hypertension affecting pregnancy -Reviewed concern of non-compliance vs plan to increase medication -Plan to follow up in one week and recheck BP -On aspirin  daily -continue growth q 4wks, antepartum testing to start @ 32wks  5. Acute nasopharyngitis Reviewed OTC remedies, if no improvement by Friday would then start antibiotic   Meds: No orders of the  defined types were placed in this encounter.   Labs/procedures today: none  Treatment Plan:  routine OB care and as outlined above  Reviewed: Preterm labor symptoms and general obstetric precautions including but not limited to vaginal bleeding, contractions, leaking of fluid and fetal movement were reviewed in detail with the patient.  All questions were answered. Pt has home bp cuff. Check bp weekly, let us  know if >140/90.   Follow-up: Return in about 1 week (around 11/21/2023) for HROB/PN2 visit, continue growth every 4 weeks.   Future Appointments  Date Time Provider Department Center  11/21/2023  8:45 AM [facility]  11/21/2023  9:30 AM Jayne Vonn DEL, MD [facility]  12/12/2023  1:30 PM [facility] IMG 2 [facility] None  12/12/2023  2:30 PM Jayne Vonn DEL, MD [facility]  12/13/2023  1:40 PM Tobb, Dub, DO [facility] None  12/26/2023  1:30 PM [facility] IMG 2 [facility] None  12/26/2023  2:30 PM Kizzie Suzen SAUNDERS, CNM [facility]  12/29/2023 11:10 AM [facility] NURSE [facility]  01/02/2024  1:30 PM [facility] IMG 2 [facility] None  01/02/2024  2:30 PM Kizzie Suzen SAUNDERS, CNM [facility]  01/05/2024 10:50 AM [facility] NURSE [facility]  01/09/2024  1:30 PM [facility] IMG 2 [facility] None  01/12/2024 10:50 AM [facility] NURSE [facility]  01/16/2024  1:30 PM [facility] - [facility] IMG 2 [facility] None  01/19/2024 10:50 AM [facility] NURSE [facility]  01/23/2024  1:30 PM [facility] IMG 2 [facility] None  01/26/2024 10:50 AM [facility] NURSE [facility]  01/30/2024  1:30 PM [facility] IMG 2 [facility] None  02/02/2024 10:50 AM [facility] NURSE [facility]  02/06/2024  1:30 PM [facility] IMG 2 [facility] None  02/09/2024 10:50 AM [facility] NURSE [facility]  02/13/2024  1:30 PM [facility] IMG 2 [facility] None  02/16/2024 10:50 AM [facility] NURSE [facility]    No orders of the defined types were placed in this encounter.   Chrysa Rampy, DO Attending Obstetrician &  Gynecologist, [facility] for [employer], [facility]

## 2023-11-14 NOTE — Progress Notes (Signed)
 GA = 26 weeks  Single active female fetus, Kaitlin Torres Breech,  FHR = 143 bpm  Anterior pl, gr1, single small intramural fibroid lower anterior wall = 32 x 22 mm  Nl amn fluid volume, MVP = 4.4 cm   EFW 13.8%  AC 13.8% CL = 5.2 cm,  closed,  normal ovaries, neg CDS - no free fluid

## 2023-11-15 LAB — CERVICOVAGINAL ANCILLARY ONLY
Bacterial Vaginitis (gardnerella): NEGATIVE
Candida Glabrata: NEGATIVE
Candida Vaginitis: NEGATIVE
Comment: NEGATIVE
Comment: NEGATIVE
Comment: NEGATIVE

## 2023-11-21 ENCOUNTER — Other Ambulatory Visit: Payer: Medicaid Other

## 2023-11-21 ENCOUNTER — Ambulatory Visit (INDEPENDENT_AMBULATORY_CARE_PROVIDER_SITE_OTHER): Payer: Medicaid Other | Admitting: Obstetrics & Gynecology

## 2023-11-21 VITALS — BP 118/75 | HR 80 | Wt 227.0 lb

## 2023-11-21 DIAGNOSIS — Z131 Encounter for screening for diabetes mellitus: Secondary | ICD-10-CM | POA: Diagnosis not present

## 2023-11-21 DIAGNOSIS — Z3A27 27 weeks gestation of pregnancy: Secondary | ICD-10-CM | POA: Diagnosis not present

## 2023-11-21 DIAGNOSIS — O10919 Unspecified pre-existing hypertension complicating pregnancy, unspecified trimester: Secondary | ICD-10-CM

## 2023-11-21 DIAGNOSIS — O0992 Supervision of high risk pregnancy, unspecified, second trimester: Secondary | ICD-10-CM

## 2023-11-21 DIAGNOSIS — I252 Old myocardial infarction: Secondary | ICD-10-CM

## 2023-11-21 DIAGNOSIS — Z3A25 25 weeks gestation of pregnancy: Secondary | ICD-10-CM | POA: Diagnosis not present

## 2023-11-21 DIAGNOSIS — O10912 Unspecified pre-existing hypertension complicating pregnancy, second trimester: Secondary | ICD-10-CM

## 2023-11-21 NOTE — Progress Notes (Signed)
 HIGH-RISK PREGNANCY VISIT Patient name: Kaitlin Torres MRN [id number]  Date of birth: [birth year] Chief Complaint:   Routine Prenatal Visit  History of Present Illness:   Kaitlin Torres is a 43 y.o. G21P3003 female at [redacted]w[redacted]d with an Estimated Date of Delivery: 02/20/24 being seen today for ongoing management of a high-risk pregnancy complicated by     ICD-10-CM   1. Supervision of high risk pregnancy in second trimester  O09.92     2. Chronic hypertension affecting pregnancy: labetalol  100 mg BID  O10.919     3. History of MI (myocardial infarction): S/P stent  I25.2    seeing Dr Sheena again on 12/13/23     .    Today she reports no complaints. Contractions: Not present. Vag. Bleeding: None.  Movement: Present. denies leaking of fluid.      08/22/2023    2:45 PM 07/06/2023    3:48 PM 12/22/2022    3:55 PM 11/01/2022    9:36 AM 05/17/2022   11:49 AM  Depression screen PHQ 2/9  Decreased Interest 0 0 0 0 0  Down, Depressed, Hopeless 0 0 0 0 0  PHQ - 2 Score 0 0 0 0 0  Altered sleeping 3 0 0 0 0  Tired, decreased energy 3 3 0 0 0  Change in appetite 0 0 0 0 0  Feeling bad or failure about yourself  0 0 0 0 0  Trouble concentrating 0 0 0 0 0  Moving slowly or fidgety/restless 0 0 0 0 0  Suicidal thoughts 0 0 0 0 0  PHQ-9 Score 6 3 0 0 0  Difficult doing work/chores   Not difficult at all Not difficult at all Not difficult at all        08/22/2023    2:45 PM 07/06/2023    3:48 PM 12/22/2022    3:56 PM 11/01/2022    9:37 AM  GAD 7 : Generalized Anxiety Score  Nervous, Anxious, on Edge 0 0 0   Control/stop worrying 0 0 0   Worry too much - different things 0 0 0   Trouble relaxing 0 0 0   Restless 0 0 1 1  Easily annoyed or irritable 0 2 0   Afraid - awful might happen 0 0 0   Total GAD 7 Score 0 2 1   Anxiety Difficulty   Not difficult at all      Review of Systems:   Pertinent items are noted in HPI Denies abnormal vaginal discharge w/ itching/odor/irritation,  headaches, visual changes, shortness of breath, chest pain, abdominal pain, severe nausea/vomiting, or problems with urination or bowel movements unless otherwise stated above. Pertinent History Reviewed:  Reviewed past medical,surgical, social, obstetrical and family history.  Reviewed problem list, medications and allergies. Physical Assessment:   Vitals:   11/21/23 0855  BP: 118/75  Pulse: 80  Weight: 227 lb (103 kg)  Body mass index is 33.52 kg/m.           Physical Examination:   General appearance: alert, well appearing, and in no distress  Mental status: alert, oriented to person, place, and time  Skin: warm & dry   Extremities:   no swelling at all   Cardiovascular: normal heart rate noted  Respiratory: normal respiratory effort, no distress  Abdomen: gravid, soft, non-tender  Pelvic: Cervical exam deferred         Fetal Status:     Movement: Present    Fetal Surveillance  Testing today: FHR 140   Chaperone: N/A    No results found for this or any previous visit (from the past 24 hours).  Assessment & Plan:  High-risk pregnancy: [id number] at [redacted]w[redacted]d with an Estimated Date of Delivery: 02/20/24      ICD-10-CM   1. Supervision of high risk pregnancy in second trimester  O09.92     2. Chronic hypertension affecting pregnancy: labetalol  100 mg BID  O10.919     3. History of MI (myocardial infarction): S/P stent  I25.2    seeing Dr Sheena again on 12/13/23        Meds: No orders of the defined types were placed in this encounter.   Orders: No orders of the defined types were placed in this encounter.    Labs/procedures today: none  Treatment Plan:  current care plan + OB Cards care with Dr Sheena    Follow-up: No follow-ups on file.   Future Appointments  Date Time Provider Department Center  11/21/2023  9:30 AM Jayne Vonn DEL, MD [facility]  12/12/2023  1:30 PM [facility] - FT IMG 2 [facility] None  12/12/2023  2:30 PM Jayne Vonn DEL, MD [facility]  12/13/2023  1:40  PM Tobb, Dub, DO [facility] None  12/26/2023  1:30 PM [facility] IMG 2 [facility] None  12/26/2023  2:30 PM Kizzie Suzen SAUNDERS, CNM [facility]  12/29/2023 11:10 AM [facility] NURSE [facility]  01/02/2024  1:30 PM [facility] IMG 2 [facility] None  01/02/2024  2:30 PM Kizzie Suzen SAUNDERS, CNM [facility]  01/05/2024 10:50 AM [facility] NURSE [facility]  01/09/2024  1:30 PM [facility] FT IMG 2 [facility] None  01/12/2024 10:50 AM [facility] NURSE [facility]  01/16/2024  1:30 PM [facility] FT IMG 2 [facility] None  01/19/2024 10:50 AM [facility] NURSE [facility]  01/23/2024  1:30 PM [facility] FT IMG 2 [facility] None  01/26/2024 10:50 AM [facility] NURSE [facility]  01/30/2024  1:30 PM [facility] IMG 2 [facility] None  02/02/2024 10:50 AM [facility] NURSE [facility]  02/06/2024  1:30 PM [facility] IMG 2 [facility] None  02/09/2024 10:50 AM [facility] NURSE [facility]  02/13/2024  1:30 PM [facility] FT IMG 2 [facility] None  02/16/2024 10:50 AM [facility] NURSE [facility]    No orders of the defined types were placed in this encounter.  Vonn DEL Jayne  Attending Physician for the Center for [facility] 11/21/2023 9:17 AM

## 2023-11-22 ENCOUNTER — Encounter: Payer: Self-pay | Admitting: Obstetrics & Gynecology

## 2023-11-22 DIAGNOSIS — Z8632 Personal history of gestational diabetes: Secondary | ICD-10-CM | POA: Insufficient documentation

## 2023-11-22 DIAGNOSIS — O24419 Gestational diabetes mellitus in pregnancy, unspecified control: Secondary | ICD-10-CM | POA: Insufficient documentation

## 2023-11-23 ENCOUNTER — Encounter: Payer: Self-pay | Admitting: *Deleted

## 2023-11-23 ENCOUNTER — Other Ambulatory Visit: Payer: Self-pay | Admitting: Obstetrics & Gynecology

## 2023-11-23 DIAGNOSIS — O24410 Gestational diabetes mellitus in pregnancy, diet controlled: Secondary | ICD-10-CM

## 2023-11-23 MED ORDER — ACCU-CHEK GUIDE ME W/DEVICE KIT: 1.0000 | PACK | Freq: Four times a day (QID) | 0 refills | Status: DC

## 2023-11-23 MED ORDER — GLUCOSE BLOOD VI STRP: ORAL_STRIP | 12 refills | Status: DC

## 2023-11-23 MED ORDER — ACCU-CHEK SOFTCLIX LANCETS MISC: 12 refills | Status: DC

## 2023-11-29 LAB — CBC
Hematocrit: 38.1 % (ref 34.0–46.6)
Hemoglobin: 11.7 g/dL (ref 11.1–15.9)
MCH: 22.5 pg — ABNORMAL LOW (ref 26.6–33.0)
MCHC: 30.7 g/dL — ABNORMAL LOW (ref 31.5–35.7)
MCV: 73 fL — ABNORMAL LOW (ref 79–97)
Platelets: 227 10*3/uL (ref 150–450)
RBC: 5.21 x10E6/uL (ref 3.77–5.28)
RDW: 17.9 % — ABNORMAL HIGH (ref 11.7–15.4)
WBC: 12.6 10*3/uL — ABNORMAL HIGH (ref 3.4–10.8)

## 2023-11-29 LAB — GLUCOSE TOLERANCE, 2 HOURS W/ 1HR
Glucose, 1 hour: 212 mg/dL — ABNORMAL HIGH (ref 70–179)
Glucose, 2 hour: 173 mg/dL — ABNORMAL HIGH (ref 70–152)
Glucose, Fasting: 86 mg/dL (ref 70–91)

## 2023-11-29 LAB — HIV ANTIBODY (ROUTINE TESTING W REFLEX): HIV Screen 4th Generation wRfx: NONREACTIVE

## 2023-11-29 LAB — ANTIBODY SCREEN

## 2023-11-29 LAB — RPR: RPR Ser Ql: NONREACTIVE

## 2023-11-29 LAB — AB SCR+ANTIBODY ID: Antibody Screen: POSITIVE — AB

## 2023-11-30 ENCOUNTER — Other Ambulatory Visit: Payer: Medicaid Other

## 2023-12-07 ENCOUNTER — Other Ambulatory Visit: Payer: Self-pay

## 2023-12-07 ENCOUNTER — Ambulatory Visit: Payer: Medicaid Other | Admitting: Dietician

## 2023-12-07 ENCOUNTER — Encounter: Payer: Medicaid Other | Attending: Obstetrics & Gynecology | Admitting: Dietician

## 2023-12-07 DIAGNOSIS — O24419 Gestational diabetes mellitus in pregnancy, unspecified control: Secondary | ICD-10-CM

## 2023-12-07 DIAGNOSIS — Z3A29 29 weeks gestation of pregnancy: Secondary | ICD-10-CM

## 2023-12-07 DIAGNOSIS — O24410 Gestational diabetes mellitus in pregnancy, diet controlled: Secondary | ICD-10-CM | POA: Insufficient documentation

## 2023-12-07 DIAGNOSIS — Z713 Dietary counseling and surveillance: Secondary | ICD-10-CM | POA: Diagnosis not present

## 2023-12-07 NOTE — Progress Notes (Signed)
Patient was seen for Gestational Diabetes/Pre-existing Diabetes During Pregnancy on 12/07/2023  Start time 1410 and End time 1445   Estimated due date: 02/20/2024; [redacted]w[redacted]d   Clinical: Medications:   Current Outpatient Medications:    Accu-Chek Softclix Lancets lancets, Use as instructed to check blood sugar 4 times daily, Disp: 100 each, Rfl: 12   aspirin EC 81 MG tablet, Take 2 tablets (162 mg total) by mouth daily. Swallow whole., Disp: 180 tablet, Rfl: 2   Blood Glucose Monitoring Suppl (ACCU-CHEK GUIDE ME) w/Device KIT, 1 each by Does not apply route 4 (four) times daily., Disp: 1 kit, Rfl: 0   glucose blood test strip, Use as instructed to check blood sugar four times daily, Disp: 100 each, Rfl: 12   labetalol (NORMODYNE) 100 MG tablet, Take 1 tablet (100 mg total) by mouth 2 (two) times daily., Disp: 180 tablet, Rfl: 2   Prenatal Vit-Fe Sulfate-FA-DHA (PRENATAL VITAMIN/MIN +DHA) 27-0.8-200 MG CAPS, Take 1 tablet by mouth daily., Disp: 100 capsule, Rfl: 11   Blood Pressure Monitor MISC, For regular home bp monitoring during pregnancy (Patient not taking: Reported on 12/07/2023), Disp: 1 each, Rfl: 0    Medical History:  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 Pt reports she typically goes to sleep around 6-8 am and wake up near 1 pm. Pt report her meal schedule is not consistent. Pt reports monitoring blood sugar "5 minutes" after meal intake and reports the following values "129-139" mg/dL Pt  reports she lives with her boyfriend and a teenager and adult child. Pt reports she does the shopping and cooking. All Pt's questions were answered during this encounter.   Physical Activity: walking at work  Stress: 2 out of 10 /self care computer games, napping  Sleep: good   24 hr Recall:  First Meal: ~ 2 pm: skips or cheese burger wendy [employer], water Snack: skips Second meal: ~ 10 pm skips or 1 frozen pizza, water Snack: Third meal: 12-1 am: steak, peppers, onions, mushroom, rice , water Snack: 7:30 am [employer] cajun chicken biscuit with lettuce and tomato, water Beverages: water, sprite   NUTRITION INTERVENTION  Nutrition education (E-1) on the following topics:   Initial Follow-up  [x]  []  Definition of Gestational Diabetes [x]  []  Why dietary management is important in controlling blood glucose [x]  []  Effects each nutrient has on blood glucose levels [x]  []  Simple carbohydrates vs complex carbohydrates [x]  []  Fluid intake [x]  []  Creating a balanced meal plan [x]  []  Carbohydrate counting  [x]  []  When to check blood glucose levels [x]  []  Proper blood glucose monitoring techniques [x]  []  Effect of stress and stress reduction techniques  [x]  []  Exercise effect on blood glucose levels, appropriate exercise during pregnancy [x]  []  Importance of limiting caffeine and abstaining from alcohol and smoking [x]  []  Medications used for blood sugar control during pregnancy [x]  []  Hypoglycemia and rule  of 15 [x]  []  Postpartum self care   Patient has a meter prior to visit. Patient is instructed testing pre breakfast and 2 hours after each meal. FBS: 88, 91 mg/dL Postprandial: Pt encouraged to monitor 2 hour post prandial, document on log sheet and bring to each visit.   Patient instructed to monitor glucose levels: QID FBS: 60 - <= 95 mg/dL; 2 hour: <= 161 mg/dL  Patient received handouts: Nutrition Diabetes and Pregnancy Carbohydrate Counting List Blood glucose  log Snack ideas for diabetes during pregnancy  Patient will be seen for follow-up as needed.

## 2023-12-11 ENCOUNTER — Other Ambulatory Visit: Payer: Self-pay | Admitting: Obstetrics & Gynecology

## 2023-12-11 DIAGNOSIS — Z3A18 18 weeks gestation of pregnancy: Secondary | ICD-10-CM

## 2023-12-11 DIAGNOSIS — Z124 Encounter for screening for malignant neoplasm of cervix: Secondary | ICD-10-CM

## 2023-12-11 DIAGNOSIS — O0992 Supervision of high risk pregnancy, unspecified, second trimester: Secondary | ICD-10-CM

## 2023-12-11 DIAGNOSIS — O10919 Unspecified pre-existing hypertension complicating pregnancy, unspecified trimester: Secondary | ICD-10-CM

## 2023-12-11 DIAGNOSIS — O09523 Supervision of elderly multigravida, third trimester: Secondary | ICD-10-CM

## 2023-12-11 DIAGNOSIS — O24410 Gestational diabetes mellitus in pregnancy, diet controlled: Secondary | ICD-10-CM

## 2023-12-11 DIAGNOSIS — Z1379 Encounter for other screening for genetic and chromosomal anomalies: Secondary | ICD-10-CM

## 2023-12-12 ENCOUNTER — Ambulatory Visit: Payer: Medicaid Other | Admitting: Radiology

## 2023-12-12 ENCOUNTER — Other Ambulatory Visit: Payer: Self-pay | Admitting: Obstetrics & Gynecology

## 2023-12-12 ENCOUNTER — Ambulatory Visit: Payer: Medicaid Other | Admitting: Obstetrics & Gynecology

## 2023-12-12 ENCOUNTER — Encounter: Payer: Self-pay | Admitting: Obstetrics & Gynecology

## 2023-12-12 ENCOUNTER — Other Ambulatory Visit: Payer: Medicaid Other | Admitting: Radiology

## 2023-12-12 VITALS — BP 137/91 | HR 81 | Wt 231.0 lb

## 2023-12-12 DIAGNOSIS — O0992 Supervision of high risk pregnancy, unspecified, second trimester: Secondary | ICD-10-CM

## 2023-12-12 DIAGNOSIS — O24419 Gestational diabetes mellitus in pregnancy, unspecified control: Secondary | ICD-10-CM

## 2023-12-12 DIAGNOSIS — O10919 Unspecified pre-existing hypertension complicating pregnancy, unspecified trimester: Secondary | ICD-10-CM

## 2023-12-12 DIAGNOSIS — O09523 Supervision of elderly multigravida, third trimester: Secondary | ICD-10-CM | POA: Diagnosis not present

## 2023-12-12 DIAGNOSIS — O24410 Gestational diabetes mellitus in pregnancy, diet controlled: Secondary | ICD-10-CM | POA: Diagnosis not present

## 2023-12-12 DIAGNOSIS — O0993 Supervision of high risk pregnancy, unspecified, third trimester: Secondary | ICD-10-CM | POA: Diagnosis not present

## 2023-12-12 DIAGNOSIS — O10913 Unspecified pre-existing hypertension complicating pregnancy, third trimester: Secondary | ICD-10-CM

## 2023-12-12 DIAGNOSIS — Z3A30 30 weeks gestation of pregnancy: Secondary | ICD-10-CM | POA: Diagnosis not present

## 2023-12-12 DIAGNOSIS — Z1389 Encounter for screening for other disorder: Secondary | ICD-10-CM | POA: Diagnosis not present

## 2023-12-12 DIAGNOSIS — Z331 Pregnant state, incidental: Secondary | ICD-10-CM

## 2023-12-12 LAB — POCT URINALYSIS DIPSTICK OB
Blood, UA: NEGATIVE
Glucose, UA: NEGATIVE
Ketones, UA: NEGATIVE
Leukocytes, UA: NEGATIVE
Nitrite, UA: NEGATIVE
POC,PROTEIN,UA: NEGATIVE

## 2023-12-12 NOTE — Progress Notes (Signed)
 HIGH-RISK PREGNANCY VISIT Patient name: Kaitlin Torres MRN [id number]  Date of birth: 01-23-1981 Chief Complaint:   Routine Prenatal Visit and Pregnancy Ultrasound  History of Present Illness:   Kaitlin Torres is a 42 y.o. G75P3003 female at [redacted]w[redacted]d with an Estimated Date of Delivery: 02/20/24 being seen today for ongoing management of a high-risk pregnancy complicated by cHTN on labetalol , Hx of MI 2020, A1DM.    Today she reports no complaints. Contractions: Not present.  .  Movement: Present. denies leaking of fluid.      08/22/2023    2:45 PM 07/06/2023    3:48 PM 12/22/2022    3:55 PM 11/01/2022    9:36 AM 05/17/2022   11:49 AM  Depression screen PHQ 2/9  Decreased Interest 0 0 0 0 0  Down, Depressed, Hopeless 0 0 0 0 0  PHQ - 2 Score 0 0 0 0 0  Altered sleeping 3 0 0 0 0  Tired, decreased energy 3 3 0 0 0  Change in appetite 0 0 0 0 0  Feeling bad or failure about yourself  0 0 0 0 0  Trouble concentrating 0 0 0 0 0  Moving slowly or fidgety/restless 0 0 0 0 0  Suicidal thoughts 0 0 0 0 0  PHQ-9 Score 6 3 0 0 0  Difficult doing work/chores   Not difficult at all Not difficult at all Not difficult at all        08/22/2023    2:45 PM 07/06/2023    3:48 PM 12/22/2022    3:56 PM 11/01/2022    9:37 AM  GAD 7 : Generalized Anxiety Score  Nervous, Anxious, on Edge 0 0 0   Control/stop worrying 0 0 0   Worry too much - different things 0 0 0   Trouble relaxing 0 0 0   Restless 0 0 1 1  Easily annoyed or irritable 0 2 0   Afraid - awful might happen 0 0 0   Total GAD 7 Score 0 2 1   Anxiety Difficulty   Not difficult at all      Review of Systems:   Pertinent items are noted in HPI Denies abnormal vaginal discharge w/ itching/odor/irritation, headaches, visual changes, shortness of breath, chest pain, abdominal pain, severe nausea/vomiting, or problems with urination or bowel movements unless otherwise stated above. Pertinent History Reviewed:  Reviewed past  medical,surgical, social, obstetrical and family history.  Reviewed problem list, medications and allergies. Physical Assessment:   Vitals:   12/12/23 1415  BP: (!) 137/91  Pulse: 81  Weight: 231 lb (104.8 kg)  Body mass index is 34.11 kg/m.           Physical Examination:   General appearance: alert, well appearing, and in no distress  Mental status: alert, oriented to person, place, and time  Skin: warm & dry   Extremities: Edema: None    Cardiovascular: normal heart rate noted  Respiratory: normal respiratory effort, no distress  Abdomen: gravid, soft, non-tender  Pelvic: Cervical exam deferred         Fetal Status:     Movement: Present    Fetal Surveillance Testing today: BPP 8/8   Chaperone: N/A    Results for orders placed or performed in visit on 12/12/23 (from the past 24 hours)  POC Urinalysis Dipstick OB   Collection Time: 12/12/23  2:22 PM  Result Value Ref Range   Color, UA     Clarity,  UA     Glucose, UA Negative Negative   Bilirubin, UA     Ketones, UA negative    Spec Grav, UA     Blood, UA negative    pH, UA     POC,PROTEIN,UA Negative Negative, Trace, Small (1+), Moderate (2+), Large (3+), 4+   Urobilinogen, UA     Nitrite, UA negative    Leukocytes, UA Negative Negative   Appearance     Odor      Assessment & Plan:  High-risk pregnancy: [id number] at [redacted]w[redacted]d with an Estimated Date of Delivery: 02/20/24      ICD-10-CM   1. Supervision of high risk pregnancy in second trimester  O09.92     2. [redacted] weeks gestation of pregnancy  Z3A.30     3. Pregnant state, incidental  Z33.1 POC Urinalysis Dipstick OB    4. Screening for genitourinary condition  Z13.89 POC Urinalysis Dipstick OB       Meds: No orders of the defined types were placed in this encounter.   Orders:  Orders Placed This Encounter  Procedures   POC Urinalysis Dipstick OB     Labs/procedures today: U/S  Treatment Plan:  BPP next week then twice weekly  Reviewed: Preterm labor  symptoms and general obstetric precautions including but not limited to vaginal bleeding, contractions, leaking of fluid and fetal movement were reviewed in detail with the patient.  All questions were answered. Does have home bp cuff. Office bp cuff given: not applicable. Check bp daily, let us  know if consistently >140 and/or >90.  Follow-up: No follow-ups on file.   Future Appointments  Date Time Provider Department Center  12/13/2023  1:40 PM Tobb, Dub, DO [facility] None  12/26/2023  1:30 PM [facility] 2 [facility] None  12/26/2023  2:30 PM Kizzie Suzen SAUNDERS, CNM [facility]  12/29/2023 11:10 AM [facility] NURSE [facility]  01/02/2024  1:30 PM [facility] None  01/02/2024  2:30 PM Kizzie Suzen SAUNDERS, CNM [facility]  01/05/2024 10:50 AM [facility] NURSE [facility]  01/09/2024  1:30 PM [facility] None  01/09/2024  2:30 PM Kizzie Suzen SAUNDERS, CNM [facility]  01/12/2024 10:50 AM [facility] NURSE [facility]  01/16/2024  1:30 PM [facility] 2 [facility] None  01/16/2024  2:30 PM Kizzie Suzen SAUNDERS, CNM [facility]  01/19/2024 10:50 AM [facility] NURSE [facility]  01/23/2024  1:30 PM [facility] None  01/23/2024  2:50 PM Marilynn Nest, DO [facility]  01/26/2024 10:50 AM [facility] NURSE [facility]  01/30/2024  1:30 PM [facility] None  01/30/2024  2:30 PM Kizzie Suzen SAUNDERS, CNM [facility]  02/02/2024 10:50 AM [facility] NURSE [facility]  02/06/2024  1:30 PM [facility] 2 [facility] None  02/09/2024 10:50 AM [facility] NURSE [facility]  02/13/2024  1:30 PM [facility] None  02/16/2024 10:50 AM [facility] NURSE [facility]    Orders Placed This Encounter  Procedures   POC Urinalysis Dipstick OB   Vonn VEAR Inch  Attending Physician for the Center for [facility] 12/12/2023 2:44 PM

## 2023-12-12 NOTE — Progress Notes (Signed)
 US :  GA = 30 weeks Single active female fetus, cephalic, FHR = 144 bpm,  anterior pl, gr1 AFI = 13.5 cm, 38%, MVP = 5.3 cm  EFW 23% 1407g  BPP = 8/8  RI: 0.72, 0.70  82% Cervix appears long and closed - normal ov's   small intramural fibroid seen in lower right ut wall = 25 x 21 mm

## 2023-12-13 ENCOUNTER — Ambulatory Visit: Payer: Medicaid Other | Attending: Cardiology | Admitting: Cardiology

## 2023-12-13 ENCOUNTER — Encounter: Payer: Self-pay | Admitting: Cardiology

## 2023-12-13 VITALS — BP 130/82 | HR 73 | Ht 69.0 in | Wt 233.0 lb

## 2023-12-13 DIAGNOSIS — Z3A23 23 weeks gestation of pregnancy: Secondary | ICD-10-CM

## 2023-12-13 DIAGNOSIS — O24410 Gestational diabetes mellitus in pregnancy, diet controlled: Secondary | ICD-10-CM

## 2023-12-13 DIAGNOSIS — I2510 Atherosclerotic heart disease of native coronary artery without angina pectoris: Secondary | ICD-10-CM

## 2023-12-13 DIAGNOSIS — O10919 Unspecified pre-existing hypertension complicating pregnancy, unspecified trimester: Secondary | ICD-10-CM

## 2023-12-13 NOTE — Patient Instructions (Addendum)
 Medication Instructions:  Your physician recommends that you continue on your current medications as directed. Please refer to the Current Medication list given to you today.  *If you need a refill on your cardiac medications before your next appointment, please call your pharmacy*  Follow-Up: At [facility], you and your health needs are our priority.  As part of our continuing mission to provide you with exceptional heart care, we have created designated Provider Care Teams.  These Care Teams include your primary Cardiologist (physician) and Advanced Practice Providers (APPs -  Physician Assistants and Nurse Practitioners) who all work together to provide you with the care you need, when you need it.   Your next appointment:   4 week(s) - overbook okay   Provider:   Kardie Tobb, DO     Other Instructions:

## 2023-12-13 NOTE — Progress Notes (Signed)
 Cardio-Obstetrics Clinic  New Evaluation  Date:  12/13/2023   ID:  Kaitlin Torres, DOB [birth year], MRN [id number]  PCP:  Severa Rock HERO, FNP   [facility] Providers Cardiologist:  Dub Huntsman, DO  Electrophysiologist:  None       Referring MD: Severa Rock HERO, FNP   Chief Complaint:  I am ok  History of Present Illness:    Kaitlin Torres is a 43 y.o. female [G4P3003] who is being seen today in follow up.    Medical hx include CAD s/p PCI to the RCA and OM vessels in 2020, Familial hypercholesteremia, obesity, Tobacco use/vapes, obesity, hypertension.   She is currently 23  weeks 2 days pregnant.  She did not take her medications at her last visit. Since that visit she has been taking her pills she offers no complaints.   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  Fibrocystic change.  Usual duct  epithelial hyperplasia.   CORONARY STENT INTERVENTION N/A 05/08/2019   Procedure: CORONARY STENT INTERVENTION;  Surgeon: Jordan, Peter M, MD;  Location: [facility] INVASIVE CV LAB;  Service: Cardiovascular;  Laterality: N/A;   FRACTURE SURGERY  2005   right ankle   HEMORRHOID SURGERY     HEMORRHOID SURGERY  2015   INCISION AND DRAINAGE BREAST ABSCESS  09/2010   left breast    LEFT HEART CATH AND CORONARY ANGIOGRAPHY N/A 05/08/2019   Procedure: LEFT HEART CATH AND CORONARY ANGIOGRAPHY;  Surgeon: Jordan, Peter M, MD;  Location[facility] INVASIVE CV LAB;  Service: Cardiovascular;   Laterality: N/A;      OB History     Gravida  4   Para  3   Term  3   Preterm      AB      Living  3      SAB      IAB      Ectopic      Multiple      Live Births  3               Current Medications: Current Meds  Medication Sig   Accu-Chek Softclix Lancets lancets Use as instructed to check blood sugar 4 times daily   aspirin  EC 81 MG tablet Take 2 tablets (162 mg total) by mouth daily. Swallow whole.   Blood Glucose Monitoring Suppl (ACCU-CHEK GUIDE ME) w/Device KIT 1 each by Does not apply route 4 (four) times daily.   Blood Pressure Monitor MISC For regular home bp monitoring during pregnancy   glucose blood test strip Use as instructed to check blood sugar four times daily   labetalol  (NORMODYNE ) 100 MG tablet Take 1 tablet (100 mg total) by mouth 2 (two) times daily.   Prenatal Vit-Fe Sulfate-FA-DHA (PRENATAL VITAMIN/MIN +DHA) 27-0.8-200 MG CAPS Take 1 tablet by mouth daily.     Allergies:   Diclofenac    Social History   Socioeconomic  History   Marital status: Single    Spouse name: Not on file   Number of children: 3   Years of education: 14   Highest education level: Associate degree: academic program  Occupational History   Occupation: Waitress  Tobacco Use   Smoking status: Former    Current packs/day: 0.00    Average packs/day: 0.1 packs/day for 26.0 years (2.6 ttl pk-yrs)    Types: Cigarettes    Start date: 09/15/1993    Quit date: 09/16/2019    Years since quitting: 4.2   Smokeless tobacco: Never  Vaping Use   Vaping status: Every Day   Substances: Nicotine  Substance and Sexual Activity   Alcohol use: No    Comment: quit 2018   Drug use: No   Sexual activity: Yes    Birth control/protection: None  Other Topics Concern   Not on file  Social History Narrative   Lives at home with her mother, husband and three daughters.   Right-handed.   Approximately 20 cups caffeine  per day (4-5 glasses holding 32oz of tea).   Social  Drivers of Health   Financial Resource Strain: Medium Risk (07/06/2023)   Overall Financial Resource Strain (CARDIA)    Difficulty of Paying Living Expenses: Somewhat hard  Food Insecurity: No Food Insecurity (12/07/2023)   Hunger Vital Sign    Worried About Running Out of Food in the Last Year: Never true    Ran Out of Food in the Last Year: Never true  Transportation Needs: No Transportation Needs (07/06/2023)   PRAPARE - Administrator, Civil Service (Medical): No    Lack of Transportation (Non-Medical): No  Physical Activity: Sufficiently Active (07/06/2023)   Exercise Vital Sign    Days of Exercise per Week: 5 days    Minutes of Exercise per Session: 90 min  Stress: No Stress Concern Present (07/06/2023)   [employer] of Occupational Health - Occupational Stress Questionnaire    Feeling of Stress : Only a little  Social Connections: Moderately Isolated (07/06/2023)   Social Connection and Isolation Panel [NHANES]    Frequency of Communication with Friends and Family: More than three times a week    Frequency of Social Gatherings with Friends and Family: More than three times a week    Attends Religious Services: Never    Database Administrator or Organizations: No    Attends Engineer, Structural: Never    Marital Status: Living with partner      Family History  Problem Relation Age of Onset   Hypertension Mother    Peripheral Artery Disease Mother    Heart disease Father        Cardiac arrest 2006   Diabetes Father    Hyperlipidemia Father    Stroke Father    Aneurysm Brother    Cervical cancer Maternal Grandmother    Diabetes Paternal Grandmother       ROS:   Please see the history of present illness.     All other systems reviewed and are negative.   Labs/EKG Reviewed:    EKG:   EKG was ordered today.  The ekg ordered today demonstrates sinus rhythm, heart rate 96 bpm  Recent Labs: 02/05/2023: B Natriuretic Peptide 16.0 08/23/2023:  ALT 14; BUN 5; Creatinine, Ser 0.47; Potassium 3.9; Sodium 136 11/21/2023: Hemoglobin 11.7; Platelets 227   Recent Lipid Panel Lab Results  Component Value Date/Time   CHOL 202 (H) 11/01/2022 09:49 AM   TRIG 103 11/01/2022  09:49 AM   HDL 36 (L) 11/01/2022 09:49 AM   CHOLHDL 5.6 (H) 11/01/2022 09:49 AM   CHOLHDL 10.0 05/09/2019 03:57 AM   LDLCALC 147 (H) 11/01/2022 09:49 AM    Physical Exam:    VS:  BP 130/82   Pulse 73   Ht 5' 9 (1.753 m)   Wt 233 lb (105.7 kg)   LMP 05/16/2023   SpO2 99%   BMI 34.41 kg/m     Wt Readings from Last 3 Encounters:  12/13/23 233 lb (105.7 kg)  12/12/23 231 lb (104.8 kg)  11/21/23 227 lb (103 kg)     GEN:  Well nourished, well developed in no acute distress HEENT: Normal NECK: No JVD; No carotid bruits LYMPHATICS: No lymphadenopathy CARDIAC: RRR, no murmurs, rubs, gallops RESPIRATORY:  Clear to auscultation without rales, wheezing or rhonchi  ABDOMEN: Soft, non-tender, non-distended MUSCULOSKELETAL:  No edema; No deformity  SKIN: Warm and dry NEUROLOGIC:  Alert and oriented x 3 PSYCHIATRIC:  Normal affect    Risk Assessment/Risk Calculators:                 ASSESSMENT & PLAN:    Coronary Artery Disease in Pregnancy Status post PCI with drug eluted stents to RCA and OM in 2020. Recent echo was normal.  Continue aspirin  81 mg daily. -Advise patient to monitor blood pressure daily at home. -Plan to reinitiate statin therapy postpartum if not breastfeeding. -Follow up in 4 weeks or sooner if significant leg swelling develops.  Chronic hypertension in pregnancy - blood pressure is at target today.  HLN- she prefers to hold statin. Counseled on ASCVD risk reduction with diet while not on statin she is high ris ASCVD patient   Tobacco use - cessation advised.    Patient Instructions  Medication Instructions:  Your physician recommends that you continue on your current medications as directed. Please refer to the Current  Medication list given to you today.  *If you need a refill on your cardiac medications before your next appointment, please call your pharmacy*  Follow-Up: At [facility], you and your health needs are our priority.  As part of our continuing mission to provide you with exceptional heart care, we have created designated Provider Care Teams.  These Care Teams include your primary Cardiologist (physician) and Advanced Practice Providers (APPs -  Physician Assistants and Nurse Practitioners) who all work together to provide you with the care you need, when you need it.   Your next appointment:   4 week(s) - overbook okay   Provider:   Doree Kuehne, DO     Other Instructions:     Dispo:  No follow-ups on file.   Medication Adjustments/Labs and Tests Ordered: Current medicines are reviewed at length with the patient today.  Concerns regarding medicines are outlined above.  Tests Ordered: No orders of the defined types were placed in this encounter.  Medication Changes: No orders of the defined types were placed in this encounter.

## 2023-12-17 ENCOUNTER — Other Ambulatory Visit: Payer: Self-pay | Admitting: Obstetrics & Gynecology

## 2023-12-17 DIAGNOSIS — O24410 Gestational diabetes mellitus in pregnancy, diet controlled: Secondary | ICD-10-CM

## 2023-12-17 DIAGNOSIS — O10919 Unspecified pre-existing hypertension complicating pregnancy, unspecified trimester: Secondary | ICD-10-CM

## 2023-12-17 DIAGNOSIS — O09523 Supervision of elderly multigravida, third trimester: Secondary | ICD-10-CM

## 2023-12-17 DIAGNOSIS — O0992 Supervision of high risk pregnancy, unspecified, second trimester: Secondary | ICD-10-CM

## 2023-12-19 ENCOUNTER — Encounter: Payer: Self-pay | Admitting: Obstetrics & Gynecology

## 2023-12-19 ENCOUNTER — Ambulatory Visit (INDEPENDENT_AMBULATORY_CARE_PROVIDER_SITE_OTHER): Payer: Medicaid Other | Admitting: Obstetrics & Gynecology

## 2023-12-19 ENCOUNTER — Ambulatory Visit: Payer: Medicaid Other | Admitting: Radiology

## 2023-12-19 VITALS — BP 135/89 | HR 80 | Wt 231.0 lb

## 2023-12-19 DIAGNOSIS — O0993 Supervision of high risk pregnancy, unspecified, third trimester: Secondary | ICD-10-CM | POA: Diagnosis not present

## 2023-12-19 DIAGNOSIS — O09523 Supervision of elderly multigravida, third trimester: Secondary | ICD-10-CM

## 2023-12-19 DIAGNOSIS — I252 Old myocardial infarction: Secondary | ICD-10-CM

## 2023-12-19 DIAGNOSIS — O10919 Unspecified pre-existing hypertension complicating pregnancy, unspecified trimester: Secondary | ICD-10-CM

## 2023-12-19 DIAGNOSIS — O24410 Gestational diabetes mellitus in pregnancy, diet controlled: Secondary | ICD-10-CM

## 2023-12-19 DIAGNOSIS — O10913 Unspecified pre-existing hypertension complicating pregnancy, third trimester: Secondary | ICD-10-CM

## 2023-12-19 DIAGNOSIS — Z3A31 31 weeks gestation of pregnancy: Secondary | ICD-10-CM

## 2023-12-19 DIAGNOSIS — O24419 Gestational diabetes mellitus in pregnancy, unspecified control: Secondary | ICD-10-CM

## 2023-12-19 DIAGNOSIS — I10 Essential (primary) hypertension: Secondary | ICD-10-CM | POA: Diagnosis not present

## 2023-12-19 DIAGNOSIS — O0992 Supervision of high risk pregnancy, unspecified, second trimester: Secondary | ICD-10-CM

## 2023-12-19 MED ORDER — LABETALOL HCL 200 MG PO TABS: 200.0000 mg | ORAL_TABLET | Freq: Two times a day (BID) | ORAL | 3 refills | Status: DC

## 2023-12-19 NOTE — Progress Notes (Signed)
[place]:  GA = 31 weeks Single active female fetus,  cephalic,  FHR = 045 bpm AFI = 12.4 cm,  28%,  MVP = 5.7 cm   BPP = 8/8  RI: 0.60, 0.56   33% Nl ov's   cervix appears closed

## 2023-12-19 NOTE — Progress Notes (Signed)
HIGH-RISK PREGNANCY VISIT Patient name: Kaitlin Torres MRN [id number]  Date of birth: 30-Nov-1980 Chief Complaint:   Routine Prenatal Visit  History of Present Illness:   Kaitlin Torres is a 43 y.o. G72P3003 female at [redacted]w[redacted]d with an Estimated Date of Delivery: 02/20/24 being seen today for ongoing management of a high-risk pregnancy complicated by Smitty Pluck 200 BID.    Today she reports no complaints. Contractions: Not present. Vag. Bleeding: None.  Movement: Present. denies leaking of fluid.      08/22/2023    2:45 PM 07/06/2023    3:48 PM 12/22/2022    3:55 PM 11/01/2022    9:36 AM 05/17/2022   11:49 AM  Depression screen PHQ 2/9  Decreased Interest 0 0 0 0 0  Down, Depressed, Hopeless 0 0 0 0 0  PHQ - 2 Score 0 0 0 0 0  Altered sleeping 3 0 0 0 0  Tired, decreased energy 3 3 0 0 0  Change in appetite 0 0 0 0 0  Feeling bad or failure about yourself  0 0 0 0 0  Trouble concentrating 0 0 0 0 0  Moving slowly or fidgety/restless 0 0 0 0 0  Suicidal thoughts 0 0 0 0 0  PHQ-9 Score 6 3 0 0 0  Difficult doing work/chores   Not difficult at all Not difficult at all Not difficult at all        08/22/2023    2:45 PM 07/06/2023    3:48 PM 12/22/2022    3:56 PM 11/01/2022    9:37 AM  GAD 7 : Generalized Anxiety Score  Nervous, Anxious, on Edge 0 0 0   Control/stop worrying 0 0 0   Worry too much - different things 0 0 0   Trouble relaxing 0 0 0   Restless 0 0 1 1  Easily annoyed or irritable 0 2 0   Afraid - awful might happen 0 0 0   Total GAD 7 Score 0 2 1   Anxiety Difficulty   Not difficult at all      Review of Systems:   Pertinent items are noted in HPI Denies abnormal vaginal discharge w/ itching/odor/irritation, headaches, visual changes, shortness of breath, chest pain, abdominal pain, severe nausea/vomiting, or problems with urination or bowel movements unless otherwise stated above. Pertinent History Reviewed:  Reviewed past medical,surgical, social,  obstetrical and family history.  Reviewed problem list, medications and allergies. Physical Assessment:   Vitals:   12/19/23 1146  BP: (!) 139/93  Pulse: 75  Weight: 231 lb (104.8 kg)  Body mass index is 34.11 kg/m.           Physical Examination:   General appearance: alert, well appearing, and in no distress  Mental status: alert, oriented to person, place, and time  Skin: warm & dry   Extremities:      Cardiovascular: normal heart rate noted  Respiratory: normal respiratory effort, no distress  Abdomen: gravid, soft, non-tender  Pelvic: Cervical exam deferred         Fetal Status:     Movement: Present    Fetal Surveillance Testing today: BPP 8/8 UAD 33% normal fluid   Chaperone: N/A    No results found for this or any previous visit (from the past 24 hours).  Assessment & Plan:  High-risk pregnancy: [id number] at [redacted]w[redacted]d with an Estimated Date of Delivery: 02/20/24      ICD-10-CM   1. Supervision of high risk  pregnancy in second trimester  O09.92     2. Essential hypertension  I10 labetalol (NORMODYNE) 200 MG tablet    3. History of non-ST elevation myocardial infarction (NSTEMI)  I25.2 labetalol (NORMODYNE) 200 MG tablet    4. Gestational diabetes mellitus (GDM), antepartum, gestational diabetes method of control unspecified  O24.419         Meds:  Meds ordered this encounter  Medications   labetalol (NORMODYNE) 200 MG tablet    Sig: Take 1 tablet (200 mg total) by mouth 2 (two) times daily.    Dispense:  60 tablet    Refill:  3    Orders: No orders of the defined types were placed in this encounter.    Labs/procedures today: U/S  Treatment Plan:  tice weekly surveillance at 32 weeks    Follow-up: Return for keep scheduled.   Future Appointments  Date Time Provider Department Center  12/19/2023  1:30 PM Lazaro Arms, MD [facility]  12/26/2023  1:30 PM [facility] - [facility] IMG 2 [facility] None  12/26/2023  2:30 PM Cheral Marker, CNM [facility]   12/29/2023 11:10 AM [facility] NURSE [facility]  01/02/2024  1:30 PM [facility] IMG 2 [facility] None  01/02/2024  2:30 PM Cheral Marker, CNM [facility]  01/05/2024 10:50 AM [facility] NURSE [facility]  01/09/2024  1:30 PM [facility] IMG 2 [facility] None  01/09/2024  2:30 PM Cheral Marker, CNM [facility]  01/11/2024  2:00 PM Tobb, Kardie, DO [facility] None  01/12/2024 10:50 AM [facility] NURSE [facility]  01/16/2024  2:15 PM [facility] FT IMG 2 [facility] None  01/16/2024  3:10 PM Cheral Marker, CNM [facility]  01/19/2024 10:50 AM [facility] NURSE [facility]  01/23/2024  1:30 PM [facility] FT IMG 2 [facility] None  01/23/2024  2:50 PM Myna Hidalgo, DO [facility]  01/26/2024 10:50 AM [facility] NURSE [facility]  01/30/2024  1:30 PM [facility] FT IMG 2 [facility] None  01/30/2024  2:30 PM Cheral Marker, CNM [facility]  02/02/2024 10:50 AM [facility] NURSE [facility]  02/06/2024  1:30 PM [facility] FT IMG 2 [facility] None  02/06/2024  2:50 PM Cheral Marker, CNM [facility]  02/09/2024 10:50 AM [facility] NURSE [facility]  02/13/2024  1:30 PM [facility] FT IMG 2 [facility] None  02/13/2024  2:30 PM Cheral Marker, CNM [facility]  02/16/2024 10:50 AM [facility] NURSE [facility]    No orders of the defined types were placed in this encounter.  Lazaro Arms  Attending Physician for the Center for [facility] 12/19/2023 11:54 AM

## 2023-12-20 ENCOUNTER — Other Ambulatory Visit: Payer: Self-pay | Admitting: Obstetrics & Gynecology

## 2023-12-20 DIAGNOSIS — O10919 Unspecified pre-existing hypertension complicating pregnancy, unspecified trimester: Secondary | ICD-10-CM

## 2023-12-20 DIAGNOSIS — O09523 Supervision of elderly multigravida, third trimester: Secondary | ICD-10-CM

## 2023-12-20 DIAGNOSIS — O0992 Supervision of high risk pregnancy, unspecified, second trimester: Secondary | ICD-10-CM

## 2023-12-20 DIAGNOSIS — O24410 Gestational diabetes mellitus in pregnancy, diet controlled: Secondary | ICD-10-CM

## 2023-12-26 ENCOUNTER — Ambulatory Visit: Payer: Medicaid Other | Admitting: Women's Health

## 2023-12-26 ENCOUNTER — Encounter: Payer: Self-pay | Admitting: Women's Health

## 2023-12-26 ENCOUNTER — Ambulatory Visit: Payer: Medicaid Other | Admitting: Radiology

## 2023-12-26 VITALS — BP 141/91 | HR 77 | Wt 232.0 lb

## 2023-12-26 DIAGNOSIS — I10 Essential (primary) hypertension: Secondary | ICD-10-CM

## 2023-12-26 DIAGNOSIS — O24410 Gestational diabetes mellitus in pregnancy, diet controlled: Secondary | ICD-10-CM

## 2023-12-26 DIAGNOSIS — Z1389 Encounter for screening for other disorder: Secondary | ICD-10-CM

## 2023-12-26 DIAGNOSIS — Z23 Encounter for immunization: Secondary | ICD-10-CM

## 2023-12-26 DIAGNOSIS — Z3A32 32 weeks gestation of pregnancy: Secondary | ICD-10-CM | POA: Diagnosis not present

## 2023-12-26 DIAGNOSIS — Z331 Pregnant state, incidental: Secondary | ICD-10-CM

## 2023-12-26 DIAGNOSIS — O09523 Supervision of elderly multigravida, third trimester: Secondary | ICD-10-CM

## 2023-12-26 DIAGNOSIS — O0993 Supervision of high risk pregnancy, unspecified, third trimester: Secondary | ICD-10-CM

## 2023-12-26 DIAGNOSIS — O10913 Unspecified pre-existing hypertension complicating pregnancy, third trimester: Secondary | ICD-10-CM | POA: Diagnosis not present

## 2023-12-26 DIAGNOSIS — O24419 Gestational diabetes mellitus in pregnancy, unspecified control: Secondary | ICD-10-CM | POA: Diagnosis not present

## 2023-12-26 DIAGNOSIS — I252 Old myocardial infarction: Secondary | ICD-10-CM

## 2023-12-26 DIAGNOSIS — O10919 Unspecified pre-existing hypertension complicating pregnancy, unspecified trimester: Secondary | ICD-10-CM

## 2023-12-26 DIAGNOSIS — O0992 Supervision of high risk pregnancy, unspecified, second trimester: Secondary | ICD-10-CM

## 2023-12-26 LAB — POCT URINALYSIS DIPSTICK OB
Blood, UA: NEGATIVE
Glucose, UA: NEGATIVE
Ketones, UA: NEGATIVE
Leukocytes, UA: NEGATIVE
Nitrite, UA: NEGATIVE

## 2023-12-26 MED ORDER — FLUTICASONE PROPIONATE 50 MCG/ACT NA SUSP: 1.0000 | Freq: Every day | NASAL | 2 refills | Status: DC

## 2023-12-26 NOTE — Patient Instructions (Signed)
Kaitlin Torres, thank you for choosing our office today! We appreciate the opportunity to meet your healthcare needs. You may receive a short survey by mail, e-mail, or through [employer]. If you are happy with your care we would appreciate if you could take just a few minutes to complete the survey questions. We read all of your comments and take your feedback very seriously. Thank you again for choosing our office.  Center for [employer] Team at [facility]  [facility] at [facility] ([address] [place], [place] [address]) Entrance C, located off of [address] Free 24/7 valet parking   CLASSES: Go to [email or web address] to register for classes (childbirth, breastfeeding, waterbirth, infant CPR, daddy bootcamp, etc.)  Call the office [phone]) or go to [facility] if: You begin to have strong, frequent contractions Your water breaks.  Sometimes it is a big gush of fluid, sometimes it is just a trickle that keeps getting your panties wet or running down your legs You have vaginal bleeding.  It is normal to have a small amount of spotting if your cervix was checked.  You don't feel your baby moving like normal.  If you don't, get you something to eat and drink and lay down and focus on feeling your baby move.   If your baby is still not moving like normal, you should call the office or go to [facility].  Call the office [phone]) or go to [facility] hospital for these signs of pre-eclampsia: Severe headache that does not go away with Tylenol Visual changes- seeing spots, double, blurred vision Pain under your right breast or upper abdomen that does not go away with Tums or heartburn medicine Nausea and/or vomiting Severe swelling in your hands, feet, and face   Tdap Vaccine It is recommended that you get the Tdap vaccine during the third trimester of EACH pregnancy to help protect your baby from getting pertussis (whooping cough) 27-36 weeks is the BEST time to do this  so that you can pass the protection on to your baby. During pregnancy is better than after pregnancy, but if you are unable to get it during pregnancy it will be offered at the hospital.  You can get this vaccine with [place], at the health department, your family doctor, or some local pharmacies Everyone who will be around your baby should also be up-to-date on their vaccines before the baby comes. Adults (who are not pregnant) only need 1 dose of Tdap during adulthood.   [facility] Pediatricians/Family Doctors [facility]): [address] Colette Ribas, [phone]           [facility]: [address], [phone]                [facility]): [address], [phone] (call to ask if accepting patients) [facility] Department: [address], [place], [phone]    [facility] Pediatricians/Family Doctors [facility]): [phone], [phone] [facility]: [address] [place], [phone] [facility]: [address], [phone]  [facility] Doctors  [facility]): [phone] [facility]: [address] [phone]   [facility] Doctors [facility]: [address] [phone]  [facility] Family Doctors  [facility]: [phone], [phone]  Home Blood Pressure Monitoring for Patients   Your provider has recommended that you check your  blood pressure (BP) at least once a week at home. If you do not have a blood pressure cuff at home, one will be provided for you. Contact your provider if you have not received your monitor within 1 week.   Helpful Tips for Accurate Home Blood Pressure Checks  Don't smoke, exercise, or drink caffeine 30 minutes before checking your BP Use the restroom before checking your BP (a full bladder can raise your  pressure) Relax in a comfortable upright chair Feet on the ground Left arm resting comfortably on a flat surface at the level of your heart Legs uncrossed Back supported Sit quietly and don't talk Place the cuff on your bare arm Adjust snuggly, so that only two fingertips can fit between your skin and the top of the cuff Check 2 readings separated by at least one minute Keep a log of your BP readings For a visual, please reference this diagram: http://ccnc.care/bpdiagram  Provider Name: [facility]     Phone: [phone]  Zone 1: ALL CLEAR  Continue to monitor your symptoms:  BP reading is less than 140 (top number) or less than 90 (bottom number)  No right upper stomach pain No headaches or seeing spots No feeling nauseated or throwing up No swelling in face and hands  Zone 2: CAUTION Call your doctor's office for any of the following:  BP reading is greater than 140 (top number) or greater than 90 (bottom number)  Stomach pain under your ribs in the middle or right side Headaches or seeing spots Feeling nauseated or throwing up Swelling in face and hands  Zone 3: EMERGENCY  Seek immediate medical care if you have any of the following:  BP reading is greater than160 (top number) or greater than 110 (bottom number) Severe headaches not improving with Tylenol Serious difficulty catching your breath Any worsening symptoms from Zone 2  Preterm Labor and Birth Information  The normal length of a pregnancy is 39-41 weeks. Preterm labor is when labor starts before 37 completed weeks of pregnancy. What are the risk factors for preterm labor? Preterm labor is more likely to occur in women who: Have certain infections during pregnancy such as a bladder infection, sexually transmitted infection, or infection inside the uterus (chorioamnionitis). Have a shorter-than-normal cervix. Have gone into preterm labor before. Have had surgery on their cervix. Are younger than age 77  or older than age 59. Are African American. Are pregnant with twins or multiple babies (multiple gestation). Take street drugs or smoke while pregnant. Do not gain enough weight while pregnant. Became pregnant shortly after having been pregnant. What are the symptoms of preterm labor? Symptoms of preterm labor include: Cramps similar to those that can happen during a menstrual period. The cramps may happen with diarrhea. Pain in the abdomen or lower back. Regular uterine contractions that may feel like tightening of the abdomen. A feeling of increased pressure in the pelvis. Increased watery or bloody mucus discharge from the vagina. Water breaking (ruptured amniotic sac). Why is it important to recognize signs of preterm labor? It is important to recognize signs of preterm labor because babies who are born prematurely may not be fully developed. This can put them at an increased risk for: Long-term (chronic) heart and lung problems. Difficulty immediately after birth with regulating body systems, including blood sugar, body temperature, heart rate, and breathing rate. Bleeding in the brain. Cerebral palsy. Learning difficulties. Death. These risks are highest for babies who are born before 34 weeks  of pregnancy. How is preterm labor treated? Treatment depends on the length of your pregnancy, your condition, and the health of your baby. It may involve: Having a stitch (suture) placed in your cervix to prevent your cervix from opening too early (cerclage). Taking or being given medicines, such as: Hormone medicines. These may be given early in pregnancy to help support the pregnancy. Medicine to stop contractions. Medicines to help mature the baby's lungs. These may be prescribed if the risk of delivery is high. Medicines to prevent your baby from developing cerebral palsy. If the labor happens before 34 weeks of pregnancy, you may need to stay in the hospital. What should I do if I  think I am in preterm labor? If you think that you are going into preterm labor, call your health care provider right away. How can I prevent preterm labor in future pregnancies? To increase your chance of having a full-term pregnancy: Do not use any tobacco products, such as cigarettes, chewing tobacco, and e-cigarettes. If you need help quitting, ask your health care provider. Do not use street drugs or medicines that have not been prescribed to you during your pregnancy. Talk with your health care provider before taking any herbal supplements, even if you have been taking them regularly. Make sure you gain a healthy amount of weight during your pregnancy. Watch for infection. If you think that you might have an infection, get it checked right away. Make sure to tell your health care provider if you have gone into preterm labor before. This information is not intended to replace advice given to you by your health care provider. Make sure you discuss any questions you have with your health care provider. Document Revised: 02/15/2019 Document Reviewed: 03/16/2016 Elsevier Patient Education  2020 [employer].

## 2023-12-26 NOTE — Progress Notes (Signed)
HIGH-RISK PREGNANCY VISIT Patient name: Kaitlin Torres MRN [id number]  Date of birth: [birth year] Chief Complaint:   Routine Prenatal Visit and Pregnancy Ultrasound (Patient BP cuff reading 141/91 pulse 77)  History of Present Illness:   Kaitlin Torres is a 43 y.o. G60P3003 female at [redacted]w[redacted]d with an Estimated Date of Delivery: 02/20/24 being seen today for ongoing management of a high-risk pregnancy complicated by advanced maternal age >=40yo, chronic hypertension currently on labetalol 200mg  BID, diabetes mellitus A1DM, and h/o MI.    Today she reports  not checking sugars 'too much going on', reports when she was checking them they were mostly normal. Doesn't eat a lot of sweets. Did meet w/ dietician. Took labetalol @ 1200 today. Hasn't been checking home bp's lately.  . Denies ha, visual changes, ruq/epigastric pain, n/v.  Nasal congestion, requests rx for flonase. Contractions: Not present.  .  Movement: Present. denies leaking of fluid.      08/22/2023    2:45 PM 07/06/2023    3:48 PM 12/22/2022    3:55 PM 11/01/2022    9:36 AM 05/17/2022   11:49 AM  Depression screen PHQ 2/9  Decreased Interest 0 0 0 0 0  Down, Depressed, Hopeless 0 0 0 0 0  PHQ - 2 Score 0 0 0 0 0  Altered sleeping 3 0 0 0 0  Tired, decreased energy 3 3 0 0 0  Change in appetite 0 0 0 0 0  Feeling bad or failure about yourself  0 0 0 0 0  Trouble concentrating 0 0 0 0 0  Moving slowly or fidgety/restless 0 0 0 0 0  Suicidal thoughts 0 0 0 0 0  PHQ-9 Score 6 3 0 0 0  Difficult doing work/chores   Not difficult at all Not difficult at all Not difficult at all        08/22/2023    2:45 PM 07/06/2023    3:48 PM 12/22/2022    3:56 PM 11/01/2022    9:37 AM  GAD 7 : Generalized Anxiety Score  Nervous, Anxious, on Edge 0 0 0   Control/stop worrying 0 0 0   Worry too much - different things 0 0 0   Trouble relaxing 0 0 0   Restless 0 0 1 1  Easily annoyed or irritable 0 2 0   Afraid - awful might happen 0 0 0    Total GAD 7 Score 0 2 1   Anxiety Difficulty   Not difficult at all      Review of Systems:   Pertinent items are noted in HPI Denies abnormal vaginal discharge w/ itching/odor/irritation, headaches, visual changes, shortness of breath, chest pain, abdominal pain, severe nausea/vomiting, or problems with urination or bowel movements unless otherwise stated above. Pertinent History Reviewed:  Reviewed past medical,surgical, social, obstetrical and family history.  Reviewed problem list, medications and allergies. Physical Assessment:   Vitals:   12/26/23 1408 12/26/23 1448  BP: (!) 144/89 (!) 141/91  Pulse: 77 77  Weight: 232 lb (105.2 kg)   Body mass index is 34.26 kg/m.           Physical Examination:   General appearance: alert, well appearing, and in no distress  Mental status: alert, oriented to person, place, and time  Skin: warm & dry   Extremities: Edema: None    Cardiovascular: normal heart rate noted  Respiratory: normal respiratory effort, no distress  Abdomen: gravid, soft, non-tender  Pelvic: Cervical exam deferred  Fetal Status:     Movement: Present    Fetal Surveillance Testing today:  [place]:  GA [redacted] weeks Single active female fetus, cephalic, FHR = 151 bpm AFI = 16.1 cm,  48%  MVP = 3.7 cm BPP = 8/8,   RI =0.66,  0.74    66%    Chaperone: N/A    Results for orders placed or performed in visit on 12/26/23 (from the past 24 hours)  POC Urinalysis Dipstick OB   Collection Time: 12/26/23  2:42 PM  Result Value Ref Range   Color, UA     Clarity, UA     Glucose, UA Negative Negative   Bilirubin, UA     Ketones, UA negative    Spec Grav, UA     Blood, UA negative    pH, UA     POC,PROTEIN,UA Trace Negative, Trace, Small (1+), Moderate (2+), Large (3+), 4+   Urobilinogen, UA     Nitrite, UA negative    Leukocytes, UA Negative Negative   Appearance     Odor      Assessment & Plan:  High-risk pregnancy: [id number] at [redacted]w[redacted]d with an Estimated Date of  Delivery: 02/20/24   1) CHTN, bpp 8/8 w/ normal UAD. Continue ASA.  labetalol 200mg  BID (too at 1200), asymptomatic, tr proteinuria. Discussed w/ Dr. Despina Hidden, no change in meds for now. Reviewed pre-e s/s, reasons to seek care  2) A1DM, not checking sugars. Discussed importance of strict glycemic control and adherence to low carb diet during pregnancy as well as potential complications from uncontrolled diabetes during pregnancy and not knowing control, including fetal death. 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    Dispense:  9.9 mL    Refill:  2    Labs/procedures today: Tdap and U/S  Treatment Plan:  2x/wk testing dopp/bpp and NST, EFW q4w, deliver 37-39  Reviewed: Preterm labor symptoms and general obstetric precautions including but not limited to vaginal bleeding, contractions, leaking of fluid and fetal movement were reviewed in detail with the patient.  All questions were answered. Does have home bp cuff. Office bp cuff given: not applicable. Check bp daily, let us know if consistently >140 and/or >90.  Follow-up: Return for change visits to MD only.   Future Appointments  Date Time Provider Department Center  12/29/2023 11:10 AM [facility] NURSE [facility]  01/02/2024  1:30 PM [facility] - FT IMG 2 [facility] None  01/02/2024  2:30 PM Cheral Marker, CNM [facility]  01/05/2024 10:50 AM [facility] NURSE [facility]  01/09/2024  1:30 PM [facility] - FT IMG 2 [facility] None  01/09/2024  2:30 PM Cheral Marker, CNM [facility]  01/11/2024  2:00 PM Tobb, Kardie, DO [facility]  None  01/12/2024 10:50 AM [facility] NURSE [facility]  01/16/2024  2:15 PM [facility] - FT IMG 2 [facility] None  01/16/2024  3:10 PM Cheral Marker, CNM [facility]  01/19/2024 10:50 AM [facility] NURSE [facility]  01/23/2024  1:30 PM [facility] - FT IMG 2  [facility] None  01/23/2024  2:50 PM Myna Hidalgo, DO [facility]  01/26/2024 10:50 AM [facility] NURSE [facility]  01/30/2024  1:30 PM [facility] - FT IMG 2 [facility] None  01/30/2024  2:30 PM Cheral Marker, CNM [facility]  02/02/2024 10:50 AM [facility] NURSE [facility]  02/06/2024  1:30 PM [facility] - FT IMG 2 [facility] None  02/06/2024  2:50 PM Cheral Marker, CNM [facility]  02/09/2024 10:50 AM [facility] NURSE [facility]  02/13/2024  1:30 PM [facility] - FT IMG 2 [facility] None  02/13/2024  2:30 PM Cheral Marker, CNM [facility]  02/16/2024 10:50 AM [facility] NURSE [facility]    Orders Placed This Encounter  Procedures   POC Urinalysis Dipstick OB   Cheral Marker CNM, [facility] 12/26/2023 3:06 PM

## 2023-12-26 NOTE — Progress Notes (Signed)
[place]:  GA [redacted] weeks Single active female fetus, cephalic, FHR = 151 bpm AFI = 16.1 cm,  48%  MVP = 3.7 cm BPP = 8/8,   RI =0.66,  0.74    66%

## 2023-12-29 ENCOUNTER — Ambulatory Visit: Payer: Medicaid Other | Admitting: *Deleted

## 2023-12-29 VITALS — BP 127/84 | HR 74 | Wt 235.0 lb

## 2023-12-29 DIAGNOSIS — Z331 Pregnant state, incidental: Secondary | ICD-10-CM | POA: Diagnosis not present

## 2023-12-29 DIAGNOSIS — O0993 Supervision of high risk pregnancy, unspecified, third trimester: Secondary | ICD-10-CM

## 2023-12-29 DIAGNOSIS — O24419 Gestational diabetes mellitus in pregnancy, unspecified control: Secondary | ICD-10-CM

## 2023-12-29 DIAGNOSIS — Z1389 Encounter for screening for other disorder: Secondary | ICD-10-CM | POA: Diagnosis not present

## 2023-12-29 DIAGNOSIS — O10913 Unspecified pre-existing hypertension complicating pregnancy, third trimester: Secondary | ICD-10-CM | POA: Diagnosis not present

## 2023-12-29 DIAGNOSIS — Z3A32 32 weeks gestation of pregnancy: Secondary | ICD-10-CM | POA: Diagnosis not present

## 2023-12-29 DIAGNOSIS — O10919 Unspecified pre-existing hypertension complicating pregnancy, unspecified trimester: Secondary | ICD-10-CM

## 2023-12-29 LAB — POCT URINALYSIS DIPSTICK OB
Blood, UA: NEGATIVE
Glucose, UA: NEGATIVE
Ketones, UA: NEGATIVE
Leukocytes, UA: NEGATIVE
Nitrite, UA: NEGATIVE

## 2023-12-29 NOTE — Addendum Note (Signed)
Addended by: Federico Flake A on: 12/29/2023 12:00 PM   Modules accepted: Orders

## 2023-12-29 NOTE — Progress Notes (Signed)
   NURSE VISIT- NST  SUBJECTIVE:  Kaitlin Torres is a 43 y.o. G4P3003 female at [redacted]w[redacted]d, here for a NST for pregnancy complicated by [facility] and Diabetes: A1DM}.  She reports active fetal movement, contractions: none, vaginal bleeding: none, membranes: intact.   OBJECTIVE:  BP (!) 145/94   Pulse 77   Wt 235 lb (106.6 kg)   LMP 05/16/2023   BMI 34.70 kg/m   Appears well, no apparent distress  No results found for this or any previous visit (from the past 24 hours).  NST: FHR baseline 140 bpm, Variability: moderate, Accelerations:present, Decelerations:  Absent= Cat 1/reactive Toco: none   ASSESSMENT: [id number] at [redacted]w[redacted]d with CHTN and Diabetes: A1DM} NST reactive  PLAN: EFM strip reviewed by Dr. Despina Hidden   Recommendations: keep next appointment as scheduled    Jobe Marker  12/29/2023 11:52 AM

## 2024-01-01 ENCOUNTER — Other Ambulatory Visit: Payer: Self-pay | Admitting: Obstetrics & Gynecology

## 2024-01-01 DIAGNOSIS — O09523 Supervision of elderly multigravida, third trimester: Secondary | ICD-10-CM

## 2024-01-01 DIAGNOSIS — O10919 Unspecified pre-existing hypertension complicating pregnancy, unspecified trimester: Secondary | ICD-10-CM

## 2024-01-01 DIAGNOSIS — O24410 Gestational diabetes mellitus in pregnancy, diet controlled: Secondary | ICD-10-CM

## 2024-01-01 DIAGNOSIS — O0992 Supervision of high risk pregnancy, unspecified, second trimester: Secondary | ICD-10-CM

## 2024-01-02 ENCOUNTER — Encounter: Payer: Medicaid Other | Admitting: Women's Health

## 2024-01-02 ENCOUNTER — Ambulatory Visit: Payer: Medicaid Other | Admitting: Obstetrics & Gynecology

## 2024-01-02 ENCOUNTER — Encounter: Payer: Self-pay | Admitting: Obstetrics & Gynecology

## 2024-01-02 ENCOUNTER — Ambulatory Visit (INDEPENDENT_AMBULATORY_CARE_PROVIDER_SITE_OTHER): Payer: Medicaid Other | Admitting: Radiology

## 2024-01-02 VITALS — BP 137/84 | HR 94 | Wt 238.0 lb

## 2024-01-02 DIAGNOSIS — O10919 Unspecified pre-existing hypertension complicating pregnancy, unspecified trimester: Secondary | ICD-10-CM

## 2024-01-02 DIAGNOSIS — O10913 Unspecified pre-existing hypertension complicating pregnancy, third trimester: Secondary | ICD-10-CM

## 2024-01-02 DIAGNOSIS — O24410 Gestational diabetes mellitus in pregnancy, diet controlled: Secondary | ICD-10-CM | POA: Diagnosis not present

## 2024-01-02 DIAGNOSIS — O09523 Supervision of elderly multigravida, third trimester: Secondary | ICD-10-CM

## 2024-01-02 DIAGNOSIS — O24419 Gestational diabetes mellitus in pregnancy, unspecified control: Secondary | ICD-10-CM | POA: Diagnosis not present

## 2024-01-02 DIAGNOSIS — O0993 Supervision of high risk pregnancy, unspecified, third trimester: Secondary | ICD-10-CM

## 2024-01-02 DIAGNOSIS — Z3A33 33 weeks gestation of pregnancy: Secondary | ICD-10-CM

## 2024-01-02 DIAGNOSIS — O0992 Supervision of high risk pregnancy, unspecified, second trimester: Secondary | ICD-10-CM

## 2024-01-02 NOTE — Progress Notes (Signed)
 [place]:  GA = 33 weeks Single active female fetus, cephalic, FHR = 138 bpm  AFI = 18.2 cm  74%  MVP = 5.1 cm Anterior placenta gr2,  BPP = 8/8,  RI: 0.63, 0.64 56%

## 2024-01-02 NOTE — Progress Notes (Signed)
 HIGH-RISK PREGNANCY VISIT Patient name: Kaitlin Torres MRN [id number]  Date of birth: 1981-03-28 Chief Complaint:   Routine Prenatal Visit  History of Present Illness:   Kaitlin Torres is a 43 y.o. G49P3003 female at [redacted]w[redacted]d with an Estimated Date of Delivery: 02/20/24 being seen today for ongoing management of a high-risk pregnancy complicated by A1DM and cHTN on labetalol 200 BID.    Today she reports no complaints. Contractions: Not present. Vag. Bleeding: None.  Movement: Present. denies leaking of fluid.      08/22/2023    2:45 PM 07/06/2023    3:48 PM 12/22/2022    3:55 PM 11/01/2022    9:36 AM 05/17/2022   11:49 AM  Depression screen PHQ 2/9  Decreased Interest 0 0 0 0 0  Down, Depressed, Hopeless 0 0 0 0 0  PHQ - 2 Score 0 0 0 0 0  Altered sleeping 3 0 0 0 0  Tired, decreased energy 3 3 0 0 0  Change in appetite 0 0 0 0 0  Feeling bad or failure about yourself  0 0 0 0 0  Trouble concentrating 0 0 0 0 0  Moving slowly or fidgety/restless 0 0 0 0 0  Suicidal thoughts 0 0 0 0 0  PHQ-9 Score 6 3 0 0 0  Difficult doing work/chores   Not difficult at all Not difficult at all Not difficult at all        08/22/2023    2:45 PM 07/06/2023    3:48 PM 12/22/2022    3:56 PM 11/01/2022    9:37 AM  GAD 7 : Generalized Anxiety Score  Nervous, Anxious, on Edge 0 0 0   Control/stop worrying 0 0 0   Worry too much - different things 0 0 0   Trouble relaxing 0 0 0   Restless 0 0 1 1  Easily annoyed or irritable 0 2 0   Afraid - awful might happen 0 0 0   Total GAD 7 Score 0 2 1   Anxiety Difficulty   Not difficult at all      Review of Systems:   Pertinent items are noted in HPI Denies abnormal vaginal discharge w/ itching/odor/irritation, headaches, visual changes, shortness of breath, chest pain, abdominal pain, severe nausea/vomiting, or problems with urination or bowel movements unless otherwise stated above. Pertinent History Reviewed:  Reviewed past medical,surgical,  social, obstetrical and family history.  Reviewed problem list, medications and allergies. Physical Assessment:   Vitals:   01/02/24 1344  BP: 137/84  Pulse: 94  Weight: 238 lb (108 kg)  Body mass index is 35.15 kg/m.           Physical Examination:   General appearance: alert, well appearing, and in no distress  Mental status: alert, oriented to person, place, and time  Skin: warm & dry   Extremities:      Cardiovascular: normal heart rate noted  Respiratory: normal respiratory effort, no distress  Abdomen: gravid, soft, non-tender  Pelvic: Cervical exam deferred         Fetal Status:     Movement: Present    Fetal Surveillance Testing today: BPP 8/8   Chaperone: N/A    No results found for this or any previous visit (from the past 24 hours).  Assessment & Plan:  High-risk pregnancy: [id number] at [redacted]w[redacted]d with an Estimated Date of Delivery: 02/20/24      ICD-10-CM   1. Supervision of high risk pregnancy in  third trimester  O09.93     2. Gestational diabetes mellitus (GDM), antepartum, gestational diabetes method of control unspecified  O24.419     3. Chronic hypertension affecting pregnancy  O10.919          Meds: No orders of the defined types were placed in this encounter.   Orders: No orders of the defined types were placed in this encounter.    Labs/procedures today: U/S  Treatment Plan:  twice weekly surveillance  Reviewed: Preterm labor symptoms and general obstetric precautions including but not limited to vaginal bleeding, contractions, leaking of fluid and fetal movement were reviewed in detail with the patient.  All questions were answered. Does have home bp cuff. Office bp cuff given: not applicable. Check bp daily, let us know if consistently >150 and/or >95.  Follow-up: Return for keep scheduled.   Future Appointments  Date Time Provider Department Center  01/02/2024  2:50 PM Lazaro Arms, MD [facility]  01/05/2024 10:50 AM [facility] NURSE  [facility]  01/09/2024  1:30 PM [facility] IMG 2 [facility] None  01/11/2024  2:00 PM Tobb, Kardie, DO [facility] None  01/12/2024 10:50 AM [facility] NURSE [facility]  01/12/2024 10:50 AM Myna Hidalgo, DO [facility]  01/16/2024  2:15 PM [facility] IMG 2 [facility] None  01/16/2024  3:10 PM Lazaro Arms, MD [facility]  01/19/2024 10:50 AM [facility] NURSE [facility]  01/23/2024  1:30 PM [facility] IMG 2 [facility] None  01/23/2024  2:50 PM Myna Hidalgo, DO [facility]  01/26/2024 10:50 AM [facility] NURSE [facility]  01/30/2024  1:30 PM [facility] IMG 2 [facility] None  01/30/2024  2:50 PM Myna Hidalgo, DO [facility]  02/02/2024 10:50 AM [facility] NURSE [facility]  02/06/2024  1:30 PM [facility] IMG 2 [facility] None  02/06/2024  2:30 PM Lazaro Arms, MD [facility]  02/09/2024 10:50 AM [facility] NURSE [facility]  02/13/2024  1:30 PM [facility] IMG 2 [facility] None  02/13/2024  2:30 PM Lazaro Arms, MD [facility]  02/16/2024 10:50 AM [facility] NURSE [facility]    No orders of the defined types were placed in this encounter.  Lazaro Arms  Attending Physician for the Center for [facility] 01/02/2024 2:05 PM

## 2024-01-05 ENCOUNTER — Ambulatory Visit: Payer: Medicaid Other | Admitting: *Deleted

## 2024-01-05 ENCOUNTER — Other Ambulatory Visit: Payer: Self-pay | Admitting: Obstetrics & Gynecology

## 2024-01-05 DIAGNOSIS — O10913 Unspecified pre-existing hypertension complicating pregnancy, third trimester: Secondary | ICD-10-CM | POA: Diagnosis not present

## 2024-01-05 DIAGNOSIS — Z3A33 33 weeks gestation of pregnancy: Secondary | ICD-10-CM | POA: Diagnosis not present

## 2024-01-05 DIAGNOSIS — O0993 Supervision of high risk pregnancy, unspecified, third trimester: Secondary | ICD-10-CM

## 2024-01-05 DIAGNOSIS — I10 Essential (primary) hypertension: Secondary | ICD-10-CM

## 2024-01-05 DIAGNOSIS — O10919 Unspecified pre-existing hypertension complicating pregnancy, unspecified trimester: Secondary | ICD-10-CM

## 2024-01-05 DIAGNOSIS — I252 Old myocardial infarction: Secondary | ICD-10-CM

## 2024-01-05 LAB — POCT URINALYSIS DIPSTICK OB
Blood, UA: NEGATIVE
Glucose, UA: NEGATIVE
Ketones, UA: NEGATIVE
Leukocytes, UA: NEGATIVE
Nitrite, UA: NEGATIVE
POC,PROTEIN,UA: NEGATIVE

## 2024-01-05 MED ORDER — LABETALOL HCL 200 MG PO TABS: 400.0000 mg | ORAL_TABLET | Freq: Three times a day (TID) | ORAL | 3 refills | Status: DC

## 2024-01-05 NOTE — Progress Notes (Signed)
   NURSE VISIT- NST  SUBJECTIVE:  Kaitlin Torres is a 43 y.o. G20P3003 female at [redacted]w[redacted]d, here for a NST for pregnancy complicated by [facility] and Diabetes: A1DM}.  She reports active fetal movement, contractions: none, vaginal bleeding: none, membranes: intact. Not feeling well today.  Headache, swelling.  OBJECTIVE:  LMP 05/16/2023   Appears well, no apparent distress  Results for orders placed or performed in visit on 01/05/24 (from the past 24 hours)  POC Urinalysis Dipstick OB   Collection Time: 01/05/24 11:37 AM  Result Value Ref Range   Color, UA     Clarity, UA     Glucose, UA Negative Negative   Bilirubin, UA     Ketones, UA neg    Spec Grav, UA     Blood, UA neg    pH, UA     POC,PROTEIN,UA Negative Negative, Trace, Small (1+), Moderate (2+), Large (3+), 4+   Urobilinogen, UA     Nitrite, UA neg    Leukocytes, UA Negative Negative   Appearance     Odor      NST: FHR baseline 135 bpm, Variability: moderate, Accelerations:present, Decelerations:  Absent= Cat 1/reactive Toco: none   ASSESSMENT: [id number] at [redacted]w[redacted]d with CHTN and Diabetes: A1DM} NST reactive  PLAN: EFM strip reviewed by Dr. Despina Hidden   Recommendations:  Increase Labetalol to 400 TID, will send in new script     Jobe Marker  01/05/2024 11:37 AM

## 2024-01-06 ENCOUNTER — Inpatient Hospital Stay (HOSPITAL_COMMUNITY)
Admission: AD | Admit: 2024-01-06 | Discharge: 2024-01-06 | Disposition: A | Discharge status: Home / Self Care | Attending: Obstetrics & Gynecology | Admitting: Obstetrics & Gynecology

## 2024-01-06 ENCOUNTER — Encounter (HOSPITAL_COMMUNITY): Payer: Self-pay | Admitting: Obstetrics & Gynecology

## 2024-01-06 DIAGNOSIS — R519 Headache, unspecified: Secondary | ICD-10-CM | POA: Insufficient documentation

## 2024-01-06 DIAGNOSIS — O26893 Other specified pregnancy related conditions, third trimester: Secondary | ICD-10-CM | POA: Diagnosis not present

## 2024-01-06 DIAGNOSIS — O10013 Pre-existing essential hypertension complicating pregnancy, third trimester: Secondary | ICD-10-CM | POA: Diagnosis not present

## 2024-01-06 DIAGNOSIS — Z3A33 33 weeks gestation of pregnancy: Secondary | ICD-10-CM | POA: Diagnosis not present

## 2024-01-06 DIAGNOSIS — O0993 Supervision of high risk pregnancy, unspecified, third trimester: Secondary | ICD-10-CM | POA: Insufficient documentation

## 2024-01-06 DIAGNOSIS — O10913 Unspecified pre-existing hypertension complicating pregnancy, third trimester: Secondary | ICD-10-CM

## 2024-01-06 LAB — CBC
HCT: 38.7 % (ref 36.0–46.0)
Hemoglobin: 12.7 g/dL (ref 12.0–15.0)
MCH: 24.2 pg — ABNORMAL LOW (ref 26.0–34.0)
MCHC: 32.8 g/dL (ref 30.0–36.0)
MCV: 73.7 fL — ABNORMAL LOW (ref 80.0–100.0)
Platelets: 218 10*3/uL (ref 150–400)
RBC: 5.25 MIL/uL — ABNORMAL HIGH (ref 3.87–5.11)
RDW: 20.3 % — ABNORMAL HIGH (ref 11.5–15.5)
WBC: 11 10*3/uL — ABNORMAL HIGH (ref 4.0–10.5)
nRBC: 0 % (ref 0.0–0.2)

## 2024-01-06 LAB — COMPREHENSIVE METABOLIC PANEL
ALT: 31 U/L (ref 0–44)
AST: 31 U/L (ref 15–41)
Albumin: 2.6 g/dL — ABNORMAL LOW (ref 3.5–5.0)
Alkaline Phosphatase: 198 U/L — ABNORMAL HIGH (ref 38–126)
Anion gap: 9 (ref 5–15)
BUN: 9 mg/dL (ref 6–20)
CO2: 17 mmol/L — ABNORMAL LOW (ref 22–32)
Calcium: 9 mg/dL (ref 8.9–10.3)
Chloride: 109 mmol/L (ref 98–111)
Creatinine, Ser: 0.54 mg/dL (ref 0.44–1.00)
GFR, Estimated: >60 mL/min (ref >60)
Glucose, Bld: 108 mg/dL — ABNORMAL HIGH (ref 70–99)
Potassium: 3.5 mmol/L (ref 3.5–5.1)
Sodium: 135 mmol/L (ref 135–145)
Total Bilirubin: 0.5 mg/dL (ref 0.0–1.2)
Total Protein: 5.7 g/dL — ABNORMAL LOW (ref 6.5–8.1)

## 2024-01-06 MED ORDER — LABETALOL HCL 5 MG/ML IV SOLN: 40.0000 mg | INTRAVENOUS | Status: DC | PRN

## 2024-01-06 MED ORDER — NIFEDIPINE 10 MG PO CAPS: 10.0000 mg | ORAL_CAPSULE | ORAL | Status: DC | PRN

## 2024-01-06 MED ORDER — FLUTICASONE PROPIONATE 50 MCG/ACT NA SUSP
2.0000 | Freq: Every day | NASAL | Status: DC
  Administered 2024-01-06: 2 via NASAL
  Filled 2024-01-06: qty 16

## 2024-01-06 MED ORDER — NIFEDIPINE 10 MG PO CAPS: 20.0000 mg | ORAL_CAPSULE | ORAL | Status: DC | PRN

## 2024-01-06 NOTE — MAU Note (Signed)
..  Kaitlin Torres is a 43 y.o. at [redacted]w[redacted]d here in MAU reporting: woke up around 1300 and noticed her head was hurting so she checked her BP and it was 168/111. She took her labetalol and it came down to 143/101. States she also had some blurry vision earlier and has lower extremity edema. Denies VB or LOF. +FM.   Pain score: 0 Vitals:   01/06/24 1700  BP: 139/84  Pulse: 75  Resp: 14  Temp: 97.6 F (36.4 C)  SpO2: 100%     FHT:139 Lab orders placed from triage:   UA

## 2024-01-06 NOTE — MAU Provider Note (Signed)
 Chief Complaint:  Headache and Hypertension   HPI    Kaitlin Torres is a 43 y.o. [phone] at [redacted]w[redacted]d who presents to maternity admissions reporting at 1 PM today she woke up with a severe HA and immediately took her BP which she reported as 168/111. She said she took her labetalol 400 mg and after 1 hour repeated her BP and it was 143/101 and then she reported that they stopped for [place] on the way to the hospital and now her HA is gone after eating. She reports that she did not take anything for her HA other than her BP medication but reports a history of HA's. Denies any RUQ pain, reports that she has mild visual changes but denies any scotoma or floaters. Denies photophobia or phonophobia and denies a h/o aura with prior HA's. She also reports nasal congestion and usually takes Flonase but forgot this AM - offered dose here and she accepted. HA resolved so no meds required at this time. BP's have been normotensive thus far will continue observation  Her Pregnancy is complicated by cHTN on labetalol 400 mg TID, AMA, and A1GDM. Her CBG this afternoon she reported was 80's which is low for her. Denies any OB complaints. 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   Cough    Generalized headaches    Heart attack (HCC) 04/2019   Hyperlipidemia    Hypertension    PCOS (polycystic ovarian syndrome)    Rectal pain    Vitamin D deficiency 01/15/2021   OB History  Gravida Para Term Preterm AB Living  4 3 3   3   SAB IAB Ectopic Multiple Live Births      3    # Outcome Date GA Lbr Len/2nd Weight Sex Type Anes PTL Lv  4 Current           3 Term 2010   2722 g F Vag-Spont   LIV  2 Term 2004   2722 g F Vag-Spont   LIV  1 Term 2002   2722 g F Vag-Spont  N LIV   Past Surgical History:  Procedure Laterality Date   ANKLE SURGERY  05/2004   screws placed in right ankle    BREAST DUCTAL SYSTEM  EXCISION Right 03/17/2021   Ductal papilloma, hyalinized.  Fibrocystic change.  Usual duct  epithelial hyperplasia.   CORONARY STENT INTERVENTION N/A 05/08/2019   Procedure: CORONARY STENT INTERVENTION;  Surgeon: Swaziland, Peter M, MD;  Location: [facility] INVASIVE CV LAB;  Service: Cardiovascular;  Laterality: N/A;   FRACTURE SURGERY  2005   right ankle   HEMORRHOID SURGERY     HEMORRHOID SURGERY  2015   INCISION AND DRAINAGE BREAST ABSCESS  09/2010   left breast    LEFT HEART CATH AND CORONARY ANGIOGRAPHY N/A 05/08/2019   Procedure: LEFT HEART CATH AND CORONARY ANGIOGRAPHY;  Surgeon: Swaziland, Peter M, MD;  Location: [facility] INVASIVE CV LAB;  Service: Cardiovascular;  Laterality: N/A;   Family History  Problem Relation Age of Onset   Hypertension Mother    Peripheral Artery Disease Mother    Heart disease Father        Cardiac arrest 2006   Diabetes Father    Hyperlipidemia Father    Stroke Father    Aneurysm Brother    Cervical cancer Maternal Grandmother    Diabetes Paternal Grandmother  Social History   Tobacco Use   Smoking status: Former    Current packs/day: 0.00    Average packs/day: 0.1 packs/day for 26.0 years (2.6 ttl pk-yrs)    Types: Cigarettes    Start date: 09/15/1993    Quit date: 09/16/2019    Years since quitting: 4.3   Smokeless tobacco: Never  Vaping Use   Vaping status: Every Day   Substances: Nicotine  Substance Use Topics   Alcohol use: No    Comment: quit 2018   Drug use: No   Allergies  Allergen Reactions   Diclofenac Hives   No medications prior to admission.    I have reviewed patient's Past Medical Hx, Surgical Hx, Family Hx, Social Hx, medications and allergies.   ROS  Pertinent items noted in HPI and remainder of comprehensive ROS otherwise negative.   PHYSICAL EXAM  Patient Vitals for the past 24 hrs:  BP Temp Temp src Pulse Resp SpO2 Height Weight  01/06/24 1815 135/82 -- -- 68 16 97 % -- --  01/06/24 1800 (!) 148/89 -- -- 70 16 99 % -- --   01/06/24 1745 138/85 -- -- 73 16 100 % -- --  01/06/24 1730 132/82 -- -- 74 16 99 % -- --  01/06/24 1722 135/78 -- -- 74 -- -- -- --  01/06/24 1715 -- -- -- -- -- 99 % -- --  01/06/24 1700 139/84 97.6 F (36.4 C) Oral 75 14 100 % 5\' 9"  (1.753 m) 109.6 kg    Constitutional: Well-developed, obese female in no acute distress.  Cardiovascular: normal rate & rhythm, warm and well-perfused Respiratory: normal effort, no problems with respiration noted GI: Abd soft, non-tender, gravid MS: Extremities nontender, no pitting edema, normal ROM, normo reflexic Neurologic: Alert and oriented x 4.  GU: no CVA tenderness Pelvic: Deferred     Fetal Tracing: @ 1731 Cat 1 reactive  Baseline:130-135 Variability:moderate  Accelerations: present Decelerations: absent Toco: no ctx   Labs: Results for orders placed or performed during the hospital encounter of 01/06/24 (from the past 24 hours)  CBC     Status: Abnormal   Collection Time: 01/06/24  5:00 PM  Result Value Ref Range   WBC 11.0 (H) 4.0 - 10.5 K/uL   RBC 5.25 (H) 3.87 - 5.11 MIL/uL   Hemoglobin 12.7 12.0 - 15.0 g/dL   HCT 56.2 13.0 - 86.5 %   MCV 73.7 (L) 80.0 - 100.0 fL   MCH 24.2 (L) 26.0 - 34.0 pg   MCHC 32.8 30.0 - 36.0 g/dL   RDW 78.4 (H) 69.6 - 29.5 %   Platelets 218 150 - 400 K/uL   nRBC 0.0 0.0 - 0.2 %  Comprehensive metabolic panel     Status: Abnormal   Collection Time: 01/06/24  5:00 PM  Result Value Ref Range   Sodium 135 135 - 145 mmol/L   Potassium 3.5 3.5 - 5.1 mmol/L   Chloride 109 98 - 111 mmol/L   CO2 17 (L) 22 - 32 mmol/L   Glucose, Bld 108 (H) 70 - 99 mg/dL   BUN 9 6 - 20 mg/dL   Creatinine, Ser 2.84 0.44 - 1.00 mg/dL   Calcium 9.0 8.9 - 13.2 mg/dL   Total Protein 5.7 (L) 6.5 - 8.1 g/dL   Albumin 2.6 (L) 3.5 - 5.0 g/dL   AST 31 15 - 41 U/L   ALT 31 0 - 44 U/L   Alkaline Phosphatase 198 (H) 38 - 126 U/L  Total Bilirubin 0.5 0.0 - 1.2 mg/dL   GFR, Estimated >16 >10 mL/min   Anion gap 9 5 - 15     Imaging:  No results found.  MDM & MAU COURSE  MDM:  HIGH  BP's: normotensive with 1 MRBP noted PreE labs: Unremarkable U/A: patient unable to urinate  s/p  a pitcher of fluids NST : Cat 1 reactive HA resolved    I have reviewed the patient chart and performed the physical exam . I have ordered & interpreted the lab results and reviewed and interpreted the NST Medications ordered as stated below.  A/P as described below.  Counseling and education provided and patient agreeable  with plan as described below. Verbalized understanding.    MAU Course: Orders Placed This Encounter  Procedures   CBC   Comprehensive metabolic panel   Notify physician (specify) Confirmatory reading of BP> 160/110 15 minutes later   Apply Hypertensive Disorders of Pregnancy Care Plan   Vital signs   Measure blood pressure   Discharge patient Discharge disposition: 01-Home or Self Care; Discharge patient date: 01/06/2024   Meds ordered this encounter  Medications   AND Linked Order Group    NIFEdipine (PROCARDIA) capsule 10 mg    NIFEdipine (PROCARDIA) capsule 20 mg    NIFEdipine (PROCARDIA) capsule 20 mg    labetalol (NORMODYNE) injection 40 mg   fluticasone (FLONASE) 50 MCG/ACT nasal spray 2 spray    ASSESSMENT   1. Supervision of high risk pregnancy in third trimester   2. Headache in pregnancy, antepartum, third trimester   3. [redacted] weeks gestation of pregnancy   4. Chronic hypertension with exacerbation during pregnancy, antepartum, third trimester     PLAN  Discharge home in stable condition with strict return precautions. Patient verbalized understanding and agrees with plan as described below Strict precautions for s/s of Preeclampsia  reviewed Please see AVS for verbal and written instructions given too the patient   F/U 3/4 as scheduled   Follow-up Information     [facility] for [facility] Follow up.   Specialty: Obstetrics and Gynecology Why: If  symptoms worsen or fail to resolve, As scheduled for ongoing prenatal care Contact information: [address] [place] [address] [phone]                Allergies as of 01/06/2024       Reactions   Diclofenac Hives        Medication List     TAKE these medications    Accu-Chek Guide Me w/Device Kit 1 each by Does not apply route 4 (four) times daily.   Accu-Chek Softclix Lancets lancets Use as instructed to check blood sugar 4 times daily   aspirin EC 81 MG tablet Take 2 tablets (162 mg total) by mouth daily. Swallow whole.   Blood Pressure Monitor Misc For regular home bp monitoring during pregnancy   fluticasone 50 MCG/ACT nasal spray Commonly known as: FLONASE Place 1 spray into both nostrils daily.   glucose blood test strip Use as instructed to check blood sugar four times daily   labetalol 200 MG tablet Commonly known as: NORMODYNE Take 2 tablets (400 mg total) by mouth 3 (three) times daily.   Prenatal Vitamin/Min +DHA 27-0.8-200 MG Caps Take 1 tablet by mouth daily.       Marcell Barlow, MSN, [facility], Center for [employer]

## 2024-01-06 NOTE — Discharge Instructions (Signed)

## 2024-01-08 ENCOUNTER — Other Ambulatory Visit: Payer: Self-pay | Admitting: Obstetrics & Gynecology

## 2024-01-08 DIAGNOSIS — Z1379 Encounter for other screening for genetic and chromosomal anomalies: Secondary | ICD-10-CM

## 2024-01-08 DIAGNOSIS — O24410 Gestational diabetes mellitus in pregnancy, diet controlled: Secondary | ICD-10-CM

## 2024-01-08 DIAGNOSIS — O10919 Unspecified pre-existing hypertension complicating pregnancy, unspecified trimester: Secondary | ICD-10-CM

## 2024-01-08 DIAGNOSIS — O24419 Gestational diabetes mellitus in pregnancy, unspecified control: Secondary | ICD-10-CM

## 2024-01-08 DIAGNOSIS — Z124 Encounter for screening for malignant neoplasm of cervix: Secondary | ICD-10-CM

## 2024-01-08 DIAGNOSIS — O09523 Supervision of elderly multigravida, third trimester: Secondary | ICD-10-CM

## 2024-01-08 DIAGNOSIS — O0992 Supervision of high risk pregnancy, unspecified, second trimester: Secondary | ICD-10-CM

## 2024-01-08 DIAGNOSIS — O0993 Supervision of high risk pregnancy, unspecified, third trimester: Secondary | ICD-10-CM

## 2024-01-09 ENCOUNTER — Encounter: Payer: Medicaid Other | Admitting: Women's Health

## 2024-01-09 ENCOUNTER — Ambulatory Visit: Payer: Medicaid Other | Admitting: Radiology

## 2024-01-09 ENCOUNTER — Inpatient Hospital Stay (HOSPITAL_COMMUNITY)
Admission: AD | Admit: 2024-01-09 | Discharge: 2024-01-09 | Disposition: A | Discharge status: Home / Self Care | Payer: Medicaid Other | Attending: Obstetrics and Gynecology | Admitting: Obstetrics and Gynecology

## 2024-01-09 ENCOUNTER — Encounter (HOSPITAL_COMMUNITY): Payer: Self-pay | Admitting: Obstetrics and Gynecology

## 2024-01-09 ENCOUNTER — Ambulatory Visit: Admitting: *Deleted

## 2024-01-09 VITALS — BP 160/100 | HR 72 | Wt 240.8 lb

## 2024-01-09 DIAGNOSIS — O10013 Pre-existing essential hypertension complicating pregnancy, third trimester: Secondary | ICD-10-CM | POA: Insufficient documentation

## 2024-01-09 DIAGNOSIS — Z3A34 34 weeks gestation of pregnancy: Secondary | ICD-10-CM

## 2024-01-09 DIAGNOSIS — O10913 Unspecified pre-existing hypertension complicating pregnancy, third trimester: Secondary | ICD-10-CM | POA: Diagnosis not present

## 2024-01-09 DIAGNOSIS — O0993 Supervision of high risk pregnancy, unspecified, third trimester: Secondary | ICD-10-CM

## 2024-01-09 DIAGNOSIS — O24410 Gestational diabetes mellitus in pregnancy, diet controlled: Secondary | ICD-10-CM

## 2024-01-09 DIAGNOSIS — I252 Old myocardial infarction: Secondary | ICD-10-CM | POA: Diagnosis not present

## 2024-01-09 DIAGNOSIS — O09523 Supervision of elderly multigravida, third trimester: Secondary | ICD-10-CM

## 2024-01-09 DIAGNOSIS — O0992 Supervision of high risk pregnancy, unspecified, second trimester: Secondary | ICD-10-CM

## 2024-01-09 DIAGNOSIS — O24419 Gestational diabetes mellitus in pregnancy, unspecified control: Secondary | ICD-10-CM

## 2024-01-09 DIAGNOSIS — I10 Essential (primary) hypertension: Secondary | ICD-10-CM

## 2024-01-09 DIAGNOSIS — R030 Elevated blood-pressure reading, without diagnosis of hypertension: Secondary | ICD-10-CM | POA: Diagnosis present

## 2024-01-09 DIAGNOSIS — Z0130 Encounter for examination of blood pressure without abnormal findings: Secondary | ICD-10-CM

## 2024-01-09 DIAGNOSIS — O10919 Unspecified pre-existing hypertension complicating pregnancy, unspecified trimester: Secondary | ICD-10-CM

## 2024-01-09 DIAGNOSIS — O163 Unspecified maternal hypertension, third trimester: Secondary | ICD-10-CM

## 2024-01-09 DIAGNOSIS — O10019 Pre-existing essential hypertension complicating pregnancy, unspecified trimester: Secondary | ICD-10-CM

## 2024-01-09 LAB — COMPREHENSIVE METABOLIC PANEL
ALT: 26 U/L (ref 0–44)
AST: 30 U/L (ref 15–41)
Albumin: 2.7 g/dL — ABNORMAL LOW (ref 3.5–5.0)
Alkaline Phosphatase: 185 U/L — ABNORMAL HIGH (ref 38–126)
Anion gap: 10 (ref 5–15)
BUN: 9 mg/dL (ref 6–20)
CO2: 18 mmol/L — ABNORMAL LOW (ref 22–32)
Calcium: 9 mg/dL (ref 8.9–10.3)
Chloride: 108 mmol/L (ref 98–111)
Creatinine, Ser: 0.57 mg/dL (ref 0.44–1.00)
GFR, Estimated: >60 mL/min (ref >60)
Glucose, Bld: 120 mg/dL — ABNORMAL HIGH (ref 70–99)
Potassium: 3.5 mmol/L (ref 3.5–5.1)
Sodium: 136 mmol/L (ref 135–145)
Total Bilirubin: 0.5 mg/dL (ref 0.0–1.2)
Total Protein: 5.8 g/dL — ABNORMAL LOW (ref 6.5–8.1)

## 2024-01-09 LAB — PROTEIN / CREATININE RATIO, URINE
Creatinine, Urine: 230 mg/dL
Protein Creatinine Ratio: 0.1 mg/mg{creat} (ref 0.00–0.15)
Total Protein, Urine: 23 mg/dL

## 2024-01-09 LAB — POCT URINALYSIS DIPSTICK OB
Blood, UA: NEGATIVE
Glucose, UA: NEGATIVE
Ketones, UA: NEGATIVE
Leukocytes, UA: NEGATIVE
Nitrite, UA: NEGATIVE

## 2024-01-09 LAB — CBC
HCT: 39.4 % (ref 36.0–46.0)
Hemoglobin: 13.1 g/dL (ref 12.0–15.0)
MCH: 24.4 pg — ABNORMAL LOW (ref 26.0–34.0)
MCHC: 33.2 g/dL (ref 30.0–36.0)
MCV: 73.4 fL — ABNORMAL LOW (ref 80.0–100.0)
Platelets: 234 10*3/uL (ref 150–400)
RBC: 5.37 MIL/uL — ABNORMAL HIGH (ref 3.87–5.11)
RDW: 20.6 % — ABNORMAL HIGH (ref 11.5–15.5)
WBC: 8.7 10*3/uL (ref 4.0–10.5)
nRBC: 0 % (ref 0.0–0.2)

## 2024-01-09 MED ORDER — ACETAMINOPHEN-CAFFEINE 500-65 MG PO TABS: 2.0000 | ORAL_TABLET | Freq: Four times a day (QID) | ORAL | 0 refills | Status: DC | PRN

## 2024-01-09 MED ORDER — LABETALOL HCL 100 MG PO TABS
400.0000 mg | ORAL_TABLET | Freq: Once | ORAL | Status: AC
  Administered 2024-01-09: 400 mg via ORAL
  Filled 2024-01-09: qty 4

## 2024-01-09 MED ORDER — NIFEDIPINE 10 MG PO CAPS: 20.0000 mg | ORAL_CAPSULE | ORAL | Status: DC | PRN

## 2024-01-09 MED ORDER — LABETALOL HCL 5 MG/ML IV SOLN: 40.0000 mg | INTRAVENOUS | Status: DC | PRN

## 2024-01-09 MED ORDER — LABETALOL HCL 200 MG PO TABS: 600.0000 mg | ORAL_TABLET | Freq: Three times a day (TID) | ORAL | 3 refills | Status: DC

## 2024-01-09 MED ORDER — NIFEDIPINE 10 MG PO CAPS: 10.0000 mg | ORAL_CAPSULE | ORAL | Status: DC | PRN

## 2024-01-09 NOTE — Progress Notes (Addendum)
   NURSE VISIT- BLOOD PRESSURE CHECK  SUBJECTIVE:  Kaitlin Torres is a 43 y.o. G56P3003 female here for BP check. She is [redacted]w[redacted]d pregnant    HYPERTENSION ROS:  Pregnant/postpartum:  Severe headaches that don't go away with tylenol/other medicines: No  Visual changes (seeing spots/double/blurred vision) No  Severe pain under right breast breast or in center of upper chest No  Severe nausea/vomiting No  Taking medicines as instructed yes    OBJECTIVE:  BP (!) 151/98   Pulse 72   Wt 240 lb 12.8 oz (109.2 kg)   LMP 05/16/2023   BMI 35.56 kg/m   Appearance alert, well appearing, and in no distress. 160/100- manual  Pts BP's at home over the past 2 days have been as follows:  167/110 157/104 162/113 156/103 Pt reports that she just doesn't feel well.   ASSESSMENT: Pregnancy [redacted]w[redacted]d  blood pressure check  PLAN: Discussed with Joellyn Haff, CNM, [facility]   Recommendations:  go to MAU for evaluation    Follow-up: as scheduled   Annamarie Dawley  01/09/2024 2:17 PM   Chart reviewed for nurse visit. Agree with plan of care. Tr proteinuria, on labetalol 400mg  TID, asymptomatic, h/o MI- recommended going to MAU for further eval d/t severe range bp's despite meds. Notified Dr. Alvester Morin and Gerrit Heck, CNM.  Cheral Marker, [place] 01/09/2024 2:40 PM

## 2024-01-09 NOTE — Progress Notes (Signed)
 [place]:  GA = 34 weeks Single active female fetus, cephalic, FHR = 123 bpm,  anterior pl, gr 1 AFI = 15.3 cm, 57%, MVP = 5.3 cm,  BPP = 8/8, RI: 0.70, 0.59, 0.59  61% EFW 33%, 2245g    small intramural fibroid lower Rt lat wall = 29 x 21 mm  nl ov's,   cervix appears long/closed

## 2024-01-09 NOTE — MAU Provider Note (Signed)
 Chief Complaint:  Hypertension   HPI    Kaitlin Torres is a 43 y.o. [phone] at [redacted]w[redacted]d who presents to maternity admissions reporting elevated BP's at home 160's/100's  and has been on Labetalol 400 mg TID. She was seen in the office today reporting  elevated BP 168/111 at home with a repeat of 143/101 after taking her medication. BP in the office today was 139/84. She had previously been seen in MAU on Sat 3/1 for elevated BP with a HA which resolved.  On presentation her initial BP was 139/83. Denies any current HA's, visual changes, RUQ pain, N/V/D or SOB.  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   Cough    Generalized headaches    Heart attack (HCC) 04/2019   Hyperlipidemia    Hypertension    PCOS (polycystic ovarian syndrome)    Rectal pain    Vitamin D deficiency 01/15/2021   OB History  Gravida Para Term Preterm AB Living  4 3 3   3   SAB IAB Ectopic Multiple Live Births      3    # Outcome Date GA Lbr Len/2nd Weight Sex Type Anes PTL Lv  4 Current           3 Term 2010   2722 g F Vag-Spont   LIV  2 Term 2004   2722 g F Vag-Spont   LIV  1 Term 2002   2722 g F Vag-Spont  N LIV   Past Surgical History:  Procedure Laterality Date   ANKLE SURGERY  05/2004   screws placed in right ankle    BREAST DUCTAL SYSTEM EXCISION Right 03/17/2021   Ductal papilloma, hyalinized.  Fibrocystic change.  Usual duct  epithelial hyperplasia.   CORONARY STENT INTERVENTION N/A 05/08/2019   Procedure: CORONARY STENT INTERVENTION;  Surgeon: Swaziland, Peter M, MD;  Location: [facility] INVASIVE CV LAB;  Service: Cardiovascular;  Laterality: N/A;   FRACTURE SURGERY  2005   right ankle   HEMORRHOID SURGERY     HEMORRHOID SURGERY  2015   INCISION AND DRAINAGE BREAST ABSCESS  09/2010   left breast    LEFT HEART CATH AND  CORONARY ANGIOGRAPHY N/A 05/08/2019   Procedure: LEFT HEART CATH AND CORONARY ANGIOGRAPHY;  Surgeon: Swaziland, Peter M, MD;  Location: [facility] INVASIVE CV LAB;  Service: Cardiovascular;  Laterality: N/A;   Family History  Problem Relation Age of Onset   Hypertension Mother    Peripheral Artery Disease Mother    Heart disease Father        Cardiac arrest 2006   Diabetes Father    Hyperlipidemia Father    Stroke Father    Aneurysm Brother    Cervical cancer Maternal Grandmother    Diabetes Paternal Grandmother    Social History   Tobacco Use   Smoking status: Former    Current packs/day: 0.00    Average packs/day: 0.1 packs/day for 26.0 years (2.6 ttl pk-yrs)    Types: Cigarettes    Start date: 09/15/1993    Quit date: 09/16/2019    Years since quitting: 4.3   Smokeless tobacco: Never  Vaping Use   Vaping status: Every Day   Substances: Nicotine  Substance Use Topics   Alcohol use: No    Comment: quit 2018   Drug  use: No   Allergies  Allergen Reactions   Diclofenac Hives   Medications Prior to Admission  Medication Sig Dispense Refill Last Dose/Taking   aspirin EC 81 MG tablet Take 2 tablets (162 mg total) by mouth daily. Swallow whole. 180 tablet 2 01/09/2024   [DISCONTINUED] labetalol (NORMODYNE) 200 MG tablet Take 2 tablets (400 mg total) by mouth 3 (three) times daily. 180 tablet 3 01/09/2024   Accu-Chek Softclix Lancets lancets Use as instructed to check blood sugar 4 times daily 100 each 12    Blood Glucose Monitoring Suppl (ACCU-CHEK GUIDE ME) w/Device KIT 1 each by Does not apply route 4 (four) times daily. 1 kit 0    Blood Pressure Monitor MISC For regular home bp monitoring during pregnancy 1 each 0    fluticasone (FLONASE) 50 MCG/ACT nasal spray Place 1 spray into both nostrils daily. 9.9 mL 2    glucose blood test strip Use as instructed to check blood sugar four times daily 100 each 12    Prenatal Vit-Fe Sulfate-FA-DHA (PRENATAL VITAMIN/MIN +DHA) 27-0.8-200 MG CAPS Take  1 tablet by mouth daily. 100 capsule 11     I have reviewed patient's Past Medical Hx, Surgical Hx, Family Hx, Social Hx, medications and allergies.   ROS  Pertinent items noted in HPI and remainder of comprehensive ROS otherwise negative.   PHYSICAL EXAM  Patient Vitals for the past 24 hrs:  BP Temp Temp src Pulse Resp SpO2  01/09/24 1900 (!) 146/85 -- -- 74 -- 99 %  01/09/24 1845 (!) 157/81 -- -- 70 -- 99 %  01/09/24 1830 (!) 150/86 -- -- 74 -- 100 %  01/09/24 1815 (!) 142/82 -- -- 71 -- 100 %  01/09/24 1800 136/83 -- -- 75 -- 100 %  01/09/24 1745 (!) 140/77 -- -- 73 -- 98 %  01/09/24 1731 (!) 140/82 -- -- 75 -- --  01/09/24 1730 139/83 (!) 97.4 F (36.3 C) Oral 79 16 99 %    Constitutional: Well-developed, obese  female in no acute distress.  Cardiovascular: normal rate & rhythm, warm and well-perfused Respiratory: normal effort, no problems with respiration noted, Lungs BCTA GI: Abd soft, non-tender, gravid, no RUQ pain illicited MS: Extremities nontender, no lower extremity  edema, normal ROM, normo reflexic Neurologic: Alert and oriented x 4.  GU: no CVA tenderness Pelvic:deferred     Fetal Tracing: @ 1700 Cat 1  Baseline: 130 Variability: moderate  Accelerations: present Decelerations: absent Toco: no ctx   Labs: Results for orders placed or performed during the hospital encounter of 01/09/24 (from the past 24 hours)  Protein / creatinine ratio, urine     Status: None   Collection Time: 01/09/24  5:15 PM  Result Value Ref Range   Creatinine, Urine 230 mg/dL   Total Protein, Urine 23 mg/dL   Protein Creatinine Ratio 0.10 0.00 - 0.15 mg/mg[Cre]  CBC     Status: Abnormal   Collection Time: 01/09/24  5:37 PM  Result Value Ref Range   WBC 8.7 4.0 - 10.5 K/uL   RBC 5.37 (H) 3.87 - 5.11 MIL/uL   Hemoglobin 13.1 12.0 - 15.0 g/dL   HCT 09.8 11.9 - 14.7 %   MCV 73.4 (L) 80.0 - 100.0 fL   MCH 24.4 (L) 26.0 - 34.0 pg   MCHC 33.2 30.0 - 36.0 g/dL   RDW 82.9 (H) 56.2  - 15.5 %   Platelets 234 150 - 400 K/uL   nRBC 0.0 0.0 - 0.2 %  Comprehensive metabolic panel     Status: Abnormal   Collection Time: 01/09/24  5:37 PM  Result Value Ref Range   Sodium 136 135 - 145 mmol/L   Potassium 3.5 3.5 - 5.1 mmol/L   Chloride 108 98 - 111 mmol/L   CO2 18 (L) 22 - 32 mmol/L   Glucose, Bld 120 (H) 70 - 99 mg/dL   BUN 9 6 - 20 mg/dL   Creatinine, Ser 1.61 0.44 - 1.00 mg/dL   Calcium 9.0 8.9 - 09.6 mg/dL   Total Protein 5.8 (L) 6.5 - 8.1 g/dL   Albumin 2.7 (L) 3.5 - 5.0 g/dL   AST 30 15 - 41 U/L   ALT 26 0 - 44 U/L   Alkaline Phosphatase 185 (H) 38 - 126 U/L   Total Bilirubin 0.5 0.0 - 1.2 mg/dL   GFR, Estimated >04 >54 mL/min   Anion gap 10 5 - 15    Imaging:  No results found.  MDM & MAU COURSE  MDM:  HIGH  - Labs: unremarkable with NM PC Ratio and asymptomatic  - cHTN r/o superimposed PreE ( No evidence of super imposed PreE at this time) - BP's at home 160's/111 (pt reported) - BP's here 140's-157 systolic's with diastolic's in the 80's  - NST Cat 1 reactive   Plan to increase Labetalol to 600 mg TID d/t elevated BP's in pregnancy as d/w Dr Jolayne Panther  ( OB Attending)   I have reviewed the patient chart and performed the physical exam . I have ordered & interpreted the lab results and reviewed and interpreted the NST Medications ordered as stated below.  A/P as described below.  Counseling and education provided and patient agreeable  with plan as described below. Verbalized understanding.    MAU Course: Orders Placed This Encounter  Procedures   CBC   Comprehensive metabolic panel   Protein / creatinine ratio, urine   Notify physician (specify) Confirmatory reading of BP> 160/110 15 minutes later   Apply Hypertensive Disorders of Pregnancy Care Plan   Measure blood pressure   Vital signs   Discharge patient Discharge disposition: 01-Home or Self Care; Discharge patient date: 01/09/2024   Discharge patient Discharge disposition: 01-Home or  Self Care; Discharge patient date: 01/09/2024   Meds ordered this encounter  Medications   AND Linked Order Group    NIFEdipine (PROCARDIA) capsule 10 mg    NIFEdipine (PROCARDIA) capsule 20 mg    NIFEdipine (PROCARDIA) capsule 20 mg    labetalol (NORMODYNE) injection 40 mg   labetalol (NORMODYNE) tablet 400 mg   labetalol (NORMODYNE) 200 MG tablet    Sig: Take 3 tablets (600 mg total) by mouth 3 (three) times daily.    Dispense:  270 tablet    Refill:  3    Supervising Provider:   Reva Bores [[id number]]   acetaminophen-caffeine (EXCEDRIN TENSION HEADACHE) 500-65 MG TABS per tablet    Sig: Take 2 tablets by mouth 4 (four) times daily as needed (Foe headaches).    Dispense:  60 tablet    Refill:  0    Supervising Provider:   Reva Bores [[id number]]    ASSESSMENT   1. Pre-existing essential hypertension during pregnancy, antepartum   2. Elevated blood pressure affecting pregnancy in third trimester, antepartum   3. Essential hypertension   4. History of non-ST elevation myocardial infarction (NSTEMI)   5. [redacted] weeks gestation of pregnancy     PLAN  Discharge home in stable condition with  return precautions.  Please see AVS for all written and verbal instructions provided to the patient She verbalized understanding of changes in medication   Future Appointments  Date Time Provider Department Center  01/11/2024  2:00 PM Tobb, Kardie, DO [facility] None  01/12/2024 10:50 AM [facility] NURSE [facility]  01/12/2024 10:50 AM Myna Hidalgo, DO [facility]  01/16/2024  2:15 PM [facility] IMG 2 [facility] None  01/16/2024  3:10 PM Lazaro Arms, MD [facility]  01/19/2024 10:50 AM [facility] NURSE [facility]  01/23/2024  1:30 PM [facility] IMG 2 [facility] None  01/23/2024  2:50 PM Myna Hidalgo, DO [facility]  01/26/2024 10:50 AM [facility] NURSE [facility]  01/30/2024  1:30 PM [facility] IMG 2 [facility] None  01/30/2024  2:50 PM Myna Hidalgo, DO [facility]  02/02/2024  10:50 AM [facility] NURSE [facility]  02/06/2024  1:30 PM [facility] IMG 2 [facility] None  02/06/2024  2:30 PM Lazaro Arms, MD [facility]  02/09/2024 10:50 AM [facility] NURSE [facility]  02/13/2024  1:30 PM [facility] IMG 2 [facility] None  02/13/2024  2:30 PM Lazaro Arms, MD [facility]  02/16/2024 10:50 AM [facility] NURSE [facility]       Allergies as of 01/09/2024       Reactions   Diclofenac Hives        Medication List     TAKE these medications    Accu-Chek Guide Me w/Device Kit 1 each by Does not apply route 4 (four) times daily.   Accu-Chek Softclix Lancets lancets Use as instructed to check blood sugar 4 times daily   acetaminophen-caffeine 500-65 MG Tabs per tablet Commonly known as: EXCEDRIN TENSION HEADACHE Take 2 tablets by mouth 4 (four) times daily as needed (Foe headaches).   aspirin EC 81 MG tablet Take 2 tablets (162 mg total) by mouth daily. Swallow whole.   Blood Pressure Monitor Misc For regular home bp monitoring during pregnancy   fluticasone 50 MCG/ACT nasal spray Commonly known as: FLONASE Place 1 spray into both nostrils daily.   glucose blood test strip Use as instructed to check blood sugar four times daily   labetalol 200 MG tablet Commonly known as: NORMODYNE Take 3 tablets (600 mg total) by mouth 3 (three) times daily. What changed: how much to take   Prenatal Vitamin/Min +DHA 27-0.8-200 MG Caps Take 1 tablet by mouth daily.        Marcell Barlow, MSN, [facility], [facility] [employer]

## 2024-01-09 NOTE — MAU Note (Addendum)
.  Kaitlin Torres is a 43 y.o. at [redacted]w[redacted]d here in MAU reporting: Here for further evaluation of BP's. Patient was seen in office today and had two elevated pressures including an elevated BP of 160/100. Patient reports she is taking Labetalol 400 TID. She reports over the last two days she has had occasional severe range BP's at home and has not felt well. Seen here in MAU on 3/1 for elevated BP's as well. POC Urinalysis Dipstick in office today demonstrated trace proteinuria. Denies HA, RUQ/epigastric pain, visual disturbances, chest pain, and edema. Denies VB or LOF. +FM.  Last doses: Labetalol 400 mg 0908 this morning - has not taken her second dose as she has been busy today  Hx MI in 2020. AMA. CHTN. GDM  Onset of complaint: Today Pain score: Denies pain.  There were no vitals filed for this visit.    FHT: 125 initial external Lab orders placed from triage: placed by NP

## 2024-01-11 ENCOUNTER — Encounter: Payer: Self-pay | Admitting: Cardiology

## 2024-01-11 ENCOUNTER — Ambulatory Visit: Payer: Medicaid Other | Attending: Cardiology | Admitting: Cardiology

## 2024-01-11 VITALS — BP 142/92 | HR 80 | Ht 69.0 in | Wt 240.2 lb

## 2024-01-11 DIAGNOSIS — Z3A34 34 weeks gestation of pregnancy: Secondary | ICD-10-CM | POA: Diagnosis not present

## 2024-01-11 DIAGNOSIS — I2510 Atherosclerotic heart disease of native coronary artery without angina pectoris: Secondary | ICD-10-CM | POA: Diagnosis not present

## 2024-01-11 DIAGNOSIS — Z79899 Other long term (current) drug therapy: Secondary | ICD-10-CM

## 2024-01-11 DIAGNOSIS — O1203 Gestational edema, third trimester: Secondary | ICD-10-CM | POA: Diagnosis not present

## 2024-01-11 DIAGNOSIS — O10919 Unspecified pre-existing hypertension complicating pregnancy, unspecified trimester: Secondary | ICD-10-CM

## 2024-01-11 DIAGNOSIS — E782 Mixed hyperlipidemia: Secondary | ICD-10-CM

## 2024-01-11 MED ORDER — FUROSEMIDE 40 MG PO TABS: 40.0000 mg | ORAL_TABLET | Freq: Every day | ORAL | 0 refills | Status: DC

## 2024-01-11 MED ORDER — POTASSIUM CHLORIDE CRYS ER 20 MEQ PO TBCR: 20.0000 meq | EXTENDED_RELEASE_TABLET | Freq: Every day | ORAL | 0 refills | Status: DC

## 2024-01-11 MED ORDER — NIFEDIPINE ER OSMOTIC RELEASE 30 MG PO TB24: 30.0000 mg | ORAL_TABLET | Freq: Every day | ORAL | 4 refills | Status: DC

## 2024-01-11 NOTE — Patient Instructions (Addendum)
 Medication Instructions:  Your physician has recommended you make the following change in your medication:  START: Nifedipine 30 mg once daily For 3 days:  Lasix 40 mg once daily For 3 days: Potassium 20 mEq once daily *If you need a refill on your cardiac medications before your next appointment, please call your pharmacy*   Lab Work: CMET, Mag If you have labs (blood work) drawn today and your tests are completely normal, you will receive your results only by: MyChart Message (if you have MyChart) OR A paper copy in the mail If you have any lab test that is abnormal or we need to change your treatment, we will call you to review the results.   Follow-Up: At [facility], you and your health needs are our priority.  As part of our continuing mission to provide you with exceptional heart care, we have created designated Provider Care Teams.  These Care Teams include your primary Cardiologist (physician) and Advanced Practice Providers (APPs -  Physician Assistants and Nurse Practitioners) who all work together to provide you with the care you need, when you need it.   Your next appointment:    March 21st with Iran Planas (pharmacy)  April 4th with Dr. Servando Salina, DO

## 2024-01-11 NOTE — Progress Notes (Signed)
 Cardio-Obstetrics Clinic  New Evaluation  Date:  01/11/2024   ID:  Kaitlin Torres, DOB [birth year], MRN [id number]  PCP:  Sonny Masters, FNP   [facility] Providers Cardiologist:  Thomasene Ripple, DO  Electrophysiologist:  None       Referring MD: Sonny Masters, FNP   Chief Complaint: " I am ok"  History of Present Illness:    Kaitlin Torres is a 43 y.o. female [[id number]] who is being seen today in follow up.    Medical hx include CAD s/p PCI to the RCA and OM vessels in 2020, Familial hypercholesteremia, obesity, Tobacco use/vapes, obesity, hypertension.   At her last visit 7 blood pressure was at target.  No changes were made.  Today she tells me since her last visit she has been seen for high blood pressure.  She reports significant leg swelling as well.  She is in office with her significant other.  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  Fibrocystic change.  Usual duct  epithelial hyperplasia.   CORONARY STENT INTERVENTION N/A 05/08/2019   Procedure: CORONARY STENT INTERVENTION;  Surgeon: Swaziland, Peter M, MD;  Location: [facility] INVASIVE CV LAB;  Service: Cardiovascular;  Laterality: N/A;   FRACTURE SURGERY  2005   right ankle   HEMORRHOID SURGERY     HEMORRHOID SURGERY  2015   INCISION AND DRAINAGE BREAST ABSCESS  09/2010   left breast    LEFT HEART CATH AND CORONARY ANGIOGRAPHY N/A 05/08/2019   Procedure: LEFT HEART CATH AND CORONARY ANGIOGRAPHY;  Surgeon:  Swaziland, Peter M, MD;  Location: [facility] INVASIVE CV LAB;  Service: Cardiovascular;  Laterality: N/A;      OB History     Gravida  4   Para  3   Term  3   Preterm      AB      Living  3      SAB      IAB      Ectopic      Multiple      Live Births  3               Current Medications: Current Meds  Medication Sig   Accu-Chek Softclix Lancets lancets Use as instructed to check blood sugar 4 times daily   acetaminophen-caffeine (EXCEDRIN TENSION HEADACHE) 500-65 MG TABS per tablet Take 2 tablets by mouth 4 (four) times daily as needed (Foe headaches).   aspirin EC 81 MG tablet Take 2 tablets (162 mg total) by mouth daily. Swallow whole.   Blood Glucose Monitoring Suppl (ACCU-CHEK GUIDE ME) w/Device KIT 1 each by Does not apply route 4 (four) times daily.   Blood Pressure Monitor MISC For regular home bp monitoring during pregnancy   fluticasone (FLONASE) 50 MCG/ACT nasal spray Place 1 spray into both nostrils daily.   glucose blood test strip Use as instructed to check blood sugar  four times daily   labetalol (NORMODYNE) 200 MG tablet Take 3 tablets (600 mg total) by mouth 3 (three) times daily.   Prenatal Vit-Fe Sulfate-FA-DHA (PRENATAL VITAMIN/MIN +DHA) 27-0.8-200 MG CAPS Take 1 tablet by mouth daily.     Allergies:   Diclofenac   Social History   Socioeconomic History   Marital status: Single    Spouse name: Not on file   Number of children: 3   Years of education: 14   Highest education level: Associate degree: academic program  Occupational History   Occupation: Waitress  Tobacco Use   Smoking status: Former    Current packs/day: 0.00    Average packs/day: 0.1 packs/day for 26.0 years (2.6 ttl pk-yrs)    Types: Cigarettes    Start date: 09/15/1993    Quit date: 09/16/2019    Years since quitting: 4.3   Smokeless tobacco: Never  Vaping Use   Vaping status: Every Day   Substances: Nicotine  Substance and Sexual Activity   Alcohol use: No    Comment:  quit 2018   Drug use: No   Sexual activity: Yes    Birth control/protection: None  Other Topics Concern   Not on file  Social History Narrative   Lives at home with her mother, husband and three daughters.   Right-handed.   Approximately 20 cups caffeine per day (4-5 glasses holding 32oz of tea).   Social Drivers of Health   Financial Resource Strain: Medium Risk (07/06/2023)   Overall Financial Resource Strain (CARDIA)    Difficulty of Paying Living Expenses: Somewhat hard  Food Insecurity: No Food Insecurity (12/07/2023)   Hunger Vital Sign    Worried About Running Out of Food in the Last Year: Never true    Ran Out of Food in the Last Year: Never true  Transportation Needs: No Transportation Needs (07/06/2023)   PRAPARE - Administrator, Civil Service (Medical): No    Lack of Transportation (Non-Medical): No  Physical Activity: Sufficiently Active (07/06/2023)   Exercise Vital Sign    Days of Exercise per Week: 5 days    Minutes of Exercise per Session: 90 min  Stress: No Stress Concern Present (07/06/2023)   [employer] of Occupational Health - Occupational Stress Questionnaire    Feeling of Stress : Only a little  Social Connections: Moderately Isolated (07/06/2023)   Social Connection and Isolation Panel [NHANES]    Frequency of Communication with Friends and Family: More than three times a week    Frequency of Social Gatherings with Friends and Family: More than three times a week    Attends Religious Services: Never    Database administrator or Organizations: No    Attends Engineer, structural: Never    Marital Status: Living with partner      Family History  Problem Relation Age of Onset   Hypertension Mother    Peripheral Artery Disease Mother    Heart disease Father        Cardiac arrest 2006   Diabetes Father    Hyperlipidemia Father    Stroke Father    Aneurysm Brother    Cervical cancer Maternal Grandmother    Diabetes Paternal  Grandmother       ROS:   Please see the history of present illness.     All other systems reviewed and are negative.   Labs/EKG Reviewed:    EKG:   EKG was ordered today.  The ekg ordered today demonstrates sinus  rhythm, heart rate 96 bpm  Recent Labs: 02/05/2023: B Natriuretic Peptide 16.0 01/09/2024: ALT 26; BUN 9; Creatinine, Ser 0.57; Hemoglobin 13.1; Platelets 234; Potassium 3.5; Sodium 136   Recent Lipid Panel Lab Results  Component Value Date/Time   CHOL 202 (H) 11/01/2022 09:49 AM   TRIG 103 11/01/2022 09:49 AM   HDL 36 (L) 11/01/2022 09:49 AM   CHOLHDL 5.6 (H) 11/01/2022 09:49 AM   CHOLHDL 10.0 05/09/2019 03:57 AM   LDLCALC 147 (H) 11/01/2022 09:49 AM    Physical Exam:    VS:  BP (!) 142/92 (BP Location: Right Arm, Patient Position: Sitting, Cuff Size: Normal)   Pulse 80   Ht 5\' 9"  (1.753 m)   Wt 240 lb 3.2 oz (109 kg)   LMP 05/16/2023   SpO2 97%   BMI 35.47 kg/m     Wt Readings from Last 3 Encounters:  01/11/24 240 lb 3.2 oz (109 kg)  01/09/24 240 lb 12.8 oz (109.2 kg)  01/06/24 241 lb 11.2 oz (109.6 kg)     GEN:  Well nourished, well developed in no acute distress HEENT: Normal NECK: No JVD; No carotid bruits LYMPHATICS: No lymphadenopathy CARDIAC: RRR, no murmurs, rubs, gallops RESPIRATORY:  Clear to auscultation without rales, wheezing or rhonchi  ABDOMEN: Soft, non-tender, non-distended MUSCULOSKELETAL:  No edema; No deformity  SKIN: Warm and dry NEUROLOGIC:  Alert and oriented x 3 PSYCHIATRIC:  Normal affect    Risk Assessment/Risk Calculators:                 ASSESSMENT & PLAN:    Coronary Artery Disease in Pregnancy - Status post PCI with drug eluted stents to RCA and OM in 2020. Recent echo was normal.  Continue aspirin 81 mg daily. -Advise patient to monitor blood pressure daily at home. -Plan to reinitiate statin therapy postpartum if not breastfeeding. -Follow up in 2 weeks with Laural Golden -4 weeks with me weeks   Chronic  hypertension in pregnancy -she is hypertensive in the office today.  Will add nifedipine 30 mg daily to her regimen.  She has had some leg swelling.  Will give the patient Lasix x 3 days with potassium supplement.  Blood work will be done today.  HLN- she prefers to hold statin. Counseled on ASCVD risk reduction with diet while not on statin she is high ris ASCVD patient   Tobacco use - cessation advised.    There are no Patient Instructions on file for this visit.   Dispo:  No follow-ups on file.   Medication Adjustments/Labs and Tests Ordered: Current medicines are reviewed at length with the patient today.  Concerns regarding medicines are outlined above.  Tests Ordered: No orders of the defined types were placed in this encounter.  Medication Changes: No orders of the defined types were placed in this encounter.

## 2024-01-11 NOTE — Addendum Note (Signed)
 Addended by: Reynolds Bowl on: 01/11/2024 02:39 PM   Modules accepted: Orders

## 2024-01-12 ENCOUNTER — Other Ambulatory Visit: Payer: Medicaid Other

## 2024-01-12 ENCOUNTER — Ambulatory Visit: Payer: Medicaid Other | Admitting: Obstetrics & Gynecology

## 2024-01-12 ENCOUNTER — Encounter: Payer: Self-pay | Admitting: Obstetrics & Gynecology

## 2024-01-12 VITALS — BP 135/89 | HR 72 | Wt 238.0 lb

## 2024-01-12 DIAGNOSIS — Z3A34 34 weeks gestation of pregnancy: Secondary | ICD-10-CM | POA: Diagnosis not present

## 2024-01-12 DIAGNOSIS — I252 Old myocardial infarction: Secondary | ICD-10-CM | POA: Diagnosis not present

## 2024-01-12 DIAGNOSIS — O10913 Unspecified pre-existing hypertension complicating pregnancy, third trimester: Secondary | ICD-10-CM

## 2024-01-12 DIAGNOSIS — I10 Essential (primary) hypertension: Secondary | ICD-10-CM | POA: Diagnosis not present

## 2024-01-12 DIAGNOSIS — O0993 Supervision of high risk pregnancy, unspecified, third trimester: Secondary | ICD-10-CM

## 2024-01-12 DIAGNOSIS — O24419 Gestational diabetes mellitus in pregnancy, unspecified control: Secondary | ICD-10-CM | POA: Diagnosis not present

## 2024-01-12 LAB — COMPREHENSIVE METABOLIC PANEL
ALT: 23 IU/L (ref 0–32)
AST: 24 IU/L (ref 0–40)
Albumin: 3.4 g/dL — ABNORMAL LOW (ref 3.9–4.9)
Alkaline Phosphatase: 267 IU/L — ABNORMAL HIGH (ref 44–121)
BUN/Creatinine Ratio: 15 (ref 9–23)
BUN: 8 mg/dL (ref 6–24)
Bilirubin Total: 0.2 mg/dL (ref 0.0–1.2)
CO2: 17 mmol/L — ABNORMAL LOW (ref 20–29)
Calcium: 9.3 mg/dL (ref 8.7–10.2)
Chloride: 108 mmol/L — ABNORMAL HIGH (ref 96–106)
Creatinine, Ser: 0.52 mg/dL — ABNORMAL LOW (ref 0.57–1.00)
Globulin, Total: 2.3 g/dL (ref 1.5–4.5)
Glucose: 107 mg/dL — ABNORMAL HIGH (ref 70–99)
Potassium: 3.9 mmol/L (ref 3.5–5.2)
Sodium: 137 mmol/L (ref 134–144)
Total Protein: 5.7 g/dL — ABNORMAL LOW (ref 6.0–8.5)
eGFR: 120 mL/min/{1.73_m2} (ref >59)

## 2024-01-12 LAB — MAGNESIUM: Magnesium: 1.6 mg/dL (ref 1.6–2.3)

## 2024-01-12 NOTE — Progress Notes (Signed)
 HIGH-RISK PREGNANCY VISIT Patient name: Kaitlin Torres MRN [id number]  Date of birth: 11-01-1981 Chief Complaint:   Routine Prenatal Visit and Non-stress Test  History of Present Illness:   Kaitlin Torres is a 43 y.o. G74P3003 female at [redacted]w[redacted]d with an Estimated Date of Delivery: 02/20/24 being seen today for ongoing management of a high-risk pregnancy complicated by:  -Chronic HTN with h.o NSTEMI Seen by Dr. Servando Salina- now on Labetalol 600mg  TID, NifedipineXL 30mg  daily and Lasix 40mg  x 3 day Pt has follow up in 2 wks  GDMA1 -checking occasionally, within normal range  Today she reports no complaints.   Contractions: Not present. Denies vaginal bleeding .  Movement: Present. denies leaking of fluid.      08/22/2023    2:45 PM 07/06/2023    3:48 PM 12/22/2022    3:55 PM 11/01/2022    9:36 AM 05/17/2022   11:49 AM  Depression screen PHQ 2/9  Decreased Interest 0 0 0 0 0  Down, Depressed, Hopeless 0 0 0 0 0  PHQ - 2 Score 0 0 0 0 0  Altered sleeping 3 0 0 0 0  Tired, decreased energy 3 3 0 0 0  Change in appetite 0 0 0 0 0  Feeling bad or failure about yourself  0 0 0 0 0  Trouble concentrating 0 0 0 0 0  Moving slowly or fidgety/restless 0 0 0 0 0  Suicidal thoughts 0 0 0 0 0  PHQ-9 Score 6 3 0 0 0  Difficult doing work/chores   Not difficult at all Not difficult at all Not difficult at all     Current Outpatient Medications  Medication Instructions   Accu-Chek Softclix Lancets lancets Use as instructed to check blood sugar 4 times daily   acetaminophen-caffeine (EXCEDRIN TENSION HEADACHE) 500-65 MG TABS per tablet 2 tablets, Oral, 4 times daily PRN   aspirin EC 162 mg, Oral, Daily, Swallow whole.   Blood Glucose Monitoring Suppl (ACCU-CHEK GUIDE ME) w/Device KIT 1 each, Does not apply, 4 times daily   Blood Pressure Monitor MISC For regular home bp monitoring during pregnancy   fluticasone (FLONASE) 50 MCG/ACT nasal spray 1 spray, Each Nare, Daily   furosemide (LASIX) 40  mg, Oral, Daily   glucose blood test strip Use as instructed to check blood sugar four times daily   labetalol (NORMODYNE) 600 mg, Oral, 3 times daily   NIFEdipine (PROCARDIA-XL/NIFEDICAL-XL) 30 mg, Oral, Daily   potassium chloride SA (KLOR-CON M20) 20 MEQ tablet 20 mEq, Oral, Daily   Prenatal Vit-Fe Sulfate-FA-DHA (PRENATAL VITAMIN/MIN +DHA) 27-0.8-200 MG CAPS 1 tablet, Oral, Daily     Review of Systems:   Pertinent items are noted in HPI Denies abnormal vaginal discharge w/ itching/odor/irritation, headaches, visual changes, shortness of breath, chest pain, abdominal pain, severe nausea/vomiting, or problems with urination or bowel movements unless otherwise stated above. Pertinent History Reviewed:  Reviewed past medical,surgical, social, obstetrical and family history.  Reviewed problem list, medications and allergies. Physical Assessment:   Vitals:   01/12/24 1104  BP: 135/89  Pulse: 72  Weight: 238 lb (108 kg)  Body mass index is 35.15 kg/m.           Physical Examination:   General appearance: alert, well appearing, and in no distress  Mental status: normal mood, behavior, speech, dress, motor activity, and thought processes  Skin: warm & dry   Extremities: Edema: Trace    Cardiovascular: normal heart rate noted  Respiratory: normal respiratory  effort, no distress  Abdomen: gravid, soft, non-tender  Pelvic: Cervical exam deferred         Fetal Status:     Movement: Present    Fetal Surveillance Testing today: NST  NST being performed due to Chronic HTN, GDMA1   Fetal Monitoring:  Baseline: 120 bpm, Variability: moderate, Accelerations: present, The accelerations are >15 bpm and more than 2 in 20 minutes, and Decelerations: Absent     Final diagnosis:   Reactive NST      Chaperone: N/A    Results for orders placed or performed in visit on 01/11/24 (from the past 24 hours)  Comprehensive metabolic panel   Collection Time: 01/11/24  2:48 PM  Result Value Ref  Range   Glucose 107 (H) 70 - 99 mg/dL   BUN 8 6 - 24 mg/dL   Creatinine, Ser 4.09 (L) 0.57 - 1.00 mg/dL   eGFR 811 >91 YN/WGN/5.62   BUN/Creatinine Ratio 15 9 - 23   Sodium 137 134 - 144 mmol/L   Potassium 3.9 3.5 - 5.2 mmol/L   Chloride 108 (H) 96 - 106 mmol/L   CO2 17 (L) 20 - 29 mmol/L   Calcium 9.3 8.7 - 10.2 mg/dL   Total Protein 5.7 (L) 6.0 - 8.5 g/dL   Albumin 3.4 (L) 3.9 - 4.9 g/dL   Globulin, Total 2.3 1.5 - 4.5 g/dL   Bilirubin Total 0.2 0.0 - 1.2 mg/dL   Alkaline Phosphatase 267 (H) 44 - 121 IU/L   AST 24 0 - 40 IU/L   ALT 23 0 - 32 IU/L  Magnesium   Collection Time: 01/11/24  2:48 PM  Result Value Ref Range   Magnesium 1.6 1.6 - 2.3 mg/dL     Assessment & Plan:  High-risk pregnancy: [id number] at [redacted]w[redacted]d with an Estimated Date of Delivery: 02/20/24   1. Supervision of high risk pregnancy in third trimester (Primary)   2. Chronic hypertension with exacerbation during pregnancy, antepartum, third trimester BP stable today, continue with recommendation per cardiology Recent lab work yesterday 3/6, will continue weekly Reviewed plan for IOL @ 37wk  4. History of non-ST elevation myocardial infarction (NSTEMI)   5. GDMA1 Controlled with diet   Meds: No orders of the defined types were placed in this encounter.   Labs/procedures today: NST  Treatment Plan:  routine OB care and as outlined above  Reviewed: Preterm labor symptoms and general obstetric precautions including but not limited to vaginal bleeding, contractions, leaking of fluid and fetal movement were reviewed in detail with the patient.  All questions were answered. Pt has home bp cuff. Check bp weekly, let us know if >140/90.   Follow-up: No follow-ups on file.   Future Appointments  Date Time Provider Department Center  01/16/2024  2:15 PM [facility] - FT IMG 2 [facility] None  01/16/2024  3:10 PM Lazaro Arms, MD [facility]  01/19/2024 10:50 AM [facility] NURSE [facility]  01/23/2024  1:30 PM  [facility] - FT IMG 2 [facility] None  01/23/2024  2:50 PM Myna Hidalgo, DO [facility]  01/26/2024 10:50 AM [facility] NURSE [facility]  01/26/2024  2:00 PM Cheree Ditto, RPH [facility] None  01/30/2024  1:30 PM [facility] - FT IMG 2 [facility] None  01/30/2024  2:50 PM Myna Hidalgo, DO [facility]  02/02/2024 10:50 AM [facility] NURSE [facility]  02/06/2024  1:30 PM [facility] - FT IMG 2 [facility] None  02/06/2024  2:30 PM Lazaro Arms, MD [facility]  02/09/2024 10:50  AM [facility] NURSE [facility]  02/09/2024  3:00 PM Tobb, Kardie, DO [facility] None  02/13/2024  1:30 PM [facility] - FT IMG 2 [facility] None  02/13/2024  2:30 PM Lazaro Arms, MD [facility]  02/16/2024 10:50 AM [facility] NURSE [facility]    No orders of the defined types were placed in this encounter.   Myna Hidalgo, DO Attending Obstetrician & Gynecologist, [facility] for [employer], [facility]

## 2024-01-15 ENCOUNTER — Encounter: Payer: Self-pay | Admitting: Cardiology

## 2024-01-15 ENCOUNTER — Other Ambulatory Visit: Payer: Self-pay | Admitting: Obstetrics & Gynecology

## 2024-01-15 DIAGNOSIS — O0993 Supervision of high risk pregnancy, unspecified, third trimester: Secondary | ICD-10-CM

## 2024-01-15 DIAGNOSIS — O10919 Unspecified pre-existing hypertension complicating pregnancy, unspecified trimester: Secondary | ICD-10-CM

## 2024-01-15 DIAGNOSIS — O24410 Gestational diabetes mellitus in pregnancy, diet controlled: Secondary | ICD-10-CM

## 2024-01-15 DIAGNOSIS — O09523 Supervision of elderly multigravida, third trimester: Secondary | ICD-10-CM

## 2024-01-16 ENCOUNTER — Ambulatory Visit: Payer: Medicaid Other | Admitting: Radiology

## 2024-01-16 ENCOUNTER — Ambulatory Visit: Payer: Medicaid Other | Admitting: Obstetrics & Gynecology

## 2024-01-16 VITALS — BP 150/97 | HR 75 | Wt 235.0 lb

## 2024-01-16 DIAGNOSIS — O24410 Gestational diabetes mellitus in pregnancy, diet controlled: Secondary | ICD-10-CM | POA: Diagnosis not present

## 2024-01-16 DIAGNOSIS — O0993 Supervision of high risk pregnancy, unspecified, third trimester: Secondary | ICD-10-CM

## 2024-01-16 DIAGNOSIS — O09523 Supervision of elderly multigravida, third trimester: Secondary | ICD-10-CM

## 2024-01-16 DIAGNOSIS — O24419 Gestational diabetes mellitus in pregnancy, unspecified control: Secondary | ICD-10-CM | POA: Diagnosis not present

## 2024-01-16 DIAGNOSIS — O10919 Unspecified pre-existing hypertension complicating pregnancy, unspecified trimester: Secondary | ICD-10-CM

## 2024-01-16 DIAGNOSIS — O10913 Unspecified pre-existing hypertension complicating pregnancy, third trimester: Secondary | ICD-10-CM

## 2024-01-16 DIAGNOSIS — Z3A35 35 weeks gestation of pregnancy: Secondary | ICD-10-CM | POA: Diagnosis not present

## 2024-01-16 MED ORDER — NIFEDIPINE ER OSMOTIC RELEASE 30 MG PO TB24: 30.0000 mg | ORAL_TABLET | Freq: Two times a day (BID) | ORAL | 4 refills | Status: DC

## 2024-01-16 NOTE — Progress Notes (Signed)
 HIGH-RISK PREGNANCY VISIT Patient name: Kaitlin Torres MRN [id number]  Date of birth: 26-Aug-1981 Chief Complaint:   Routine Prenatal Visit  History of Present Illness:   Kaitlin Torres is a 43 y.o. G20P3003 female at [redacted]w[redacted]d with an Estimated Date of Delivery: 02/20/24 being seen today for ongoing management of a high-risk pregnancy complicated by    ICD-10-CM   1. Supervision of high risk pregnancy in third trimester  O09.93     2. Chronic hypertension pregnancy: labetalol 600 mg TID + procardia xl 30 qd  O10.913     3. Gestational diabetes mellitus: sporacdic checks, all are good  O24.419       .    Today she reports no complaints.  .  .   . denies leaking of fluid.      08/22/2023    2:45 PM 07/06/2023    3:48 PM 12/22/2022    3:55 PM 11/01/2022    9:36 AM 05/17/2022   11:49 AM  Depression screen PHQ 2/9  Decreased Interest 0 0 0 0 0  Down, Depressed, Hopeless 0 0 0 0 0  PHQ - 2 Score 0 0 0 0 0  Altered sleeping 3 0 0 0 0  Tired, decreased energy 3 3 0 0 0  Change in appetite 0 0 0 0 0  Feeling bad or failure about yourself  0 0 0 0 0  Trouble concentrating 0 0 0 0 0  Moving slowly or fidgety/restless 0 0 0 0 0  Suicidal thoughts 0 0 0 0 0  PHQ-9 Score 6 3 0 0 0  Difficult doing work/chores   Not difficult at all Not difficult at all Not difficult at all        08/22/2023    2:45 PM 07/06/2023    3:48 PM 12/22/2022    3:56 PM 11/01/2022    9:37 AM  GAD 7 : Generalized Anxiety Score  Nervous, Anxious, on Edge 0 0 0   Control/stop worrying 0 0 0   Worry too much - different things 0 0 0   Trouble relaxing 0 0 0   Restless 0 0 1 1  Easily annoyed or irritable 0 2 0   Afraid - awful might happen 0 0 0   Total GAD 7 Score 0 2 1   Anxiety Difficulty   Not difficult at all      Review of Systems:   Pertinent items are noted in HPI Denies abnormal vaginal discharge w/ itching/odor/irritation, headaches, visual changes, shortness of breath, chest pain,  abdominal pain, severe nausea/vomiting, or problems with urination or bowel movements unless otherwise stated above. Pertinent History Reviewed:  Reviewed past medical,surgical, social, obstetrical and family history.  Reviewed problem list, medications and allergies. Physical Assessment:  There were no vitals filed for this visit.There is no height or weight on file to calculate BMI.           Physical Examination:   General appearance: alert, well appearing, and in no distress  Mental status: alert, oriented to person, place, and time  Skin: warm & dry   Extremities:      Cardiovascular: normal heart rate noted  Respiratory: normal respiratory effort, no distress  Abdomen: gravid, soft, non-tender  Pelvic: Cervical exam deferred         Fetal Status:          Fetal Surveillance Testing today: BPP 8/8 UAD 42%   Chaperone: N/A    No results  found for this or any previous visit (from the past 24 hours).  Assessment & Plan:  High-risk pregnancy: [id number] at [redacted]w[redacted]d with an Estimated Date of Delivery: 02/20/24      ICD-10-CM   1. Supervision of high risk pregnancy in third trimester  O09.93     2. Chronic hypertension pregnancy: labetalol 600 mg TID + procardia xl 30 qd  O10.913     3. Gestational diabetes mellitus: sporacdic checks, all are good  O24.419      IOL 37 weeks  Meds: No orders of the defined types were placed in this encounter.   Orders: No orders of the defined types were placed in this encounter.    Labs/procedures today: U/S  Treatment Plan:  twice weekly surveillance  Reviewed: Preterm labor symptoms and general obstetric precautions including but not limited to vaginal bleeding, contractions, leaking of fluid and fetal movement were reviewed in detail with the patient.  All questions were answered. Does have home bp cuff. Office bp cuff given: not applicable. Check bp twice daily, let us know if consistently >150 and/or >95.  Follow-up: No follow-ups on  file.   Future Appointments  Date Time Provider Department Center  01/16/2024  3:10 PM Lazaro Arms, MD [facility]  01/19/2024 10:50 AM [facility] NURSE [facility]  01/23/2024  1:30 PM [facility] - FT IMG 2 [facility] None  01/23/2024  2:50 PM Myna Hidalgo, DO [facility]  01/26/2024 10:50 AM [facility] NURSE [facility]  01/26/2024  2:00 PM Cheree Ditto, RPH [facility] None  01/30/2024  1:30 PM [facility] - FT IMG 2 [facility] None  01/30/2024  2:50 PM Myna Hidalgo, DO [facility]  02/02/2024 10:50 AM [facility] NURSE [facility]  02/06/2024  1:30 PM [facility] FT IMG 2 [facility] None  02/06/2024  2:30 PM Lazaro Arms, MD [facility]  02/09/2024 10:50 AM [facility] NURSE [facility]  02/09/2024  3:00 PM Tobb, Lavona Mound, DO [facility] None  02/13/2024  1:30 PM [facility] - FT IMG 2 [facility] None  02/13/2024  2:30 PM Lazaro Arms, MD [facility]  02/16/2024 10:50 AM [facility] NURSE [facility]    No orders of the defined types were placed in this encounter.  Lazaro Arms  Attending Physician for the Center for [facility] 01/16/2024 3:06 PM

## 2024-01-16 NOTE — Progress Notes (Signed)
 [place]:  GA = 35 weeks Single active female fetus,  cephalic,  FHR = 829 bpm, anterior pl high, gr2 AFI = 11.9 cm,  28%,  MVP = 3.7 cm,  BPP = 8/8,  RI: 0.56, 0.59   42%

## 2024-01-19 ENCOUNTER — Ambulatory Visit: Payer: Medicaid Other | Admitting: *Deleted

## 2024-01-19 VITALS — BP 134/89 | HR 69 | Wt 242.0 lb

## 2024-01-19 DIAGNOSIS — Z3A35 35 weeks gestation of pregnancy: Secondary | ICD-10-CM | POA: Diagnosis not present

## 2024-01-19 DIAGNOSIS — O0993 Supervision of high risk pregnancy, unspecified, third trimester: Secondary | ICD-10-CM

## 2024-01-19 NOTE — Progress Notes (Signed)
   NURSE VISIT- NST  SUBJECTIVE:  MARYGRACE SANDOVAL is a 43 y.o. G42P3003 female at [redacted]w[redacted]d, here for a NST for pregnancy complicated by [facility] and Diabetes: A1DM}.  She reports active fetal movement, contractions: none, vaginal bleeding: none, membranes: intact.   OBJECTIVE:  BP 134/89   Pulse 69   Wt 242 lb (109.8 kg)   LMP 05/16/2023   BMI 35.74 kg/m   Appears well, no apparent distress  No results found for this or any previous visit (from the past 24 hours).  NST: FHR baseline 130 bpm, Variability: moderate, Accelerations:present, Decelerations:  Absent= Cat 1/reactive Toco: none   ASSESSMENT: [id number] at [redacted]w[redacted]d with CHTN and Diabetes: A1DM} NST reactive  PLAN: EFM strip reviewed by Dr. Despina Hidden   Recommendations: keep next appointment as scheduled    Annamarie Dawley  01/19/2024 11:48 AM

## 2024-01-21 ENCOUNTER — Encounter (HOSPITAL_COMMUNITY): Payer: Self-pay | Admitting: Emergency Medicine

## 2024-01-21 ENCOUNTER — Emergency Department (HOSPITAL_COMMUNITY)
Admission: EM | Admit: 2024-01-21 | Discharge: 2024-01-21 | Disposition: A | Discharge status: Home / Self Care | Attending: Emergency Medicine | Admitting: Emergency Medicine

## 2024-01-21 ENCOUNTER — Other Ambulatory Visit: Payer: Self-pay

## 2024-01-21 DIAGNOSIS — I10 Essential (primary) hypertension: Secondary | ICD-10-CM | POA: Diagnosis not present

## 2024-01-21 DIAGNOSIS — O99513 Diseases of the respiratory system complicating pregnancy, third trimester: Secondary | ICD-10-CM | POA: Insufficient documentation

## 2024-01-21 DIAGNOSIS — O133 Gestational [pregnancy-induced] hypertension without significant proteinuria, third trimester: Secondary | ICD-10-CM | POA: Diagnosis not present

## 2024-01-21 DIAGNOSIS — J069 Acute upper respiratory infection, unspecified: Secondary | ICD-10-CM | POA: Insufficient documentation

## 2024-01-21 DIAGNOSIS — Z3A36 36 weeks gestation of pregnancy: Secondary | ICD-10-CM | POA: Insufficient documentation

## 2024-01-21 DIAGNOSIS — B9789 Other viral agents as the cause of diseases classified elsewhere: Secondary | ICD-10-CM | POA: Diagnosis not present

## 2024-01-21 DIAGNOSIS — O24419 Gestational diabetes mellitus in pregnancy, unspecified control: Secondary | ICD-10-CM | POA: Diagnosis not present

## 2024-01-21 DIAGNOSIS — Z7982 Long term (current) use of aspirin: Secondary | ICD-10-CM | POA: Diagnosis not present

## 2024-01-21 LAB — RESP PANEL BY RT-PCR (RSV, FLU A&B, COVID)  RVPGX2
Influenza A by PCR: NEGATIVE
Influenza B by PCR: NEGATIVE
Resp Syncytial Virus by PCR: NEGATIVE
SARS Coronavirus 2 by RT PCR: NEGATIVE

## 2024-01-21 NOTE — Discharge Instructions (Signed)
 Your testing is negative for COVID flu and RSV.  This sounds like a virus, you can follow-up with your doctor as needed but be aware that you may be sick for the next several days with runny nose congestion headache cough and sore throat.  You are contagious to others and should try to keep your distance from other people as it is contagious to others.  Please let your OB/GYN know

## 2024-01-21 NOTE — ED Triage Notes (Signed)
 Pt reports she woke up feeling terrible. She his [redacted] weeks pregnant. Pt currently has hypertension, gestational diabetes and a past RSV infection. Pt reports runny nose, congestion, non-productive cough, sob, diarrhea, chills, weakness starting this morning.

## 2024-01-21 NOTE — ED Provider Notes (Signed)
 [place] EMERGENCY DEPARTMENT AT [facility] Provider Note   CSN: [id number] Arrival date & time: 01/21/24  1719     History  Chief Complaint  Patient presents with   flu like symptoms    Kaitlin Torres is a 42 y.o. female.  HPI    36-week  pregnant female at 43 years old presenting with 1 day of runny nose sore throat mild cough and feeling subjective fevers.  Has had exposure to flu recently, no nausea vomiting or diarrhea, no abdominal pain, not short of breath  Home Medications Prior to Admission medications   Medication Sig Start Date End Date Taking? Authorizing Provider  Accu-Chek Softclix Lancets lancets Use as instructed to check blood sugar 4 times daily 11/23/23   Myna Hidalgo, DO  acetaminophen-caffeine (EXCEDRIN TENSION HEADACHE) 500-65 MG TABS per tablet Take 2 tablets by mouth 4 (four) times daily as needed (Foe headaches). 01/09/24   Colman Cater, NP  aspirin EC 81 MG tablet Take 2 tablets (162 mg total) by mouth daily. Swallow whole. 08/22/23   Cheral Marker, CNM  Blood Glucose Monitoring Suppl (ACCU-CHEK GUIDE ME) w/Device KIT 1 each by Does not apply route 4 (four) times daily. 11/23/23   Myna Hidalgo, DO  Blood Pressure Monitor MISC For regular home bp monitoring during pregnancy 08/22/23   Cheral Marker, CNM  fluticasone [facility]) 50 MCG/ACT nasal spray Place 1 spray into both nostrils daily. 12/26/23   Cheral Marker, CNM  furosemide (LASIX) 40 MG tablet Take 1 tablet (40 mg total) by mouth daily for 3 days. Patient not taking: Reported on 01/12/2024 01/11/24 01/14/24  Thomasene Ripple, DO  glucose blood test strip Use as instructed to check blood sugar four times daily 11/23/23   Myna Hidalgo, DO  labetalol (NORMODYNE) 200 MG tablet Take 3 tablets (600 mg total) by mouth 3 (three) times daily. 01/09/24   Cooleen, Wilmer Floor, NP  NIFEdipine (PROCARDIA-XL/NIFEDICAL-XL) 30 MG 24 hr tablet Take 1 tablet (30 mg total) by mouth 2 (two) times daily.  01/16/24   Lazaro Arms, MD  potassium chloride SA (KLOR-CON M20) 20 MEQ tablet Take 1 tablet (20 mEq total) by mouth daily for 3 days. Patient not taking: Reported on 01/12/2024 01/11/24 01/14/24  Thomasene Ripple, DO  Prenatal Vit-Fe Sulfate-FA-DHA (PRENATAL VITAMIN/MIN +DHA) 27-0.8-200 MG CAPS Take 1 tablet by mouth daily. 07/06/23   Lazaro Arms, MD      Allergies    Diclofenac    Review of Systems   Review of Systems  All other systems reviewed and are negative.   Physical Exam Updated Vital Signs BP 126/82 (BP Location: Right Arm)   Pulse 79   Temp 98.4 F (36.9 C) (Oral)   Resp 20   Ht 1.753 m (5\' 9" )   Wt 109.8 kg   LMP 05/16/2023   SpO2 99%   BMI 35.74 kg/m  Physical Exam Vitals and nursing note reviewed.  Constitutional:      General: She is not in acute distress.    Appearance: She is well-developed.  HENT:     Head: Normocephalic and atraumatic.     Nose: Congestion and rhinorrhea present.     Comments:  swollen turbinates, clear rhinorrhea bilaterally, clear oropharynx    Mouth/Throat:     Pharynx: No oropharyngeal exudate.  Eyes:     General: No scleral icterus.       Right eye: No discharge.        Left  eye: No discharge.     Conjunctiva/sclera: Conjunctivae normal.     Pupils: Pupils are equal, round, and reactive to light.  Neck:     Thyroid: No thyromegaly.     Vascular: No JVD.  Cardiovascular:     Rate and Rhythm: Normal rate and regular rhythm.     Heart sounds: Normal heart sounds. No murmur heard.    No friction rub. No gallop.  Pulmonary:     Effort: Pulmonary effort is normal. No respiratory distress.     Breath sounds: Normal breath sounds. No wheezing or rales.  Abdominal:     General: Bowel sounds are normal. There is no distension.     Palpations: Abdomen is soft. There is no mass.     Tenderness: There is no abdominal tenderness.  Musculoskeletal:        General: No tenderness. Normal range of motion.     Cervical back: Normal range of  motion and neck supple.     Right lower leg: No edema.     Left lower leg: No edema.  Lymphadenopathy:     Cervical: No cervical adenopathy.  Skin:    General: Skin is warm and dry.     Findings: No erythema or rash.  Neurological:     General: No focal deficit present.     Mental Status: She is alert.     Coordination: Coordination normal.  Psychiatric:        Behavior: Behavior normal.     ED Results / Procedures / Treatments   Labs (all labs ordered are listed, but only abnormal results are displayed) Labs Reviewed  RESP PANEL BY RT-PCR (RSV, FLU A&B, COVID)  [id number]    EKG EKG Interpretation Date/Time:  Sunday January 21 2024 17:34:48 EDT Ventricular Rate:  78 PR Interval:  126 QRS Duration:  90 QT Interval:  396 QTC Calculation: 451 R Axis:   -11  Text Interpretation: Normal sinus rhythm Nonspecific ST abnormality Abnormal ECG When compared with ECG of 11-Sep-2023 14:59, No significant change was found Confirmed by Eber Hong ([id number]) on 01/21/2024 5:44:24 PM  Radiology No results found.  Procedures Procedures    Medications Ordered in ED Medications - No data to display  ED Course/ Medical Decision Making/ A&P                                 Medical Decision Making   this patient's exam is actually very unremarkable, she is afebrile, she is normotensive, she has a heart rate of 79 respirations of 20 and a sat of 99%.  She does not have a fever, she is not hypo-hypo as of, she has had 1 day of upper tori symptoms and at this time appears very well. she is concerned about the fluid she has had family members with flu exposure  Flu COVID and RSV negative, vitals normal, stable for discharge, 1 day of mild viral upper respiratory symptoms        Final Clinical Impression(s) / ED Diagnoses Final diagnoses:  Viral upper respiratory illness    Rx / DC Orders ED Discharge Orders     None         Eber Hong, MD 01/21/24 [phone]

## 2024-01-22 ENCOUNTER — Other Ambulatory Visit: Payer: Self-pay | Admitting: Obstetrics & Gynecology

## 2024-01-22 DIAGNOSIS — O0993 Supervision of high risk pregnancy, unspecified, third trimester: Secondary | ICD-10-CM

## 2024-01-22 DIAGNOSIS — O24410 Gestational diabetes mellitus in pregnancy, diet controlled: Secondary | ICD-10-CM

## 2024-01-22 DIAGNOSIS — O10919 Unspecified pre-existing hypertension complicating pregnancy, unspecified trimester: Secondary | ICD-10-CM

## 2024-01-22 DIAGNOSIS — O09523 Supervision of elderly multigravida, third trimester: Secondary | ICD-10-CM

## 2024-01-23 ENCOUNTER — Ambulatory Visit (INDEPENDENT_AMBULATORY_CARE_PROVIDER_SITE_OTHER): Payer: Medicaid Other | Admitting: Radiology

## 2024-01-23 ENCOUNTER — Telehealth (HOSPITAL_COMMUNITY): Payer: Self-pay | Admitting: *Deleted

## 2024-01-23 ENCOUNTER — Encounter (HOSPITAL_COMMUNITY): Payer: Self-pay | Admitting: *Deleted

## 2024-01-23 ENCOUNTER — Other Ambulatory Visit (HOSPITAL_COMMUNITY)
Admission: RE | Admit: 2024-01-23 | Discharge: 2024-01-23 | Disposition: A | Discharge status: Home / Self Care | Source: Ambulatory Visit | Attending: Obstetrics & Gynecology | Admitting: Obstetrics & Gynecology

## 2024-01-23 ENCOUNTER — Ambulatory Visit (INDEPENDENT_AMBULATORY_CARE_PROVIDER_SITE_OTHER): Payer: Medicaid Other | Admitting: Obstetrics & Gynecology

## 2024-01-23 ENCOUNTER — Encounter: Payer: Self-pay | Admitting: Obstetrics & Gynecology

## 2024-01-23 VITALS — BP 134/86 | HR 75 | Wt 243.4 lb

## 2024-01-23 DIAGNOSIS — O10919 Unspecified pre-existing hypertension complicating pregnancy, unspecified trimester: Secondary | ICD-10-CM

## 2024-01-23 DIAGNOSIS — Z3A36 36 weeks gestation of pregnancy: Secondary | ICD-10-CM | POA: Diagnosis not present

## 2024-01-23 DIAGNOSIS — O24410 Gestational diabetes mellitus in pregnancy, diet controlled: Secondary | ICD-10-CM

## 2024-01-23 DIAGNOSIS — O0992 Supervision of high risk pregnancy, unspecified, second trimester: Secondary | ICD-10-CM

## 2024-01-23 DIAGNOSIS — O24419 Gestational diabetes mellitus in pregnancy, unspecified control: Secondary | ICD-10-CM

## 2024-01-23 DIAGNOSIS — I252 Old myocardial infarction: Secondary | ICD-10-CM | POA: Diagnosis not present

## 2024-01-23 DIAGNOSIS — O09523 Supervision of elderly multigravida, third trimester: Secondary | ICD-10-CM | POA: Diagnosis not present

## 2024-01-23 DIAGNOSIS — O10913 Unspecified pre-existing hypertension complicating pregnancy, third trimester: Secondary | ICD-10-CM | POA: Diagnosis not present

## 2024-01-23 DIAGNOSIS — O0993 Supervision of high risk pregnancy, unspecified, third trimester: Secondary | ICD-10-CM

## 2024-01-23 NOTE — Progress Notes (Signed)
 [place]: GA = 36 weeks Single active female fetus, cephalic, FHR = 138 bpm  anterior pl, gr1 AFI = 8.6 cm, 8% MVP = 4.5 cm  BPP = 8/8,  EFW 27%  AC 10.9%  RI: 0.54, 0.65 56%

## 2024-01-23 NOTE — Telephone Encounter (Signed)
 Preadmission screen

## 2024-01-23 NOTE — Progress Notes (Addendum)
 HIGH-RISK PREGNANCY VISIT Patient name: Kaitlin Torres MRN [id number]  Date of birth: 05-03-1981 Chief Complaint:   Routine Prenatal Visit  History of Present Illness:   Kaitlin Torres is a 43 y.o. G70P3003 female at [redacted]w[redacted]d with an Estimated Date of Delivery: 02/20/24 being seen today for ongoing management of a high-risk pregnancy complicated by:  -Chronic HTN Currently stable- medication increased throughout pregnancy  -GDMA1 per pt stable -h/o MI 2020, s/p OB cardiology  Today she reports  seen at [facility]- diagnosed with viral URI .   Contractions: Not present. Vag. Bleeding: None.  Movement: Present. denies leaking of fluid.      08/22/2023    2:45 PM 07/06/2023    3:48 PM 12/22/2022    3:55 PM 11/01/2022    9:36 AM 05/17/2022   11:49 AM  Depression screen PHQ 2/9  Decreased Interest 0 0 0 0 0  Down, Depressed, Hopeless 0 0 0 0 0  PHQ - 2 Score 0 0 0 0 0  Altered sleeping 3 0 0 0 0  Tired, decreased energy 3 3 0 0 0  Change in appetite 0 0 0 0 0  Feeling bad or failure about yourself  0 0 0 0 0  Trouble concentrating 0 0 0 0 0  Moving slowly or fidgety/restless 0 0 0 0 0  Suicidal thoughts 0 0 0 0 0  PHQ-9 Score 6 3 0 0 0  Difficult doing work/chores   Not difficult at all Not difficult at all Not difficult at all     Current Outpatient Medications  Medication Instructions   Accu-Chek Softclix Lancets lancets Use as instructed to check blood sugar 4 times daily   acetaminophen-caffeine (EXCEDRIN TENSION HEADACHE) 500-65 MG TABS per tablet 2 tablets, Oral, 4 times daily PRN   aspirin EC 162 mg, Oral, Daily, Swallow whole.   Blood Glucose Monitoring Suppl (ACCU-CHEK GUIDE ME) w/Device KIT 1 each, Does not apply, 4 times daily   Blood Pressure Monitor MISC For regular home bp monitoring during pregnancy   fluticasone (FLONASE) 50 MCG/ACT nasal spray 1 spray, Each Nare, Daily   furosemide (LASIX) 40 mg, Oral, Daily   glucose blood test strip Use as instructed to check  blood sugar four times daily   labetalol (NORMODYNE) 600 mg, Oral, 3 times daily   NIFEdipine (PROCARDIA-XL/NIFEDICAL-XL) 30 mg, Oral, 2 times daily   potassium chloride SA (KLOR-CON M20) 20 MEQ tablet 20 mEq, Oral, Daily   Prenatal Vit-Fe Sulfate-FA-DHA (PRENATAL VITAMIN/MIN +DHA) 27-0.8-200 MG CAPS 1 tablet, Oral, Daily     Review of Systems:   Pertinent items are noted in HPI Denies abnormal vaginal discharge w/ itching/odor/irritation, headaches, visual changes, shortness of breath, chest pain, abdominal pain, severe nausea/vomiting, or problems with urination or bowel movements unless otherwise stated above. Pertinent History Reviewed:  Reviewed past medical,surgical, social, obstetrical and family history.  Reviewed problem list, medications and allergies. Physical Assessment:   Vitals:   01/23/24 1403  BP: 134/86  Pulse: 75  Weight: 243 lb 6.4 oz (110.4 kg)  Body mass index is 35.94 kg/m.           Physical Examination:   General appearance: alert, well appearing, and in no distress  Mental status: normal mood, behavior, speech, dress, motor activity, and thought processes  Skin: warm & dry   Extremities: Edema: None    Cardiovascular: normal heart rate noted  Respiratory: normal respiratory effort, no distress  Abdomen: gravid, soft, non-tender  Pelvic: Cervical  exam performed  Dilation: Closed Effacement (%): 20 Station: -3 Vaginal swabs obtained  Fetal Status:     Movement: Present    Fetal Surveillance Testing today:   [place]: GA = 36 weeks Single active female fetus, cephalic, FHR = 138 bpm  anterior pl, gr1 AFI = 8.6 cm, 8% MVP = 4.5 cm  BPP = 8/8,  EFW 27%  AC 10.9%  RI: 0.54, 0.65 56%     Chaperone: Latisha Cresenzo    No results found for this or any previous visit (from the past 24 hours).   Assessment & Plan:  High-risk pregnancy: G4P3003 at [redacted]w[redacted]d with an Estimated Date of Delivery: 02/20/24   1. [redacted] weeks gestation of pregnancy  - Culture, beta strep  (group b only) - Cervicovaginal ancillary only  2. Gestational diabetes mellitus: sporacdic checks, all are good -stable -growth scan today- 2585  grams, 27 %, AC 10.9%   3. Supervision of high risk pregnancy in third trimester (Primary)  - Culture, beta strep (group b only) - Cervicovaginal ancillary only  -scheduled for IOL midnight 3.25  4. Chronic HTN, History of non-ST elevation myocardial infarction (NSTEMI) -Currently on labetalol 600 mg 3 times daily, Procardia 30 mg daily -Followed by cardiology has an appointment on 321 -BPP 8/8 -continue with antepartum testing  5. Vape/tobacco use  Meds: No orders of the defined types were placed in this encounter.   Labs/procedures today: BPP, GBS, GC/C  Treatment Plan:  as outlined above  Reviewed: Preterm labor symptoms and general obstetric precautions including but not limited to vaginal bleeding, contractions, leaking of fluid and fetal movement were reviewed in detail with the patient.  All questions were answered. Pt has home bp cuff. Check bp weekly, let us know if >160/110.   Follow-up: Return for twice weekly as scheduled, IOL scheduled 3/25.   Future Appointments  Date Time Provider Department Center  01/23/2024  2:50 PM Myna Hidalgo, DO [facility]  01/26/2024 10:50 AM [facility] NURSE [facility]  01/26/2024  2:00 PM Cheree Ditto, [facility] None  01/30/2024 12:00 AM [facility] SCHED ROOM [facility] None  02/09/2024  3:00 PM Tobb, Lavona Mound, DO [facility] None    Orders Placed This Encounter  Procedures   Culture, beta strep (group b only)    Myna Hidalgo, DO Attending Obstetrician & Gynecologist, Faculty Practice Center for [employer], [facility]

## 2024-01-24 ENCOUNTER — Other Ambulatory Visit: Payer: Self-pay | Admitting: Advanced Practice Midwife

## 2024-01-24 DIAGNOSIS — Z3A36 36 weeks gestation of pregnancy: Secondary | ICD-10-CM | POA: Diagnosis not present

## 2024-01-24 DIAGNOSIS — O0993 Supervision of high risk pregnancy, unspecified, third trimester: Secondary | ICD-10-CM | POA: Diagnosis not present

## 2024-01-25 ENCOUNTER — Encounter: Payer: Self-pay | Admitting: Obstetrics & Gynecology

## 2024-01-25 LAB — CERVICOVAGINAL ANCILLARY ONLY
Chlamydia: NEGATIVE
Comment: NEGATIVE
Comment: NORMAL
Neisseria Gonorrhea: NEGATIVE

## 2024-01-26 ENCOUNTER — Ambulatory Visit: Payer: Medicaid Other | Admitting: *Deleted

## 2024-01-26 ENCOUNTER — Ambulatory Visit: Admitting: Pharmacist

## 2024-01-26 ENCOUNTER — Telehealth: Payer: Self-pay | Admitting: Cardiology

## 2024-01-26 VITALS — BP 153/97 | HR 93 | Wt 243.0 lb

## 2024-01-26 DIAGNOSIS — O24419 Gestational diabetes mellitus in pregnancy, unspecified control: Secondary | ICD-10-CM

## 2024-01-26 DIAGNOSIS — Z3A36 36 weeks gestation of pregnancy: Secondary | ICD-10-CM

## 2024-01-26 DIAGNOSIS — O0993 Supervision of high risk pregnancy, unspecified, third trimester: Secondary | ICD-10-CM

## 2024-01-26 DIAGNOSIS — O10913 Unspecified pre-existing hypertension complicating pregnancy, third trimester: Secondary | ICD-10-CM

## 2024-01-26 DIAGNOSIS — O10919 Unspecified pre-existing hypertension complicating pregnancy, unspecified trimester: Secondary | ICD-10-CM

## 2024-01-26 NOTE — Progress Notes (Signed)
   NURSE VISIT- NST  SUBJECTIVE:  Kaitlin Torres is a 43 y.o. G85P3003 female at [redacted]w[redacted]d, here for a NST for pregnancy complicated by [facility] and Diabetes: A1DM}.  She reports active fetal movement, contractions: none, vaginal bleeding: none, membranes: intact.   OBJECTIVE:  BP (!) 153/97   Pulse 93   Wt 243 lb (110.2 kg)   LMP 05/16/2023   BMI 35.88 kg/m   Appears well, no apparent distress  No results found for this or any previous visit (from the past 24 hours).  NST: FHR baseline 135 bpm, Variability: moderate, Accelerations:present, Decelerations:  Absent= Cat 1/reactive Toco: none   ASSESSMENT: [id number] at [redacted]w[redacted]d with CHTN and Diabetes: A1DM} NST reactive  PLAN: EFM strip reviewed by Dr. Despina Hidden   Recommendations: keep next appointment as scheduled    Jobe Marker  01/26/2024 12:42 PM

## 2024-01-26 NOTE — Telephone Encounter (Signed)
 Called patient back. Being induced on Monday so will not reschedule appt right now. Has f/u scheduled with Dr Servando Salina on 4/4. Will see patient after if needed

## 2024-01-26 NOTE — Telephone Encounter (Signed)
 Pt is requesting a callback regarding her wanting to know if she should r/s her appt she had to cancel with you or not. She'd being induced on Monday so she's unsure if she needed to be seen after having the baby. Please advise

## 2024-01-28 LAB — CULTURE, BETA STREP (GROUP B ONLY): Strep Gp B Culture: NEGATIVE

## 2024-01-30 ENCOUNTER — Encounter: Payer: Medicaid Other | Admitting: Obstetrics & Gynecology

## 2024-01-30 ENCOUNTER — Inpatient Hospital Stay (HOSPITAL_COMMUNITY)
Admission: RE | Admit: 2024-01-30 | Discharge: 2024-02-02 | DRG: 807 | Disposition: A | Discharge status: Home / Self Care | Attending: Obstetrics and Gynecology | Admitting: Obstetrics and Gynecology

## 2024-01-30 ENCOUNTER — Inpatient Hospital Stay (HOSPITAL_COMMUNITY): Admitting: Anesthesiology

## 2024-01-30 ENCOUNTER — Other Ambulatory Visit: Payer: Medicaid Other | Admitting: Radiology

## 2024-01-30 ENCOUNTER — Encounter (HOSPITAL_COMMUNITY): Payer: Self-pay | Admitting: Obstetrics & Gynecology

## 2024-01-30 ENCOUNTER — Other Ambulatory Visit: Payer: Self-pay

## 2024-01-30 ENCOUNTER — Inpatient Hospital Stay (HOSPITAL_COMMUNITY)

## 2024-01-30 DIAGNOSIS — O09523 Supervision of elderly multigravida, third trimester: Secondary | ICD-10-CM | POA: Diagnosis not present

## 2024-01-30 DIAGNOSIS — Z79899 Other long term (current) drug therapy: Secondary | ICD-10-CM

## 2024-01-30 DIAGNOSIS — Z833 Family history of diabetes mellitus: Secondary | ICD-10-CM

## 2024-01-30 DIAGNOSIS — K219 Gastro-esophageal reflux disease without esophagitis: Secondary | ICD-10-CM | POA: Diagnosis not present

## 2024-01-30 DIAGNOSIS — Z955 Presence of coronary angioplasty implant and graft: Secondary | ICD-10-CM

## 2024-01-30 DIAGNOSIS — Z7982 Long term (current) use of aspirin: Secondary | ICD-10-CM | POA: Diagnosis not present

## 2024-01-30 DIAGNOSIS — O24420 Gestational diabetes mellitus in childbirth, diet controlled: Secondary | ICD-10-CM | POA: Diagnosis not present

## 2024-01-30 DIAGNOSIS — Z1152 Encounter for screening for COVID-19: Secondary | ICD-10-CM

## 2024-01-30 DIAGNOSIS — O24424 Gestational diabetes mellitus in childbirth, insulin controlled: Secondary | ICD-10-CM | POA: Diagnosis not present

## 2024-01-30 DIAGNOSIS — O1410 Severe pre-eclampsia, unspecified trimester: Secondary | ICD-10-CM | POA: Insufficient documentation

## 2024-01-30 DIAGNOSIS — Z8249 Family history of ischemic heart disease and other diseases of the circulatory system: Secondary | ICD-10-CM | POA: Diagnosis not present

## 2024-01-30 DIAGNOSIS — Z148 Genetic carrier of other disease: Secondary | ICD-10-CM

## 2024-01-30 DIAGNOSIS — Z87891 Personal history of nicotine dependence: Secondary | ICD-10-CM | POA: Diagnosis not present

## 2024-01-30 DIAGNOSIS — O114 Pre-existing hypertension with pre-eclampsia, complicating childbirth: Principal | ICD-10-CM | POA: Diagnosis present

## 2024-01-30 DIAGNOSIS — I252 Old myocardial infarction: Secondary | ICD-10-CM

## 2024-01-30 DIAGNOSIS — Z3A37 37 weeks gestation of pregnancy: Secondary | ICD-10-CM | POA: Diagnosis not present

## 2024-01-30 DIAGNOSIS — Z20822 Contact with and (suspected) exposure to covid-19: Secondary | ICD-10-CM | POA: Diagnosis not present

## 2024-01-30 DIAGNOSIS — O9962 Diseases of the digestive system complicating childbirth: Secondary | ICD-10-CM | POA: Diagnosis not present

## 2024-01-30 DIAGNOSIS — Z8632 Personal history of gestational diabetes: Secondary | ICD-10-CM | POA: Diagnosis present

## 2024-01-30 DIAGNOSIS — O9902 Anemia complicating childbirth: Secondary | ICD-10-CM | POA: Diagnosis not present

## 2024-01-30 DIAGNOSIS — I2510 Atherosclerotic heart disease of native coronary artery without angina pectoris: Secondary | ICD-10-CM | POA: Diagnosis not present

## 2024-01-30 DIAGNOSIS — O1092 Unspecified pre-existing hypertension complicating childbirth: Secondary | ICD-10-CM | POA: Diagnosis present

## 2024-01-30 DIAGNOSIS — O1414 Severe pre-eclampsia complicating childbirth: Secondary | ICD-10-CM | POA: Diagnosis not present

## 2024-01-30 DIAGNOSIS — O99214 Obesity complicating childbirth: Secondary | ICD-10-CM | POA: Diagnosis not present

## 2024-01-30 DIAGNOSIS — O10913 Unspecified pre-existing hypertension complicating pregnancy, third trimester: Secondary | ICD-10-CM

## 2024-01-30 DIAGNOSIS — O10919 Unspecified pre-existing hypertension complicating pregnancy, unspecified trimester: Secondary | ICD-10-CM | POA: Diagnosis present

## 2024-01-30 DIAGNOSIS — I10 Essential (primary) hypertension: Principal | ICD-10-CM | POA: Diagnosis present

## 2024-01-30 DIAGNOSIS — O24419 Gestational diabetes mellitus in pregnancy, unspecified control: Secondary | ICD-10-CM | POA: Diagnosis present

## 2024-01-30 DIAGNOSIS — O0993 Supervision of high risk pregnancy, unspecified, third trimester: Secondary | ICD-10-CM

## 2024-01-30 LAB — COMPREHENSIVE METABOLIC PANEL
ALT: 33 U/L (ref 0–44)
ALT: 36 U/L (ref 0–44)
ALT: 31 U/L (ref 0–44)
AST: 40 U/L (ref 15–41)
AST: 43 U/L — ABNORMAL HIGH (ref 15–41)
AST: 34 U/L (ref 15–41)
Albumin: 3 g/dL — ABNORMAL LOW (ref 3.5–5.0)
Albumin: 3 g/dL — ABNORMAL LOW (ref 3.5–5.0)
Albumin: 2.9 g/dL — ABNORMAL LOW (ref 3.5–5.0)
Alkaline Phosphatase: 246 U/L — ABNORMAL HIGH (ref 38–126)
Alkaline Phosphatase: 289 U/L — ABNORMAL HIGH (ref 38–126)
Alkaline Phosphatase: 292 U/L — ABNORMAL HIGH (ref 38–126)
Anion gap: 8 (ref 5–15)
Anion gap: 8 (ref 5–15)
Anion gap: 8 (ref 5–15)
BUN: 12 mg/dL (ref 6–20)
BUN: 10 mg/dL (ref 6–20)
BUN: 10 mg/dL (ref 6–20)
CO2: 18 mmol/L — ABNORMAL LOW (ref 22–32)
CO2: 19 mmol/L — ABNORMAL LOW (ref 22–32)
CO2: 18 mmol/L — ABNORMAL LOW (ref 22–32)
Calcium: 9.3 mg/dL (ref 8.9–10.3)
Calcium: 8.9 mg/dL (ref 8.9–10.3)
Calcium: 8.6 mg/dL — ABNORMAL LOW (ref 8.9–10.3)
Chloride: 109 mmol/L (ref 98–111)
Chloride: 107 mmol/L (ref 98–111)
Chloride: 106 mmol/L (ref 98–111)
Creatinine, Ser: 0.61 mg/dL (ref 0.44–1.00)
Creatinine, Ser: 0.67 mg/dL (ref 0.44–1.00)
Creatinine, Ser: 0.8 mg/dL (ref 0.44–1.00)
GFR, Estimated: >60 mL/min (ref >60)
GFR, Estimated: >60 mL/min (ref >60)
GFR, Estimated: >60 mL/min (ref >60)
Glucose, Bld: 89 mg/dL (ref 70–99)
Glucose, Bld: 93 mg/dL (ref 70–99)
Glucose, Bld: 114 mg/dL — ABNORMAL HIGH (ref 70–99)
Potassium: 3.6 mmol/L (ref 3.5–5.1)
Potassium: 3.9 mmol/L (ref 3.5–5.1)
Potassium: 4 mmol/L (ref 3.5–5.1)
Sodium: 135 mmol/L (ref 135–145)
Sodium: 134 mmol/L — ABNORMAL LOW (ref 135–145)
Sodium: 132 mmol/L — ABNORMAL LOW (ref 135–145)
Total Bilirubin: 0.3 mg/dL (ref 0.0–1.2)
Total Bilirubin: 0.4 mg/dL (ref 0.0–1.2)
Total Bilirubin: 0.5 mg/dL (ref 0.0–1.2)
Total Protein: 6.4 g/dL — ABNORMAL LOW (ref 6.5–8.1)
Total Protein: 6.6 g/dL (ref 6.5–8.1)
Total Protein: 6.4 g/dL — ABNORMAL LOW (ref 6.5–8.1)

## 2024-01-30 LAB — PROTEIN / CREATININE RATIO, URINE
Creatinine, Urine: 116 mg/dL
Protein Creatinine Ratio: 0.28 mg/mg{creat} — ABNORMAL HIGH (ref 0.00–0.15)
Total Protein, Urine: 32 mg/dL

## 2024-01-30 LAB — CBC
HCT: 40.6 % (ref 36.0–46.0)
HCT: 43.9 % (ref 36.0–46.0)
HCT: 46.7 % — ABNORMAL HIGH (ref 36.0–46.0)
Hemoglobin: 13.7 g/dL (ref 12.0–15.0)
Hemoglobin: 14.4 g/dL (ref 12.0–15.0)
Hemoglobin: 15.3 g/dL — ABNORMAL HIGH (ref 12.0–15.0)
MCH: 24.8 pg — ABNORMAL LOW (ref 26.0–34.0)
MCH: 25.1 pg — ABNORMAL LOW (ref 26.0–34.0)
MCH: 25.5 pg — ABNORMAL LOW (ref 26.0–34.0)
MCHC: 32.8 g/dL (ref 30.0–36.0)
MCHC: 32.8 g/dL (ref 30.0–36.0)
MCHC: 33.7 g/dL (ref 30.0–36.0)
MCV: 75.5 fL — ABNORMAL LOW (ref 80.0–100.0)
MCV: 75.6 fL — ABNORMAL LOW (ref 80.0–100.0)
MCV: 76.6 fL — ABNORMAL LOW (ref 80.0–100.0)
Platelets: 244 10*3/uL (ref 150–400)
Platelets: 261 10*3/uL (ref 150–400)
Platelets: 272 10*3/uL (ref 150–400)
RBC: 5.38 MIL/uL — ABNORMAL HIGH (ref 3.87–5.11)
RBC: 5.81 MIL/uL — ABNORMAL HIGH (ref 3.87–5.11)
RBC: 6.1 MIL/uL — ABNORMAL HIGH (ref 3.87–5.11)
RDW: 17.9 % — ABNORMAL HIGH (ref 11.5–15.5)
RDW: 18 % — ABNORMAL HIGH (ref 11.5–15.5)
RDW: 18.7 % — ABNORMAL HIGH (ref 11.5–15.5)
WBC: 11.7 10*3/uL — ABNORMAL HIGH (ref 4.0–10.5)
WBC: 12.7 10*3/uL — ABNORMAL HIGH (ref 4.0–10.5)
WBC: 17.2 10*3/uL — ABNORMAL HIGH (ref 4.0–10.5)
nRBC: 0 % (ref 0.0–0.2)
nRBC: 0 % (ref 0.0–0.2)
nRBC: 0 % (ref 0.0–0.2)

## 2024-01-30 LAB — GLUCOSE, CAPILLARY
Glucose-Capillary: 77 mg/dL (ref 70–99)
Glucose-Capillary: 109 mg/dL — ABNORMAL HIGH (ref 70–99)
Glucose-Capillary: 83 mg/dL (ref 70–99)
Glucose-Capillary: 84 mg/dL (ref 70–99)
Glucose-Capillary: 92 mg/dL (ref 70–99)

## 2024-01-30 LAB — SARS CORONAVIRUS 2 BY RT PCR: SARS Coronavirus 2 by RT PCR: NEGATIVE

## 2024-01-30 LAB — MAGNESIUM: Magnesium: 4.9 mg/dL — ABNORMAL HIGH (ref 1.7–2.4)

## 2024-01-30 LAB — RPR: RPR Ser Ql: NONREACTIVE

## 2024-01-30 LAB — ABO/RH: ABO/RH(D): A POS

## 2024-01-30 MED ORDER — OXYCODONE-ACETAMINOPHEN 5-325 MG PO TABS: 2.0000 | ORAL_TABLET | ORAL | Status: DC | PRN

## 2024-01-30 MED ORDER — LACTATED RINGERS IV SOLN: INTRAVENOUS | Status: AC

## 2024-01-30 MED ORDER — HYDRALAZINE HCL 20 MG/ML IJ SOLN: 5.0000 mg | INTRAMUSCULAR | Status: DC | PRN

## 2024-01-30 MED ORDER — PHENYLEPHRINE 80 MCG/ML (10ML) SYRINGE FOR IV PUSH (FOR BLOOD PRESSURE SUPPORT): 80.0000 ug | PREFILLED_SYRINGE | INTRAVENOUS | Status: DC | PRN

## 2024-01-30 MED ORDER — ONDANSETRON HCL 4 MG/2ML IJ SOLN
4.0000 mg | Freq: Four times a day (QID) | INTRAMUSCULAR | Status: DC | PRN
  Administered 2024-01-31: 4 mg via INTRAVENOUS
  Filled 2024-01-30: qty 2

## 2024-01-30 MED ORDER — MAGNESIUM SULFATE 40 GM/1000ML IV SOLN
2.0000 g/h | INTRAVENOUS | Status: DC
  Administered 2024-01-30 – 2024-01-31 (×2): 2 g/h via INTRAVENOUS
  Filled 2024-01-30 (×2): qty 1000

## 2024-01-30 MED ORDER — EPHEDRINE 5 MG/ML INJ
10.0000 mg | INTRAVENOUS | Status: DC | PRN
  Filled 2024-01-30: qty 5

## 2024-01-30 MED ORDER — NIFEDIPINE ER OSMOTIC RELEASE 30 MG PO TB24
30.0000 mg | ORAL_TABLET | Freq: Two times a day (BID) | ORAL | Status: DC
  Administered 2024-01-30 – 2024-02-01 (×7): 30 mg via ORAL
  Filled 2024-01-30 (×7): qty 1

## 2024-01-30 MED ORDER — LABETALOL HCL 200 MG PO TABS
600.0000 mg | ORAL_TABLET | Freq: Three times a day (TID) | ORAL | Status: DC
  Administered 2024-01-30: 600 mg via ORAL
  Filled 2024-01-30: qty 3

## 2024-01-30 MED ORDER — TERBUTALINE SULFATE 1 MG/ML IJ SOLN: 0.2500 mg | Freq: Once | INTRAMUSCULAR | Status: DC | PRN

## 2024-01-30 MED ORDER — OXYTOCIN BOLUS FROM INFUSION
333.0000 mL | Freq: Once | INTRAVENOUS | Status: AC
  Administered 2024-01-31: 333 mL via INTRAVENOUS

## 2024-01-30 MED ORDER — LABETALOL HCL 5 MG/ML IV SOLN: 40.0000 mg | INTRAVENOUS | Status: DC | PRN

## 2024-01-30 MED ORDER — MISOPROSTOL 50MCG HALF TABLET: 50.0000 ug | ORAL_TABLET | Freq: Once | ORAL | Status: DC

## 2024-01-30 MED ORDER — FENTANYL CITRATE (PF) 100 MCG/2ML IJ SOLN
50.0000 ug | INTRAMUSCULAR | Status: DC | PRN
  Administered 2024-01-30 (×2): 100 ug via INTRAVENOUS
  Filled 2024-01-30 (×2): qty 2

## 2024-01-30 MED ORDER — HYDRALAZINE HCL 20 MG/ML IJ SOLN: 10.0000 mg | INTRAMUSCULAR | Status: DC | PRN

## 2024-01-30 MED ORDER — LABETALOL HCL 5 MG/ML IV SOLN: 80.0000 mg | INTRAVENOUS | Status: DC | PRN

## 2024-01-30 MED ORDER — FENTANYL-BUPIVACAINE-NACL 0.5-0.125-0.9 MG/250ML-% EP SOLN
12.0000 mL/h | EPIDURAL | Status: DC | PRN
  Administered 2024-01-30: 12 mL/h via EPIDURAL
  Filled 2024-01-30: qty 250

## 2024-01-30 MED ORDER — MAGNESIUM SULFATE BOLUS VIA INFUSION
4.0000 g | Freq: Once | INTRAVENOUS | Status: AC
  Administered 2024-01-30: 4 g via INTRAVENOUS
  Filled 2024-01-30: qty 1000

## 2024-01-30 MED ORDER — OXYTOCIN-SODIUM CHLORIDE 30-0.9 UT/500ML-% IV SOLN
1.0000 m[IU]/min | INTRAVENOUS | Status: DC
  Administered 2024-01-30: 2 m[IU]/min via INTRAVENOUS
  Filled 2024-01-30: qty 500

## 2024-01-30 MED ORDER — MISOPROSTOL 25 MCG QUARTER TABLET
25.0000 ug | ORAL_TABLET | Freq: Once | ORAL | Status: AC
  Administered 2024-01-30: 25 ug via VAGINAL
  Filled 2024-01-30: qty 1

## 2024-01-30 MED ORDER — SOD CITRATE-CITRIC ACID 500-334 MG/5ML PO SOLN: 30.0000 mL | ORAL | Status: DC | PRN

## 2024-01-30 MED ORDER — LABETALOL HCL 5 MG/ML IV SOLN: 20.0000 mg | INTRAVENOUS | Status: DC | PRN

## 2024-01-30 MED ORDER — ASPIRIN 81 MG PO CHEW
81.0000 mg | CHEWABLE_TABLET | Freq: Every day | ORAL | Status: DC
  Administered 2024-01-30 – 2024-02-02 (×4): 81 mg via ORAL
  Filled 2024-01-30 (×4): qty 1

## 2024-01-30 MED ORDER — ASPIRIN 81 MG PO TBEC
81.0000 mg | DELAYED_RELEASE_TABLET | Freq: Every day | ORAL | Status: DC
  Filled 2024-01-30: qty 1

## 2024-01-30 MED ORDER — OXYCODONE-ACETAMINOPHEN 5-325 MG PO TABS: 1.0000 | ORAL_TABLET | ORAL | Status: DC | PRN

## 2024-01-30 MED ORDER — EPHEDRINE 5 MG/ML INJ
10.0000 mg | INTRAVENOUS | Status: DC | PRN
  Administered 2024-01-30: 10 mg via INTRAVENOUS

## 2024-01-30 MED ORDER — LACTATED RINGERS IV SOLN
INTRAVENOUS | Status: DC
Start: 2024-01-30 — End: 2024-01-31

## 2024-01-30 MED ORDER — MISOPROSTOL 50MCG HALF TABLET
50.0000 ug | ORAL_TABLET | Freq: Once | ORAL | Status: AC
  Administered 2024-01-30: 50 ug via ORAL
  Filled 2024-01-30: qty 1

## 2024-01-30 MED ORDER — OXYTOCIN-SODIUM CHLORIDE 30-0.9 UT/500ML-% IV SOLN
2.5000 [IU]/h | INTRAVENOUS | Status: DC
  Administered 2024-01-31: 2.5 [IU]/h via INTRAVENOUS
  Filled 2024-01-30: qty 500

## 2024-01-30 MED ORDER — LACTATED RINGERS IV SOLN
500.0000 mL | Freq: Once | INTRAVENOUS | Status: AC
  Administered 2024-01-30: 500 mL via INTRAVENOUS

## 2024-01-30 MED ORDER — LIDOCAINE-EPINEPHRINE (PF) 2 %-1:200000 IJ SOLN
INTRAMUSCULAR | Status: DC | PRN
Start: 2024-01-30 — End: 2024-01-31
  Administered 2024-01-30: 5 mL via EPIDURAL

## 2024-01-30 MED ORDER — LACTATED RINGERS IV SOLN
500.0000 mL | INTRAVENOUS | Status: DC | PRN
  Administered 2024-01-30 (×3): 500 mL via INTRAVENOUS

## 2024-01-30 MED ORDER — DIPHENHYDRAMINE HCL 50 MG/ML IJ SOLN: 12.5000 mg | INTRAMUSCULAR | Status: DC | PRN

## 2024-01-30 MED ORDER — BUPIVACAINE HCL (PF) 0.25 % IJ SOLN
INTRAMUSCULAR | Status: DC | PRN
  Administered 2024-01-30 (×2): 5 mL via EPIDURAL

## 2024-01-30 MED ORDER — LIDOCAINE HCL (PF) 1 % IJ SOLN: 30.0000 mL | INTRAMUSCULAR | Status: DC | PRN

## 2024-01-30 MED ORDER — ACETAMINOPHEN 325 MG PO TABS: 650.0000 mg | ORAL_TABLET | ORAL | Status: DC | PRN

## 2024-01-30 NOTE — Progress Notes (Signed)
 Labor Progress Note  Due for recheck -- progressing now and is in active labor. Continued cat II tracing with variables, but scattered not with every contraction and overall reassuring. Continue current labor management. BP mild to mod range, but has been normotensive throughout, just got her PM dose of Procardia. Will cont to watch closely.  Sundra Aland, MD 10:37 PM

## 2024-01-30 NOTE — H&P (Addendum)
 OBSTETRIC ADMISSION HISTORY AND PHYSICAL  Kaitlin Torres is a 43 y.o. female [phone] with IUP at [redacted]w[redacted]d by LMP presenting for IOL due to [facility] and GDM. She reports +FMs, No LOF, no VB, no blurry vision, headaches or RUQ pain. Does endores increased peripheral edema over the last 3 weeks. She plans on formula feeding. She is undecided on birth control.  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  Fibrocystic change.  Usual duct  epithelial hyperplasia.   CORONARY STENT INTERVENTION N/A 05/08/2019   Procedure: CORONARY STENT INTERVENTION;  Surgeon: Swaziland, Peter M, MD;  Location: [facility] INVASIVE CV LAB;  Service: Cardiovascular;  Laterality: N/A;   FRACTURE SURGERY  2005   right ankle   HEMORRHOID SURGERY     HEMORRHOID SURGERY  2015   INCISION AND DRAINAGE BREAST ABSCESS  09/2010   left breast    LEFT HEART CATH AND CORONARY ANGIOGRAPHY N/A 05/08/2019   Procedure: LEFT HEART CATH AND CORONARY ANGIOGRAPHY;  Surgeon: Swaziland, Peter M, MD;  Location: [facility] INVASIVE CV LAB;  Service: Cardiovascular;  Laterality: N/A;    Obstetrical History: OB History      Gravida  4   Para  3   Term  3   Preterm      AB      Living  3      SAB      IAB      Ectopic      Multiple      Live Births  3           Social History Social History   Socioeconomic History   Marital status: Single    Spouse name: Not on file   Number of children: 3   Years of education: 14   Highest education level: Associate degree: academic program  Occupational History   Occupation: Waitress  Tobacco Use   Smoking status: Former    Current packs/day: 0.00    Average packs/day: 0.1 packs/day for 26.0 years (2.6 ttl pk-yrs)    Types: Cigarettes    Start date: 09/15/1993    Quit date: 09/16/2019    Years since quitting: 4.3   Smokeless tobacco: Never  Vaping Use   Vaping status: Every Day   Substances: Nicotine, Flavoring  Substance and Sexual Activity   Alcohol use: No    Comment: quit 2018   Drug use: No   Sexual activity: Yes    Birth control/protection: None  Other Topics Concern   Not on file  Social History  Narrative   Lives at home with her mother, husband and three daughters.   Right-handed.   Approximately 20 cups caffeine per day (4-5 glasses holding 32oz of tea).   Social Drivers of Health   Financial Resource Strain: Medium Risk (07/06/2023)   Overall Financial Resource Strain (CARDIA)    Difficulty of Paying Living Expenses: Somewhat hard  Food Insecurity: No Food Insecurity (01/30/2024)   Hunger Vital Sign    Worried About Running Out of Food in the Last Year: Never true    Ran Out of Food in the Last Year: Never true  Transportation Needs: No Transportation Needs (01/30/2024)   PRAPARE - Administrator, Civil Service (Medical): No    Lack of Transportation (Non-Medical): No  Physical Activity: Sufficiently Active (07/06/2023)   Exercise Vital Sign    Days of Exercise per Week: 5 days    Minutes of Exercise per Session: 90 min  Stress: No Stress Concern Present (07/06/2023)   [employer] of Occupational  Health - Occupational Stress Questionnaire    Feeling of Stress : Only a little  Social Connections: Moderately Isolated (07/06/2023)   Social Connection and Isolation Panel [NHANES]    Frequency of Communication with Friends and Family: More than three times a week    Frequency of Social Gatherings with Friends and Family: More than three times a week    Attends Religious Services: Never    Database administrator or Organizations: No    Attends Engineer, structural: Never    Marital Status: Living with partner    Family History: Family History  Problem Relation Age of Onset   Hypertension Mother    Peripheral Artery Disease Mother    Heart disease Father        Cardiac arrest 2006   Diabetes Father    Hyperlipidemia Father    Stroke Father    Aneurysm Brother    Cervical cancer Maternal Grandmother    Diabetes Paternal Grandmother     Allergies: Allergies  Allergen Reactions   Diclofenac Hives    Medications Prior to Admission  Medication Sig Dispense Refill Last Dose/Taking   aspirin EC 81 MG tablet Take 2 tablets (162 mg total) by mouth daily. Swallow whole. 180 tablet 2 01/29/2024 at  3:00 AM   aspirin-acetaminophen-caffeine (EXCEDRIN MIGRAINE) 250-250-65 MG tablet Take 2 tablets by mouth every 6 (six) hours as needed for headache.   Past Week   fluticasone (FLONASE) 50 MCG/ACT nasal spray Place 1 spray into both nostrils daily. 9.9 mL 2 Past Week   labetalol (NORMODYNE) 200 MG tablet Take 3 tablets (600 mg total) by mouth 3 (three) times daily. 270 tablet 3 01/29/2024 at 10:00 PM   NIFEdipine (PROCARDIA-XL/NIFEDICAL-XL) 30 MG 24 hr tablet Take 1 tablet (30 mg total) by mouth 2 (two) times daily. 60 tablet 4 01/29/2024 at  3:00 PM   Prenatal Vit-Fe Sulfate-FA-DHA (PRENATAL VITAMIN/MIN +DHA) 27-0.8-200 MG CAPS Take 1 tablet by mouth daily. 100 capsule 11 Past Week   Accu-Chek Softclix Lancets lancets Use as instructed to check blood sugar 4 times daily 100 each 12     acetaminophen-caffeine (EXCEDRIN TENSION HEADACHE) 500-65 MG TABS per tablet Take 2 tablets by mouth 4 (four) times daily as needed (Foe headaches). 60 tablet 0    Blood Glucose Monitoring Suppl (ACCU-CHEK GUIDE ME) w/Device KIT 1 each by Does not apply route 4 (four) times daily. 1 kit 0    Blood Pressure Monitor MISC For regular  home bp monitoring during pregnancy 1 each 0    furosemide (LASIX) 40 MG tablet Take 1 tablet (40 mg total) by mouth daily for 3 days. (Patient not taking: Reported on 01/12/2024) 3 tablet 0    glucose blood test strip Use as instructed to check blood sugar four times daily 100 each 12    potassium chloride SA (KLOR-CON M20) 20 MEQ tablet Take 1 tablet (20 mEq total) by mouth daily for 3 days. (Patient not taking: Reported on 01/12/2024) 3 tablet 0      Review of Systems   All systems reviewed and negative except as stated in HPI  Blood pressure 114/74, pulse 72, temperature 98 F (36.7 C), temperature source Oral, resp. rate 20, height 5\' 9"  (1.753 m), weight 242 lb 11.2 oz (110.1 kg), last menstrual period 05/16/2023. General appearance: alert, cooperative, appears stated age, and no distress Lungs: breathing comfortably Heart: regular rate and rhythm Abdomen: soft, non-tender; bowel sounds normal Pelvic: see cervical exam Extremities: Homans sign is negative, no sign of DVT Presentation: cephalic Fetal monitoringBaseline: 130 bpm moderate variability; accelerations  Uterine activityNone Dilation: Fingertip Effacement (%): Thick Exam by:: Dr. Lucianne Muss   Prenatal labs: ABO, Rh: --/--/A POS (03/25 0020) Antibody: POS (03/25 0020) Rubella: 7.85 (10/16 1404) RPR: Non Reactive (01/14 0842)  HBsAg: Negative (10/16 1404)  HIV: Non Reactive (01/14 0842)  GBS: Negative/-- (03/19 1433)    Lab Results  Component Value Date   GBS Negative 01/24/2024   GTT A1DM Genetic screening  LR female  Anatomy US WNL  Immunization History  Administered Date(s) Administered    Tdap 03/06/2018, 03/12/2020, 12/26/2023    Prenatal Transfer Tool  Maternal Diabetes: Yes:  Diabetes Type:  Insulin/Medication controlled Genetic Screening: Normal Maternal Ultrasounds/Referrals: Normal Fetal Ultrasounds or other Referrals:  Referred to Materal Fetal Medicine  Maternal Substance Abuse:  No Significant Maternal Medications:  Meds include: Other: procardia, lasix, labetalol aspirin  Significant Maternal Lab Results: Group B Strep negative Number of Prenatal Visits:Less than or equal to 3 verified prenatal visits Maternal Vaccinations:TDap Other Comments:  None   Results for orders placed or performed during the hospital encounter of 01/30/24 (from the past 24 hours)  Type and screen   Collection Time: 01/30/24 12:20 AM  Result Value Ref Range   ABO/RH(D) A POS    Antibody Screen POS    Sample Expiration      02/02/2024,2359 Performed at [facility], [address] [place], [place] [address]   CBC   Collection Time: 01/30/24 12:23 AM  Result Value Ref Range   WBC 11.7 (H) 4.0 - 10.5 K/uL   RBC 5.38 (H) 3.87 - 5.11 MIL/uL   Hemoglobin 13.7 12.0 - 15.0 g/dL   HCT 24.4 01.0 - 27.2 %   MCV 75.5 (L) 80.0 - 100.0 fL   MCH 25.5 (L) 26.0 - 34.0 pg   MCHC 33.7 30.0 - 36.0 g/dL   RDW 53.6 (H) 64.4 - 03.4 %   Platelets 244 150 - 400 K/uL   nRBC 0.0 0.0 - 0.2 %  Comprehensive metabolic panel   Collection Time: 01/30/24 12:23 AM  Result Value Ref Range   Sodium 135 135 - 145 mmol/L   Potassium 3.6 3.5 - 5.1 mmol/L   Chloride 109 98 - 111 mmol/L   CO2 18 (L) 22 - 32 mmol/L   Glucose, Bld 89 70 - 99 mg/dL   BUN 12 6 - 20 mg/dL   Creatinine, Ser 7.42 0.44 -  1.00 mg/dL   Calcium 9.3 8.9 - 96.0 mg/dL   Total Protein 6.4 (L) 6.5 - 8.1 g/dL   Albumin 3.0 (L) 3.5 - 5.0 g/dL   AST 40 15 - 41 U/L   ALT 33 0 - 44 U/L   Alkaline Phosphatase 246 (H) 38 - 126 U/L   Total Bilirubin 0.3 0.0 - 1.2 mg/dL   GFR, Estimated >45 >40 mL/min   Anion gap 8 5 - 15     Patient Active Problem List   Diagnosis Date Noted   Hypertension, uncontrolled 01/30/2024   Gestational diabetes 11/22/2023   Alpha thalassemia silent carrier 09/04/2023   Anti-E antibody 08/28/2023   Chronic hypertension affecting pregnancy 08/22/2023   AMA (advanced maternal age) multigravida 35+ 08/22/2023   Supervision of high-risk pregnancy 08/18/2023   Abnormal TSH 02/09/2021   Back pain without sciatica 11/04/2020   Iron deficiency anemia 09/08/2020   Essential hypertension 06/10/2019   Mixed hyperlipidemia 06/10/2019   GAD (generalized anxiety disorder) 06/10/2019   Vitamin D deficiency 06/10/2019   PCOS (polycystic ovarian syndrome) 06/10/2019   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 G4P3003 at [redacted]w[redacted]d here for IOL due to [facility] and GDM. Pt reports bps >160/110 at home. Reports has been consistent with bp medications. Fasting glucose 80 with post prandial <120. Pt reports URI symptoms of cough and congestion since 01/19/24. FOB tested positive for Covid earlier in the month and pt's daughter tested positive for COVID yesterday. Pt denies any fevers, or malaise. Pt also with hx of MI s/p stent placement in 2020. This is her first delivery since MI.   #Labor: Discussed process of IOL w pt -- cx fingertip and thick, will start with dual cytotec and reassess for placement of FB #Pain: Per pt request  rec early use of epidural if desired to cardiac strain #FWB: Cat I #GBS status:  Negative #Feeding: Formula #Reproductive Life planning: Undecided #Circ:  no  #GDM: A1GDM. Pt reports fasting bg in the 80s at home with post post prandial below 120. POC glucose Q4 hours until in active labor then Q2. Endotool as needed.  #CHTN: Pt on procardia 30 BID, labetalol 600 mg TID and aspirin. Bp WNL on presentation. Pre-e labs collected and WNL. Continue to monitor. Continue home  medications.  #Hx MI s/p stent: baseline EKG obtained on admission. Pt seen by cardiology with no labor limitations.  #URI symptoms: COVID swab obtained. Results pending. Precautions in place.    Joie Bimler, MD 01/30/2024, 1:48 AM  Attestation of Supervision of Resident:  I confirm that I have verified the information documented in the resident's note and that I have also personally reperformed the history, physical exam and all medical decision making activities.  I have verified that all services and findings are accurately documented in this resident's note; and I agree with management and plan as outlined in the documentation. I have also made any necessary editorial changes.  Sundra Aland, MD OB Fellow, Faculty Practice [facility], Center for [facility]

## 2024-01-30 NOTE — Progress Notes (Signed)
 Pt frustrated with repositioning, cords/wires/monitors, and room clutter. Discussed importance of movement to help babe move through the pelvis. Pt agreeable, just tired.   Blood pressure 116/63, pulse (!) 55, temperature (!) 97 F (36.1 C), temperature source Axillary, resp. rate 18, height 5\' 9"  (1.753 m), weight 110.1 kg, last menstrual period 05/16/2023, SpO2 99%.  EFM: baseline 125, accels, mild recurrent late decels, moderate variability TOCO: q2.5-57min contractions  Dilation: 3.5 Effacement (%): 80, 90 Cervical Position: Anterior Station: -2 Presentation: Vertex Exam by:: Kaoru Benda MD  Continue to titrate pitocin as able.  Correct relative hypotension; s/p phenylephrine and ephedrine, IVF bolus running now.  Consider IUPC at next check if not yet active labor  Wyn Forster, MD FMOB Fellow, Faculty practice [facility], Center for [facility]

## 2024-01-30 NOTE — Progress Notes (Addendum)
 Labor Progress Note Kaitlin Torres is a 43 y.o. [id number] at [redacted]w[redacted]d presented for IOL for cHTN.  S: Patient waking from deep sleep. Not feeling any pain with epidural.  O:  BP 110/64   Pulse (!) 57   Temp 97.7 F (36.5 C) (Oral)   Resp 18   Ht 5\' 9"  (1.753 m)   Wt 110.1 kg   LMP 05/16/2023   SpO2 99%   BMI 35.84 kg/m  FHT: 120bpm baseline, moderate variability, + accels, no decels  CVE: Dilation: 3.5 Effacement (%): 50 Cervical Position: Anterior Station: -3 Presentation: Vertex Exam by:: Anela Bensman MD   A&P:  Kaitlin Torres is a 43 y.o. G4P3003 at [redacted]w[redacted]d who presented for IOL for cHTN.  --IOL: Most recent SVE 3.5/50/-3 at 13:30 with AROMc at that time (head not ballotable). Plan to start pitocin.         --Epidural in place         --S/p cytotec and FB (out at 09:30) --FWB: Category I, reassuring. Continue to monitor. --cHTN: MRBPs and multiple SRBPs intrapartum. Asymptomatic. Continue to monitor.         --Labs: Repeats drawn for epidural, unremarkable from BP standpoint         --Meds: Labetalol 600mg  TID, procardia 30mg  BID, ASA 81mg  daily, Hydralazine protocol, Magnesium for seizure prophylaxis --gDMA1: CBGs q4hr in latent labor, q2hr in active labor. --AMA --PCOS --CAD with hx NSTEMI s/p PCI in 2020: On ASA, statin stopped during pregnancy. Plan to restart statin PP. --GAD --Vape use   --Rh pos/M OBH/GBS neg/bottle/undecided  GME ATTESTATION:  Evaluation and management procedures were performed by the [facility] Medicine Resident under my supervision. I was immediately available for direct supervision, assistance and direction throughout this encounter.  I also confirm that I have verified the information documented in the resident's note, and that I have also personally reperformed the pertinent components of the physical exam and all of the medical decision making activities.  I have also made any necessary editorial changes.  Wyn Forster, MD OB Fellow, Faculty  Practice [facility], Center for [facility] 01/30/2024 2:42 PM

## 2024-01-30 NOTE — Progress Notes (Signed)
 Labor Progress Note Kaitlin Torres is a 43 y.o. [phone] at [redacted]w[redacted]d presented for IOL due to uncontrolled cHTN  S: RN called to discuss BP -- pt with SBP >160 x 2 readings, however is due for Labetalol. At home, BP runs in the 160s SBP at home and on chart review, has had 150s in clinic. No HA, vision changes, SOB, RUQ pain, edema.   O:  BP (!) 162/88   Pulse 68   Temp 98 F (36.7 C) (Oral)   Resp 20   Ht 5\' 9"  (1.753 m)   Wt 110.1 kg   LMP 05/16/2023   BMI 35.84 kg/m  EFM: 135/mod/+a/-d  CVE: Dilation: Fingertip Effacement (%): Thick Presentation: Vertex (by bedside US by Dr. Cheree Ditto) Exam by:: Dr. Lucianne Muss   A&P: 43 y.o. [id number] [redacted]w[redacted]d here for IOL due to uncontrolled cHTN #Labor: Progressing well. S/p dual cytotec, now 1.5cm, discussed FB and pt verbally consented. Pt given IV Fentanyl prior, FB placed and balloon inflated to 40cc. Pt tolerated well.  #Pain: Per pt request  #FWB: Cat I #GBS negative  #cHTN: High suspicion that BP elevated due to pt being due for Labetalol dose, so will give and then recheck BP. If still elevated, would revisit consideration for pre-e w SF and initiation of Labetalol protocol/Mag, however given reported home readings and in absence of sxs, will proceed w home meds and close monitoring. PEC labs ok.  #A1GDM: BG ok on CMP, cont q4h checks  #CAD s/p PCI: Baseline EKG collected and ok  restart statin and cont ldASA PP  #URI sxs: Neg for COVID   Sundra Aland, MD 5:16 AM

## 2024-01-30 NOTE — Anesthesia Procedure Notes (Signed)
 Epidural Patient location during procedure: OB Start time: 01/30/2024 11:37 AM End time: 01/30/2024 11:44 AM  Staffing Anesthesiologist: Mariann Barter, MD Performed: anesthesiologist   Preanesthetic Checklist Completed: patient identified, IV checked, site marked, risks and benefits discussed, surgical consent, monitors and equipment checked, pre-op evaluation and timeout performed  Epidural Patient position: sitting Prep: DuraPrep Patient monitoring: heart rate, continuous pulse ox and blood pressure Approach: midline Location: L4-L5 Injection technique: LOR saline  Needle:  Needle type: Tuohy  Needle gauge: 17 G Needle length: 9 cm Needle insertion depth: 7 cm Catheter type: closed end flexible Catheter size: 19 Gauge Catheter at skin depth: 12 cm Test dose: negative and 1.5% lidocaine with Epi 1:200 K  Assessment Events: blood not aspirated, no cerebrospinal fluid, injection not painful, no injection resistance, no paresthesia and negative IV test  Additional Notes Reason for block:procedure for pain

## 2024-01-30 NOTE — Progress Notes (Signed)
 Labor Progress Note Kaitlin Torres is a 43 y.o. [id number] at [redacted]w[redacted]d who presented for IOL for cHTN.  S: Patient very uncomfortable, feeling contractions. Interested in epidural for pain control. Balloon came out.  O:  BP (!) 143/89   Pulse 63   Temp 98 F (36.7 C) (Oral)   Resp 18   Ht 5\' 9"  (1.753 m)   Wt 110.1 kg   LMP 05/16/2023   SpO2 98%   BMI 35.84 kg/m  FHR: baseline 125bpm, moderate variability, + accels, 1 variable decel at 09:40 CTX q2-79min on toco.  CVE: Dilation: 1.5 Effacement (%): Thick Station: -3 Presentation: Vertex Exam by:: Dr. Lucianne Muss   A&P:   Kaitlin Torres is a 43 y.o. [phone] at [redacted]w[redacted]d who presented for IOL for cHTN. --IOL: FB out at 09:30. Cervix remains very posterior and patient unable to tolerate SVE, so plan for epidural then SVE to determine appropriateness for AROM.         --S/p cytotec and FB --FWB: Category II due to 1 variable decel at 09:40, but remains overall reassuring. Continue to monitor. --cHTN: MRBPs and multiple SRBPs intrapartum. Asymptomatic. Continue to monitor.         --Labs: Repeats drawn for epidural         --Meds: Labetalol 600mg  TID, procardia 30mg  BID, ASA 81mg  daily, Hydralazine protocol, Magnesium for seizure prophylaxis --gDMA1: CBGs q4hr in latent labor, q2hr in active labor. --AMA --PCOS --CAD with hx NSTEMI s/p PCI in 2020: On ASA, statin stopped during pregnancy. Plan to restart statin PP.  --Rh pos/M OBH/GBS neg/bottle/undecided   Thereasa Solo, MD 9:45 AM

## 2024-01-30 NOTE — Anesthesia Preprocedure Evaluation (Signed)
 Anesthesia Evaluation  Patient identified by MRN, date of birth, ID band Patient awake    Reviewed: Allergy & Precautions, NPO status , Patient's Chart, lab work & pertinent test results, reviewed documented beta blocker date and time   Airway Mallampati: III  TM Distance: >3 FB   Mouth opening: Limited Mouth Opening  Dental no notable dental hx.    Pulmonary neg shortness of breath, neg COPD, Recent URI , Residual Cough, former smoker   breath sounds clear to auscultation       Cardiovascular hypertension, + Past MI and + Cardiac Stents  (-) CABG and (-) CHF  Rhythm:Regular Rate:Normal  Last TTE WNL   Neuro/Psych  Headaches, neg Seizures PSYCHIATRIC DISORDERS Anxiety        GI/Hepatic ,GERD  ,,(+) neg Cirrhosis        Endo/Other  diabetes, Gestational    Renal/GU Renal disease     Musculoskeletal   Abdominal   Peds  Hematology  (+) Blood dyscrasia, anemia   Anesthesia Other Findings   Reproductive/Obstetrics (+) Pregnancy                              Anesthesia Physical Anesthesia Plan  ASA: 3  Anesthesia Plan: Epidural   Post-op Pain Management:    Induction:   PONV Risk Score and Plan: 2 and Ondansetron  Airway Management Planned:   Additional Equipment:   Intra-op Plan:   Post-operative Plan:   Informed Consent: I have reviewed the patients History and Physical, chart, labs and discussed the procedure including the risks, benefits and alternatives for the proposed anesthesia with the patient or authorized representative who has indicated his/her understanding and acceptance.       Plan Discussed with:   Anesthesia Plan Comments:          Anesthesia Quick Evaluation

## 2024-01-30 NOTE — Progress Notes (Signed)
 Labor Progress Note Kaitlin Torres is a 43 y.o. [phone] at [redacted]w[redacted]d presented for IOL due to uncontrolled HTN, now with SIPE with SF, on Mag. Has not required any PRN doses. Cat II strip due to variables, but shallow and resolve back to baseline w mod variability. Ctx every 2-4 min. S/p AROM. Progressing well, but slowly. Discussed would consider IUPC if unchanged or minimal change at next check.   Sundra Aland, MD 8:54 PM

## 2024-01-30 NOTE — Progress Notes (Signed)
 Labor Progress Note Kaitlin Torres is a 43 y.o. [phone] at [redacted]w[redacted]d presented for IOL due to uncontrolled cHTN  BP severe range despite being caught up on home meds. No sxs pre-e. Discussed concern for SIPE with SF with patient. Will start mag, hydral protocol.   CBGs ok.  COVID neg, tx symptoms as needed.  Cont induction process, has been contracting regularly, though difficult to pick up on monitor.  Sundra Aland, MD 7:58 AM

## 2024-01-31 ENCOUNTER — Encounter (HOSPITAL_COMMUNITY): Payer: Self-pay | Admitting: Obstetrics & Gynecology

## 2024-01-31 DIAGNOSIS — O99214 Obesity complicating childbirth: Secondary | ICD-10-CM | POA: Diagnosis not present

## 2024-01-31 DIAGNOSIS — O24424 Gestational diabetes mellitus in childbirth, insulin controlled: Secondary | ICD-10-CM | POA: Diagnosis not present

## 2024-01-31 DIAGNOSIS — O09523 Supervision of elderly multigravida, third trimester: Secondary | ICD-10-CM | POA: Diagnosis not present

## 2024-01-31 DIAGNOSIS — Z3A37 37 weeks gestation of pregnancy: Secondary | ICD-10-CM | POA: Diagnosis not present

## 2024-01-31 DIAGNOSIS — O1414 Severe pre-eclampsia complicating childbirth: Secondary | ICD-10-CM | POA: Diagnosis not present

## 2024-01-31 LAB — CBC
HCT: 43.9 % (ref 36.0–46.0)
HCT: 39.6 % (ref 36.0–46.0)
Hemoglobin: 14.7 g/dL (ref 12.0–15.0)
Hemoglobin: 13.2 g/dL (ref 12.0–15.0)
MCH: 25.2 pg — ABNORMAL LOW (ref 26.0–34.0)
MCH: 25.1 pg — ABNORMAL LOW (ref 26.0–34.0)
MCHC: 33.5 g/dL (ref 30.0–36.0)
MCHC: 33.3 g/dL (ref 30.0–36.0)
MCV: 75.3 fL — ABNORMAL LOW (ref 80.0–100.0)
MCV: 75.4 fL — ABNORMAL LOW (ref 80.0–100.0)
Platelets: 240 10*3/uL (ref 150–400)
Platelets: 242 10*3/uL (ref 150–400)
RBC: 5.83 MIL/uL — ABNORMAL HIGH (ref 3.87–5.11)
RBC: 5.25 MIL/uL — ABNORMAL HIGH (ref 3.87–5.11)
RDW: 18 % — ABNORMAL HIGH (ref 11.5–15.5)
RDW: 18 % — ABNORMAL HIGH (ref 11.5–15.5)
WBC: 24.9 10*3/uL — ABNORMAL HIGH (ref 4.0–10.5)
WBC: 25.1 10*3/uL — ABNORMAL HIGH (ref 4.0–10.5)
nRBC: 0 % (ref 0.0–0.2)
nRBC: 0 % (ref 0.0–0.2)

## 2024-01-31 LAB — COMPREHENSIVE METABOLIC PANEL
ALT: 25 U/L (ref 0–44)
AST: 29 U/L (ref 15–41)
Albumin: 2.3 g/dL — ABNORMAL LOW (ref 3.5–5.0)
Alkaline Phosphatase: 213 U/L — ABNORMAL HIGH (ref 38–126)
Anion gap: 9 (ref 5–15)
BUN: 10 mg/dL (ref 6–20)
CO2: 15 mmol/L — ABNORMAL LOW (ref 22–32)
Calcium: 7.8 mg/dL — ABNORMAL LOW (ref 8.9–10.3)
Chloride: 109 mmol/L (ref 98–111)
Creatinine, Ser: 0.73 mg/dL (ref 0.44–1.00)
GFR, Estimated: >60 mL/min (ref >60)
Glucose, Bld: 128 mg/dL — ABNORMAL HIGH (ref 70–99)
Potassium: 4 mmol/L (ref 3.5–5.1)
Sodium: 133 mmol/L — ABNORMAL LOW (ref 135–145)
Total Bilirubin: 0.4 mg/dL (ref 0.0–1.2)
Total Protein: 5.4 g/dL — ABNORMAL LOW (ref 6.5–8.1)

## 2024-01-31 LAB — BIRTH TISSUE RECOVERY COLLECTION (PLACENTA DONATION)

## 2024-01-31 LAB — GLUCOSE, CAPILLARY
Glucose-Capillary: 99 mg/dL (ref 70–99)
Glucose-Capillary: 149 mg/dL — ABNORMAL HIGH (ref 70–99)

## 2024-01-31 LAB — MAGNESIUM: Magnesium: 4.6 mg/dL — ABNORMAL HIGH (ref 1.7–2.4)

## 2024-01-31 MED ORDER — ONDANSETRON HCL 4 MG PO TABS: 4.0000 mg | ORAL_TABLET | ORAL | Status: DC | PRN

## 2024-01-31 MED ORDER — DIPHENHYDRAMINE HCL 25 MG PO CAPS: 25.0000 mg | ORAL_CAPSULE | Freq: Four times a day (QID) | ORAL | Status: DC | PRN

## 2024-01-31 MED ORDER — FUROSEMIDE 20 MG PO TABS
20.0000 mg | ORAL_TABLET | Freq: Every day | ORAL | Status: DC
  Administered 2024-01-31 – 2024-02-02 (×3): 20 mg via ORAL
  Filled 2024-01-31 (×3): qty 1

## 2024-01-31 MED ORDER — TRANEXAMIC ACID-NACL 1000-0.7 MG/100ML-% IV SOLN: 1000.0000 mg | Freq: Once | INTRAVENOUS | Status: AC

## 2024-01-31 MED ORDER — POTASSIUM CHLORIDE CRYS ER 20 MEQ PO TBCR
20.0000 meq | EXTENDED_RELEASE_TABLET | Freq: Every day | ORAL | Status: DC
  Administered 2024-01-31 – 2024-02-02 (×3): 20 meq via ORAL
  Filled 2024-01-31 (×3): qty 1

## 2024-01-31 MED ORDER — SIMETHICONE 80 MG PO CHEW: 80.0000 mg | CHEWABLE_TABLET | ORAL | Status: DC | PRN

## 2024-01-31 MED ORDER — SODIUM CHLORIDE 0.9% FLUSH: 3.0000 mL | INTRAVENOUS | Status: DC | PRN

## 2024-01-31 MED ORDER — SENNOSIDES-DOCUSATE SODIUM 8.6-50 MG PO TABS
2.0000 | ORAL_TABLET | ORAL | Status: DC
  Administered 2024-01-31 – 2024-02-02 (×3): 2 via ORAL
  Filled 2024-01-31 (×3): qty 2

## 2024-01-31 MED ORDER — WITCH HAZEL-GLYCERIN EX PADS: 1.0000 | MEDICATED_PAD | CUTANEOUS | Status: DC | PRN

## 2024-01-31 MED ORDER — LABETALOL HCL 200 MG PO TABS: 300.0000 mg | ORAL_TABLET | Freq: Three times a day (TID) | ORAL | Status: DC

## 2024-01-31 MED ORDER — MAGNESIUM SULFATE 40 GM/1000ML IV SOLN
2.0000 g/h | INTRAVENOUS | Status: AC
Start: 2024-01-31 — End: 2024-02-01
  Administered 2024-01-31: 2 g/h via INTRAVENOUS
  Filled 2024-01-31: qty 1000

## 2024-01-31 MED ORDER — ACETAMINOPHEN 500 MG PO TABS
1000.0000 mg | ORAL_TABLET | Freq: Three times a day (TID) | ORAL | Status: DC
  Administered 2024-01-31 – 2024-02-02 (×6): 1000 mg via ORAL
  Filled 2024-01-31 (×8): qty 2

## 2024-01-31 MED ORDER — SODIUM CHLORIDE 0.9% FLUSH
3.0000 mL | Freq: Two times a day (BID) | INTRAVENOUS | Status: DC
  Administered 2024-02-01 – 2024-02-02 (×3): 3 mL via INTRAVENOUS

## 2024-01-31 MED ORDER — LABETALOL HCL 200 MG PO TABS
300.0000 mg | ORAL_TABLET | Freq: Three times a day (TID) | ORAL | Status: DC
  Administered 2024-01-31 – 2024-02-02 (×9): 300 mg via ORAL
  Filled 2024-01-31 (×9): qty 1

## 2024-01-31 MED ORDER — COCONUT OIL OIL: 1.0000 | TOPICAL_OIL | Status: DC | PRN

## 2024-01-31 MED ORDER — SODIUM CHLORIDE 0.9 % IV SOLN: 250.0000 mL | INTRAVENOUS | Status: DC | PRN

## 2024-01-31 MED ORDER — CEFAZOLIN SODIUM-DEXTROSE 2-4 GM/100ML-% IV SOLN
2.0000 g | Freq: Once | INTRAVENOUS | Status: AC
Start: 2024-01-31 — End: 2024-01-31
  Administered 2024-01-31: 2 g via INTRAVENOUS
  Filled 2024-01-31: qty 100

## 2024-01-31 MED ORDER — ATORVASTATIN CALCIUM 80 MG PO TABS
80.0000 mg | ORAL_TABLET | Freq: Every day | ORAL | Status: DC
  Administered 2024-02-01 – 2024-02-02 (×2): 80 mg via ORAL
  Filled 2024-01-31 (×3): qty 1

## 2024-01-31 MED ORDER — ONDANSETRON HCL 4 MG/2ML IJ SOLN: 4.0000 mg | INTRAMUSCULAR | Status: DC | PRN

## 2024-01-31 MED ORDER — BENZOCAINE-MENTHOL 20-0.5 % EX AERO: 1.0000 | INHALATION_SPRAY | CUTANEOUS | Status: DC | PRN

## 2024-01-31 MED ORDER — TRANEXAMIC ACID-NACL 1000-0.7 MG/100ML-% IV SOLN
INTRAVENOUS | Status: AC
Start: 2024-01-31 — End: 2024-01-31
  Administered 2024-01-31: 1000 mg via INTRAVENOUS
  Filled 2024-01-31: qty 100

## 2024-01-31 MED ORDER — PRENATAL MULTIVITAMIN CH
1.0000 | ORAL_TABLET | Freq: Every day | ORAL | Status: DC
  Administered 2024-01-31 – 2024-02-02 (×3): 1 via ORAL
  Filled 2024-01-31 (×3): qty 1

## 2024-01-31 MED ORDER — ACETAMINOPHEN 325 MG PO TABS: 650.0000 mg | ORAL_TABLET | ORAL | Status: DC | PRN

## 2024-01-31 MED ORDER — DIBUCAINE (PERIANAL) 1 % EX OINT: 1.0000 | TOPICAL_OINTMENT | CUTANEOUS | Status: DC | PRN

## 2024-01-31 NOTE — Anesthesia Postprocedure Evaluation (Signed)
 Anesthesia Post Note  Patient: Kaitlin Torres  Procedure(s) Performed: AN AD HOC LABOR EPIDURAL     Patient location during evaluation: Mother Baby Anesthesia Type: Epidural Level of consciousness: awake and alert Pain management: pain level controlled Vital Signs Assessment: post-procedure vital signs reviewed and stable Respiratory status: spontaneous breathing, nonlabored ventilation and respiratory function stable Cardiovascular status: stable Postop Assessment: no headache, no backache and epidural receding Anesthetic complications: no   No notable events documented.  Last Vitals:  Vitals:   01/31/24 1409 01/31/24 1500  BP:    Pulse:    Resp: 20 18  Temp:    SpO2:      Last Pain:  Vitals:   01/31/24 1409  TempSrc:   PainSc: 0-No pain   Pain Goal:                   [name]

## 2024-01-31 NOTE — Progress Notes (Signed)
 Labor Progress Note  Feeling more pressure now, does not have overwhelming urge to push. On my exam, anterior lip which was easily reducible with pushing w one contraction. We pushed for 4-5 contractions and pt unable to push effectively for more than 6-7 seconds. Cat II tracing, though mod variability and reassuring. Will allow to labor down for 20-30 min unless there is a change in the tracing then resume pushing.  Sundra Aland, MD 1:29 AM

## 2024-01-31 NOTE — Discharge Summary (Signed)
 Postpartum Discharge Summary  Date of Service updated-3/28     Patient Name: Kaitlin Torres DOB: [birth year] MRN: [id number]  Date of admission: 01/30/2024 Delivery date:01/31/2024 Delivering provider: Sundra Aland Date of discharge: 02/02/2024  Admitting diagnosis: Hypertension, uncontrolled [I10] Intrauterine pregnancy: [redacted]w[redacted]d     Secondary diagnosis:  Active Problems:   History of non-ST elevation myocardial infarction (NSTEMI)   Chronic hypertension affecting pregnancy   Gestational diabetes   Severe preeclampsia  Additional problems: none    Discharge diagnosis: Term Pregnancy Delivered, CHTN with superimposed preeclampsia, and GDM A1                                              Post partum procedures: none Augmentation: AROM, Pitocin, Cytotec, and IP Foley Complications: None  Hospital course: Induction of Labor With Vaginal Delivery   43 y.o. yo [id number] at [redacted]w[redacted]d was admitted to the hospital 01/30/2024 for induction of labor.  Indication for induction:  uncontrolled chronic HTN, A1GDM .  Patient had an uncomplicated labor course. Membrane Rupture Time/Date: 1:37 PM,01/30/2024  Delivery Method:Vaginal, Spontaneous Operative Delivery:N/A Episiotomy: None Lacerations:  None Details of delivery can be found in separate delivery note.  Patient had a postpartum course complicated by cHTN with superimposed preeclampsia.  She was treated with IV Mag, Lasix and anti-hypotensives. Patient is discharged home 02/02/24 with plans for close outpatient follow up with cardiology regarding BP management.  Newborn Data: Birth date:01/31/2024 Birth time:1:56 AM Gender:Female Living status:Living Apgars:7 ,8  Weight:5 lb 3.3 oz (2.36 kg)  Magnesium Sulfate received: Yes: Seizure prophylaxis BMZ received: No Rhophylac:N/A MMR:N/A T-DaP:Given prenatally Flu: No RSV Vaccine received: No Transfusion:No  Immunizations received: Immunization History  Administered Date(s)  Administered   Tdap 03/06/2018, 03/12/2020, 12/26/2023    Physical exam  Vitals:   02/02/24 1005 02/02/24 1008 02/02/24 1123 02/02/24 1520  BP: (!) 160/90 (!) 167/94 (!) 151/83 132/78  Pulse: 80 88 72 80  Resp:   18 18  Temp:   97.8 F (36.6 C) 98.3 F (36.8 C)  TempSrc:   Oral Oral  SpO2:   97% 99%  Weight:      Height:       General: alert, cooperative, and no distress Lochia: appropriate Uterine Fundus: firm Incision: N/A DVT Evaluation: No evidence of DVT seen on physical exam. Labs: Lab Results  Component Value Date   WBC 25.1 (H) 01/31/2024   HGB 13.2 01/31/2024   HCT 39.6 01/31/2024   MCV 75.4 (L) 01/31/2024   PLT 242 01/31/2024      Latest Ref Rng & Units 01/31/2024    8:12 AM  CMP  Glucose 70 - 99 mg/dL 147   BUN 6 - 20 mg/dL 10   Creatinine 8.29 - 1.00 mg/dL 5.62   Sodium 130 - 865 mmol/L 133   Potassium 3.5 - 5.1 mmol/L 4.0   Chloride 98 - 111 mmol/L 109   CO2 22 - 32 mmol/L 15   Calcium 8.9 - 10.3 mg/dL 7.8   Total Protein 6.5 - 8.1 g/dL 5.4   Total Bilirubin 0.0 - 1.2 mg/dL 0.4   Alkaline Phos 38 - 126 U/L 213   AST 15 - 41 U/L 29   ALT 0 - 44 U/L 25    Edinburgh Score:    02/01/2024   11:00 AM  Edinburgh Postnatal Depression Scale Screening Tool  I have been able to laugh and see the funny side of things. 0  I have looked forward with enjoyment to things. 0  I have blamed myself unnecessarily when things went wrong. 0  I have been anxious or worried for no good reason. 0  I have felt scared or panicky for no good reason. 0  Things have been getting on top of me. 0  I have been so unhappy that I have had difficulty sleeping. 0  I have felt sad or miserable. 0  I have been so unhappy that I have been crying. 0  The thought of harming myself has occurred to me. 0  Edinburgh Postnatal Depression Scale Total 0   No data recorded  After visit meds:  Allergies as of 02/02/2024       Reactions   Diclofenac Hives        Medication List      STOP taking these medications    Accu-Chek Guide Me w/Device Kit   Accu-Chek Softclix Lancets lancets   acetaminophen-caffeine 500-65 MG Tabs per tablet Commonly known as: EXCEDRIN TENSION HEADACHE   Blood Pressure Monitor Misc   fluticasone 50 MCG/ACT nasal spray Commonly known as: FLONASE   furosemide 40 MG tablet Commonly known as: LASIX   glucose blood test strip   potassium chloride SA 20 MEQ tablet Commonly known as: Klor-Con M20   Prenatal Vitamin/Min +DHA 27-0.8-200 MG Caps       TAKE these medications    acetaminophen 325 MG tablet Commonly known as: Tylenol Take 2 tablets (650 mg total) by mouth every 6 (six) hours as needed (for pain scale < 4).   aspirin EC 81 MG tablet Take 2 tablets (162 mg total) by mouth daily. Swallow whole.   aspirin-acetaminophen-caffeine 250-250-65 MG tablet Commonly known as: EXCEDRIN MIGRAINE Take 2 tablets by mouth every 6 (six) hours as needed for headache.   atorvastatin 80 MG tablet Commonly known as: LIPITOR Take 1 tablet (80 mg total) by mouth daily.   enalapril 10 MG tablet Commonly known as: VASOTEC Take 0.5 tablets (5 mg total) by mouth daily.   labetalol 300 MG tablet Commonly known as: NORMODYNE Take 1 tablet (300 mg total) by mouth 3 (three) times daily. What changed:  medication strength how much to take   NIFEdipine 60 MG 24 hr tablet Commonly known as: ADALAT CC Take 1 tablet (60 mg total) by mouth 2 (two) times daily. What changed:  medication strength how much to take         Discharge home in stable condition Infant Feeding: Bottle Infant Disposition:home with mom Discharge instruction: per After Visit Summary and Postpartum booklet. Activity: Advance as tolerated. Pelvic rest for 6 weeks.  Diet: routine diet Future Appointments: Future Appointments  Date Time Provider Department Center  02/06/2024  2:20 PM Sonny Masters, FNP [facility] None  02/09/2024  3:00 PM Tobb, Lavona Mound, DO  [facility] None  03/13/2024  8:30 AM [facility]  03/13/2024  9:10 AM Cheral Marker, CNM [facility]   Follow up Visit:  Follow-up Information     [facility] for [facility]. Go in 1 week(s).   Specialty: Obstetrics and Gynecology Contact information: [address] [place] [address] [phone]               Message sent to FT 3/26  Please schedule this patient for a In person postpartum visit in 6 weeks with  the following provider: Any provider. Additional Postpartum F/U:2 hour GTT and BP check 2-3 days  High risk pregnancy complicated by:  Eduardo Osier w SF Delivery mode:  Vaginal, Spontaneous Anticipated Birth Control:  Unsure   02/04/2024 Sharon Seller, DO

## 2024-02-01 NOTE — Social Work (Signed)
 CSW received consult for hx of Anxiety.  CSW met with MOB to offer support and complete assessment.    CSW met with MOB at bedside and introduced CSW role. CSW congratulated MOB and FOB on baby boy, Jeri Modena. CSW observed MOB up in the room, the infant was asleep in the bassinet and FOB sitting on the couch at bedside. CSW offered MOB privacy and MOB gave CSW permission to share all information with FOB present. MOB presented calm and was receptive to CSW visit. CSW asked MOB how she had been doing since giving birth. MOB reported that she was doing well, and shared the birth went quickly. CSW inquired about MOB mental health history. MOB denied mental health history. CSW inquired about MOB noted history of anxiety. MOB replied, "that was forever ago" and denied current concerns. CSW asked MOB if she had PPD/anxiety symptoms with her older children. MOB denied PPD/anxiety.   CSW provided education regarding the baby blues period vs. perinatal mood disorders, discussed treatment and gave resources for mental health follow up if concerns arise. CSW recommended MOB complete a self-evaluation during the postpartum time period using the New Mom Checklist from Postpartum Progress and encouraged MOB to contact a medical professional if symptoms are noted at any time. CSW assessed MOB for safety. MOB denied SI/HI.   CSW inquired about MOB supports. MOB identified her significant other and daughters as supports. CSW asked MOB if she had chosen a pediatrician for the infant. MOB reported the infant would go to [facility]. CSW asked MOB if she had essential items to care for the infant. MOB reported she had essential items to care for the infant. CSW provided review of Sudden Infant Death Syndrome (SIDS) precautions.  MOB reported that she understood.    CSW identifies no further need for intervention and no barriers to discharge at this time.  Vivi Barrack, MSW, LCSW Clinical  Social Worker  [phone] 02/01/2024  11:26 AM

## 2024-02-01 NOTE — Plan of Care (Signed)

## 2024-02-01 NOTE — Progress Notes (Signed)
 Post Partum Day 1 Subjective: Patient is doing well without complaints. She has been ambulating, voiding and tolerating a regular diet. She denies HA, visual changes ot upper abdominal pain  Objective: Blood pressure (!) 147/84, pulse 79, temperature 98.2 F (36.8 C), temperature source Oral, resp. rate 17, height 5\' 9"  (1.753 m), weight 110.1 kg, last menstrual period 05/16/2023, SpO2 100%, unknown if currently breastfeeding.  Physical Exam:  General: alert, cooperative, and no distress Lochia: appropriate Uterine Fundus: firm Incision: N/A DVT Evaluation: No evidence of DVT seen on physical exam.  Recent Labs    01/31/24 0344 01/31/24 0812  HGB 14.7 13.2  HCT 43.9 39.6    Assessment/Plan: 43 yo PPD#1 s/p SVD with preeclampsia with severe features - Patient completed magnesium sulfate for seizure prophylaxis - BP stable on procardia and  lasix- continue to monitor closely - Continue routine postpartum care   LOS: 2 days   Catalina Antigua, MD 02/01/2024, 5:54 AM

## 2024-02-02 ENCOUNTER — Other Ambulatory Visit (HOSPITAL_COMMUNITY): Payer: Self-pay

## 2024-02-02 ENCOUNTER — Encounter

## 2024-02-02 ENCOUNTER — Other Ambulatory Visit: Payer: Medicaid Other

## 2024-02-02 MED ORDER — NIFEDIPINE ER OSMOTIC RELEASE 60 MG PO TB24
60.0000 mg | ORAL_TABLET | Freq: Two times a day (BID) | ORAL | Status: DC
  Administered 2024-02-02: 60 mg via ORAL
  Filled 2024-02-02: qty 1

## 2024-02-02 MED ORDER — FUROSEMIDE 40 MG PO TABS: 40.0000 mg | ORAL_TABLET | Freq: Every day | ORAL | Status: DC

## 2024-02-02 MED ORDER — NIFEDIPINE ER 60 MG PO TB24
60.0000 mg | ORAL_TABLET | Freq: Two times a day (BID) | ORAL | 0 refills | Status: DC
  Filled 2024-02-02: qty 60, 30d supply, fill #0

## 2024-02-02 MED ORDER — ATORVASTATIN CALCIUM 80 MG PO TABS
80.0000 mg | ORAL_TABLET | Freq: Every day | ORAL | 0 refills | Status: DC
  Filled 2024-02-02: qty 30, 30d supply, fill #0

## 2024-02-02 MED ORDER — ENALAPRIL MALEATE 10 MG PO TABS
5.0000 mg | ORAL_TABLET | Freq: Every day | ORAL | 0 refills | Status: DC
Start: 2024-02-02 — End: 2024-05-13
  Filled 2024-02-02: qty 15, 30d supply, fill #0

## 2024-02-02 MED ORDER — ENALAPRIL MALEATE 2.5 MG PO TABS
5.0000 mg | ORAL_TABLET | Freq: Every day | ORAL | Status: DC
  Administered 2024-02-02: 5 mg via ORAL
  Filled 2024-02-02: qty 2

## 2024-02-02 MED ORDER — LABETALOL HCL 300 MG PO TABS
300.0000 mg | ORAL_TABLET | Freq: Three times a day (TID) | ORAL | 0 refills | Status: DC
  Filled 2024-02-02: qty 90, 30d supply, fill #0

## 2024-02-02 MED ORDER — ACETAMINOPHEN 325 MG PO TABS: 650.0000 mg | ORAL_TABLET | Freq: Four times a day (QID) | ORAL | Status: DC | PRN

## 2024-02-02 NOTE — Progress Notes (Signed)
   02/02/24 1645  Departure Condition  Departure Condition Good  Mobility at [facility]  Patient/Caregiver Teaching Teach Back Method Used;Discharge instructions reviewed;Prescriptions reviewed;Follow-up care reviewed;Patient/caregiver verbalized understanding;Medications discussed;Pain management discussed;Educated about hypertension in pregnancy  Departure Mode With significant other (rooming in baby for duration of infant stay)  Was procedural sedation performed on this patient during this visit? No   Patient is alert and oriented x4, VS and pain stable for discharge.

## 2024-02-02 NOTE — Progress Notes (Signed)
 Post Partum Day 2 Subjective: Patient is without complaints. She is ambulating, voiding and tolerating a regular diet  Objective: Blood pressure (!) 158/88, pulse 72, temperature 97.7 F (36.5 C), temperature source Oral, resp. rate 16, height 5\' 9"  (1.753 m), weight 110.1 kg, last menstrual period 05/16/2023, SpO2 98%, unknown if currently breastfeeding.  Physical Exam:  General: alert, cooperative, and no distress Lochia: appropriate Uterine Fundus: firm Incision: N/A DVT Evaluation: No evidence of DVT seen on physical exam.  Recent Labs    01/31/24 0344 01/31/24 0812  HGB 14.7 13.2  HCT 43.9 39.6    Assessment/Plan: 43 yo G4P4 PPD#2 s/p SVD with preeclampsia with severe features - Patient completed magnesium sulfate - BP elevated on procardia 30 BID. Will increase procardia to 60 BID - Continue lasix - Consider discharge later today if remains stable with close outpatient follow up - Routine postpartum care   LOS: 3 days   Catalina Antigua, MD 02/02/2024, 7:16 AM

## 2024-02-03 LAB — TYPE AND SCREEN
ABO/RH(D): A POS
Antibody Screen: POSITIVE
Donor AG Type: NEGATIVE
Donor AG Type: NEGATIVE
Unit division: 0
Unit division: 0

## 2024-02-03 LAB — BPAM RBC
Blood Product Expiration Date: 202504122359
Blood Product Expiration Date: 202504132359
ISSUE DATE / TIME: 202503131316
Unit Type and Rh: 5100
Unit Type and Rh: 5100

## 2024-02-05 ENCOUNTER — Ambulatory Visit: Admitting: Nurse Practitioner

## 2024-02-05 ENCOUNTER — Encounter: Payer: Self-pay | Admitting: Nurse Practitioner

## 2024-02-05 VITALS — BP 113/79 | HR 83 | Temp 97.4°F | Ht 69.0 in | Wt 229.0 lb

## 2024-02-05 DIAGNOSIS — H6992 Unspecified Eustachian tube disorder, left ear: Secondary | ICD-10-CM

## 2024-02-05 MED ORDER — LORATADINE 10 MG PO TABS: 10.0000 mg | ORAL_TABLET | Freq: Every day | ORAL | 11 refills | Status: DC

## 2024-02-05 MED ORDER — FLUTICASONE PROPIONATE 50 MCG/ACT NA SUSP: 2.0000 | Freq: Every day | NASAL | 6 refills | Status: DC

## 2024-02-05 NOTE — Progress Notes (Signed)
 Subjective:    Patient ID: Kaitlin Torres, female    DOB: 04-26-1981, 43 y.o.   MRN: [id number]   Chief Complaint: Ear Pain (Left side/)   Just had a baby last week. Not breastfeeding.   Otalgia  There is pain in the left ear. This is a new problem. The current episode started in the past 7 days. The problem occurs constantly. The problem has been waxing and waning. There has been no fever. The pain is at a severity of 3/10. The pain is mild. Associated symptoms include coughing and rhinorrhea. She has tried NSAIDs for the symptoms. The treatment provided mild relief.      Patient Active Problem List   Diagnosis Date Noted   Severe preeclampsia 01/30/2024   Gestational diabetes 11/22/2023   Alpha thalassemia silent carrier 09/04/2023   Anti-E antibody 08/28/2023   Chronic hypertension affecting pregnancy 08/22/2023   AMA (advanced maternal age) multigravida 35+ 08/22/2023   Supervision of high-risk pregnancy 08/18/2023   Abnormal TSH 02/09/2021   Back pain without sciatica 11/04/2020   Iron deficiency anemia 09/08/2020   Essential hypertension 06/10/2019   Mixed hyperlipidemia 06/10/2019   GAD (generalized anxiety disorder) 06/10/2019   Vitamin D deficiency 06/10/2019   PCOS (polycystic ovarian syndrome) 06/10/2019   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   Respiratory:  Positive for cough.        Objective:   Physical Exam Constitutional:      Appearance: Normal appearance.  HENT:     Right Ear: Tympanic membrane normal.     Left Ear: A middle ear effusion is present.     Mouth/Throat:     Mouth: Mucous membranes are moist.  Eyes:     Extraocular Movements: Extraocular movements intact.     Pupils: Pupils are equal, round, and reactive to light.  Cardiovascular:     Rate and Rhythm: Normal rate and regular rhythm.     Heart  sounds: Normal heart sounds.  Pulmonary:     Breath sounds: Normal breath sounds.  Skin:    General: Skin is warm.  Neurological:     General: No focal deficit present.     Mental Status: She is alert and oriented to person, place, and time.  Psychiatric:        Mood and Affect: Mood normal.        Behavior: Behavior normal.     BP 113/79   Pulse 83   Temp (!) 97.4 F (36.3 C) (Temporal)   Ht 5\' 9"  (1.753 m)   Wt 229 lb (103.9 kg)   LMP 05/16/2023   SpO2 92%   Breastfeeding No   BMI 33.82 kg/m        Assessment & Plan:  Kaitlin Torres in today with chief complaint of Ear Pain (Left side/)   1. Eustachian tube dysfunction, left (Primary) Force fluids RTO prn - fluticasone (FLONASE) 50 MCG/ACT nasal spray; Place 2 sprays into both nostrils daily.  Dispense: 16 g; Refill: 6 - loratadine (CLARITIN) 10 MG tablet; Take 1 tablet (10 mg total) by mouth daily.  Dispense: 30 tablet; Refill: 11    The above assessment and management plan was discussed with the patient. The patient verbalized understanding of and has agreed to the management plan. Patient is aware to call the clinic if symptoms persist or worsen.  Patient is aware when to return to the clinic for a follow-up visit. Patient educated on when it is appropriate to go to the emergency department.   Mary-Margaret Daphine Deutscher, FNP

## 2024-02-05 NOTE — Patient Instructions (Signed)
Earache, Adult An earache, or ear pain, can be caused by many things, including: An infection. Ear wax buildup. Ear pressure. Something in the ear that should not be there (foreign body). A sore throat. Tooth problems. Jaw problems. Treatment of the earache will depend on the cause. If the cause is not clear or cannot be known, you may need to watch your symptoms until your earache goes away or until a cause is found. Follow these instructions at home: Medicines Take or apply over-the-counter and prescription medicines only as told by your health care provider. If you were prescribed antibiotics, use them as told by your health care provider. Do not stop using the antibiotic even if you start to feel better. Do not put anything in your ear other than medicine that is prescribed by your health care provider. Managing pain     If directed, apply heat to the affected area as often as told by your health care provider. Use the heat source that your health care provider recommends, such as a moist heat pack or a heating pad. Place a towel between your skin and the heat source. Leave the heat on for 20-30 minutes. If your skin turns bright red, remove the heat right away to prevent burns. The risk of burns is higher if you cannot feel pain, heat, or cold. If directed, put ice on the affected area. To do this: Put ice in a plastic bag. Place a towel between your skin and the bag. Leave the ice on for 20 minutes, 2-3 times a day. If your skin turns bright red, remove the ice right away to prevent skin damage. The risk of skin damage is higher if you cannot feel pain, heat, or cold.  General instructions Pay attention to any changes in your symptoms. Try resting in an upright position instead of lying down. This may help to reduce pressure in your ear and relieve pain. Chew gum if it helps to relieve your ear pain. Treat any allergies as told by your health care provider. Drink enough fluid  to keep your urine pale yellow. It is up to you to get the results of any tests that were done. Ask your health care provider, or the department that is doing the tests, when your results will be ready. Contact a health care provider if: Your pain does not improve within 2 days. Your earache gets worse. You have new symptoms. You have a fever. Get help right away if: You have a severe headache. You have a stiff neck. You have trouble swallowing. You have redness or swelling behind your ear. You have fluid or blood coming from your ear. You have hearing loss. You feel dizzy. This information is not intended to replace advice given to you by your health care provider. Make sure you discuss any questions you have with your health care provider. Document Revised: 03/07/2022 Document Reviewed: 03/07/2022 Elsevier Patient Education  2024 Elsevier Inc.  

## 2024-02-06 ENCOUNTER — Other Ambulatory Visit: Payer: Medicaid Other | Admitting: Radiology

## 2024-02-06 ENCOUNTER — Encounter: Payer: Medicaid Other | Admitting: Obstetrics & Gynecology

## 2024-02-06 ENCOUNTER — Ambulatory Visit: Admitting: Family Medicine

## 2024-02-07 ENCOUNTER — Ambulatory Visit

## 2024-02-07 VITALS — BP 109/73 | HR 81

## 2024-02-07 DIAGNOSIS — I10 Essential (primary) hypertension: Secondary | ICD-10-CM

## 2024-02-07 NOTE — Progress Notes (Signed)
   NURSE VISIT- BLOOD PRESSURE CHECK  SUBJECTIVE:  GESELLE Torres is a 43 y.o. [phone] female here for BP check. She is postpartum, delivery date 01/31/24     HYPERTENSION ROS:  Postpartum:  Severe headaches that don't go away with tylenol/other medicines: No  Visual changes (seeing spots/double/blurred vision) No  Severe pain under right breast breast or in center of upper chest No  Severe nausea/vomiting No  Taking medicines as instructed yes   OBJECTIVE:  BP 109/73 (BP Location: Right Arm, Patient Position: Sitting, Cuff Size: Normal)   Pulse 81   LMP 05/16/2023   Breastfeeding No   Appearance alert, well appearing, and in no distress.  ASSESSMENT: Postpartum  blood pressure check  PLAN: Discussed with Dr. Charlotta Newton   Recommendations: no changes needed   Follow-up: as scheduled   Jobe Marker  02/07/2024 10:21 AM

## 2024-02-09 ENCOUNTER — Encounter: Payer: Self-pay | Admitting: Cardiology

## 2024-02-09 ENCOUNTER — Other Ambulatory Visit: Payer: Medicaid Other

## 2024-02-09 ENCOUNTER — Ambulatory Visit (INDEPENDENT_AMBULATORY_CARE_PROVIDER_SITE_OTHER): Admitting: Cardiology

## 2024-02-09 VITALS — BP 128/86 | HR 79 | Ht 69.0 in | Wt 225.7 lb

## 2024-02-09 DIAGNOSIS — Z720 Tobacco use: Secondary | ICD-10-CM | POA: Diagnosis not present

## 2024-02-09 DIAGNOSIS — I2510 Atherosclerotic heart disease of native coronary artery without angina pectoris: Secondary | ICD-10-CM

## 2024-02-09 DIAGNOSIS — E782 Mixed hyperlipidemia: Secondary | ICD-10-CM | POA: Diagnosis not present

## 2024-02-09 DIAGNOSIS — R600 Localized edema: Secondary | ICD-10-CM

## 2024-02-09 DIAGNOSIS — I10 Essential (primary) hypertension: Secondary | ICD-10-CM | POA: Diagnosis not present

## 2024-02-09 DIAGNOSIS — Z79899 Other long term (current) drug therapy: Secondary | ICD-10-CM | POA: Diagnosis not present

## 2024-02-09 MED ORDER — FUROSEMIDE 40 MG PO TABS: 40.0000 mg | ORAL_TABLET | Freq: Every day | ORAL | 0 refills | Status: DC

## 2024-02-09 MED ORDER — POTASSIUM CHLORIDE CRYS ER 20 MEQ PO TBCR: 20.0000 meq | EXTENDED_RELEASE_TABLET | Freq: Every day | ORAL | 0 refills | Status: DC

## 2024-02-09 NOTE — Patient Instructions (Signed)
 Medication Instructions:  Your physician has recommended you make the following change in your medication:  For 5 days: Lasix 40 mg once daily For 5 days: Potassium 20 mEq once daily *If you need a refill on your cardiac medications before your next appointment, please call your pharmacy*  Lab Work: CMET, Mag If you have labs (blood work) drawn today and your tests are completely normal, you will receive your results only by: MyChart Message (if you have MyChart) OR A paper copy in the mail If you have any lab test that is abnormal or we need to change your treatment, we will call you to review the results.   Follow-Up: At [facility], you and your health needs are our priority.  As part of our continuing mission to provide you with exceptional heart care, our providers are all part of one team.  This team includes your primary Cardiologist (physician) and Advanced Practice Providers or APPs (Physician Assistants and Nurse Practitioners) who all work together to provide you with the care you need, when you need it.  Your next appointment:   12-16 week(s)  Provider:   Thomasene Ripple, DO     Other Instructions   1st Floor: - Lobby - Registration  - Pharmacy  - Lab - Cafe  2nd Floor: - PV Lab - Diagnostic Testing (echo, CT, nuclear med)  3rd Floor: - Vacant  4th Floor: - TCTS (cardiothoracic surgery) - AFib Clinic - Structural Heart Clinic - Vascular Surgery  - Vascular Ultrasound  5th Floor: - HeartCare Cardiology (general and EP) - Clinical Pharmacy for coumadin, hypertension, lipid, weight-loss medications, and med management appointments    Valet parking services will be available as well.

## 2024-02-10 NOTE — Progress Notes (Signed)
 Cardio-Obstetrics Clinic  New Evaluation  Date:  02/10/2024   ID:  Kaitlin Torres, DOB 29-Oct-1981, MRN [id number]  PCP:  Sonny Masters, FNP   [facility] Providers Cardiologist:  Thomasene Ripple, DO  Electrophysiologist:  None       Referring MD: Sonny Masters, FNP   Chief Complaint: " I am ok"  History of Present Illness:    Kaitlin Torres is a 43 y.o. female [[id number]] who is being seen today in follow up - post partum.   Medical hx include CAD s/p PCI to the RCA and OM vessels in 2020, Familial hypercholesteremia, obesity, Tobacco use/vapes, obesity, hypertension.   She reports that she is not breastfeeding because she is still vaping.   She is experiencing worsening leg swelling since her delivery.    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  Fibrocystic change.  Usual duct  epithelial hyperplasia.   CORONARY STENT INTERVENTION N/A 05/08/2019   Procedure: CORONARY STENT INTERVENTION;  Surgeon: Swaziland, Peter M, MD;  Location: [facility] INVASIVE CV LAB;  Service: Cardiovascular;  Laterality: N/A;   FRACTURE SURGERY  2005   right ankle   HEMORRHOID SURGERY     HEMORRHOID SURGERY  2015   INCISION AND DRAINAGE BREAST ABSCESS  09/2010   left breast    LEFT HEART CATH AND CORONARY ANGIOGRAPHY N/A 05/08/2019   Procedure: LEFT HEART CATH AND CORONARY ANGIOGRAPHY;  Surgeon: Swaziland, Peter M, MD;  Location: [facility] INVASIVE CV LAB;  Service:  Cardiovascular;  Laterality: N/A;      OB History     Gravida  4   Para  4   Term  4   Preterm      AB      Living  4      SAB      IAB      Ectopic      Multiple  0   Live Births  4               Current Medications: Current Meds  Medication Sig   aspirin EC 81 MG tablet Take 2 tablets (162 mg total) by mouth daily. Swallow whole.   aspirin-acetaminophen-caffeine (EXCEDRIN MIGRAINE) 250-250-65 MG tablet Take 2 tablets by mouth every 6 (six) hours as needed for headache.   atorvastatin (LIPITOR) 80 MG tablet Take 1 tablet (80 mg total) by mouth daily.   enalapril (VASOTEC) 10 MG tablet Take 0.5 tablets (5 mg total) by mouth daily.   fluticasone (FLONASE) 50 MCG/ACT nasal spray Place 2 sprays into both nostrils daily.   furosemide (LASIX) 40 MG tablet Take 1 tablet (40 mg total) by mouth daily for 5 days.   labetalol (NORMODYNE) 300 MG tablet Take 1 tablet (300 mg total) by mouth 3 (three) times daily.   loratadine (CLARITIN) 10 MG tablet Take 1 tablet (10 mg total) by mouth  daily.   NIFEdipine (ADALAT CC) 60 MG 24 hr tablet Take 1 tablet (60 mg total) by mouth 2 (two) times daily.   potassium chloride SA (KLOR-CON M20) 20 MEQ tablet Take 1 tablet (20 mEq total) by mouth daily for 5 days.     Allergies:   Diclofenac   Social History   Socioeconomic History   Marital status: Single    Spouse name: Not on file   Number of children: 3   Years of education: 14   Highest education level: Associate degree: academic program  Occupational History   Occupation: Waitress  Tobacco Use   Smoking status: Former    Current packs/day: 0.00    Average packs/day: 0.1 packs/day for 26.0 years (2.6 ttl pk-yrs)    Types: Cigarettes    Start date: 09/15/1993    Quit date: 09/16/2019    Years since quitting: 4.4   Smokeless tobacco: Never  Vaping Use   Vaping status: Every Day   Substances: Nicotine, Flavoring  Substance and Sexual Activity   Alcohol use: No     Comment: quit 2018   Drug use: No   Sexual activity: Yes    Birth control/protection: None  Other Topics Concern   Not on file  Social History Narrative   Lives at home with her mother, husband and three daughters.   Right-handed.   Approximately 20 cups caffeine per day (4-5 glasses holding 32oz of tea).   Social Drivers of Health   Financial Resource Strain: Medium Risk (07/06/2023)   Overall Financial Resource Strain (CARDIA)    Difficulty of Paying Living Expenses: Somewhat hard  Food Insecurity: No Food Insecurity (01/30/2024)   Hunger Vital Sign    Worried About Running Out of Food in the Last Year: Never true    Ran Out of Food in the Last Year: Never true  Transportation Needs: No Transportation Needs (01/30/2024)   PRAPARE - Administrator, Civil Service (Medical): No    Lack of Transportation (Non-Medical): No  Physical Activity: Sufficiently Active (07/06/2023)   Exercise Vital Sign    Days of Exercise per Week: 5 days    Minutes of Exercise per Session: 90 min  Stress: No Stress Concern Present (07/06/2023)   [employer] of Occupational Health - Occupational Stress Questionnaire    Feeling of Stress : Only a little  Social Connections: Moderately Isolated (07/06/2023)   Social Connection and Isolation Panel [NHANES]    Frequency of Communication with Friends and Family: More than three times a week    Frequency of Social Gatherings with Friends and Family: More than three times a week    Attends Religious Services: Never    Database administrator or Organizations: No    Attends Engineer, structural: Never    Marital Status: Living with partner      Family History  Problem Relation Age of Onset   Hypertension Mother    Peripheral Artery Disease Mother    Heart disease Father        Cardiac arrest 2006   Diabetes Father    Hyperlipidemia Father    Stroke Father    Aneurysm Brother    Cervical cancer Maternal Grandmother    Diabetes  Paternal Grandmother       ROS:   Please see the history of present illness.     All other systems reviewed and are negative.   Labs/EKG Reviewed:    EKG:   EKG was ordered today.  The ekg ordered today demonstrates sinus rhythm, heart rate 96 bpm  Recent Labs: 01/31/2024: ALT 25; BUN 10; Creatinine, Ser 0.73; Hemoglobin 13.2; Magnesium 4.6; Platelets 242; Potassium 4.0; Sodium 133   Recent Lipid Panel Lab Results  Component Value Date/Time   CHOL 202 (H) 11/01/2022 09:49 AM   TRIG 103 11/01/2022 09:49 AM   HDL 36 (L) 11/01/2022 09:49 AM   CHOLHDL 5.6 (H) 11/01/2022 09:49 AM   CHOLHDL 10.0 05/09/2019 03:57 AM   LDLCALC 147 (H) 11/01/2022 09:49 AM    Physical Exam:    VS:  BP 128/86 (BP Location: Left Arm, Patient Position: Sitting, Cuff Size: Normal)   Pulse 79   Ht 5\' 9"  (1.753 m)   Wt 225 lb 11.2 oz (102.4 kg)   LMP 05/16/2023   SpO2 98%   BMI 33.33 kg/m     Wt Readings from Last 3 Encounters:  02/09/24 225 lb 11.2 oz (102.4 kg)  02/05/24 229 lb (103.9 kg)  01/30/24 242 lb 11.2 oz (110.1 kg)     GEN:  Well nourished, well developed in no acute distress HEENT: Normal NECK: No JVD; No carotid bruits LYMPHATICS: No lymphadenopathy CARDIAC: RRR, no murmurs, rubs, gallops RESPIRATORY:  Clear to auscultation without rales, wheezing or rhonchi  ABDOMEN: Soft, non-tender, non-distended MUSCULOSKELETAL:  No edema; No deformity  SKIN: Warm and dry NEUROLOGIC:  Alert and oriented x 3 PSYCHIATRIC:  Normal affect    Risk Assessment/Risk Calculators:                 ASSESSMENT & PLAN:    Coronary artery disease (CAD) - no angina symptoms. She is not breastfeeding. Her statin has been restarted . Continue Aspirin 81 mg daily and Lipitor 80 mg daily.. Please wait until 6 months post partum before rechecking her lipid profile.   Chronic hypertension - Blood pressure acceptable despite missed medication. Community health worker involvement planned. - Continue  current antihypertensive regimen: currently on enalapril 10 mg daily, labetalol 300 mg TID, nifedipine 60 mg BID.  Attempt to titrate off medication - at 12 weeks. She does have chronic hypertension and may need to be on one or two antihypertensive - for now only time will tell.   - Schedule follow-up with pharmacist Thayer Ohm in 4-6 weeks to monitor blood pressure and adjust medications if necessary.  Postpartum fluid retention -  Common after childbirth. Not breastfeeding, allowing medication flexibility. Lasix prescribed with potassium supplementation. - Prescribe Lasix for 5 days to manage fluid retention. - Advise taking potassium supplements with applesauce, yogurt, or pudding to prevent pill from getting stuck in throat. - Monitor fluid status and adjust treatment as necessary.  Follow-up Necessary to monitor condition and adjust treatment plans. - Schedule follow-up appointment with pharmacist Dr Laural Golden in 4-6 weeks. - Schedule follow-up appointment with cardiologist in 12-16 weeks. - Consider lab work to assess renal function if lab is open.   Tobacco use - cessation advised.    Patient Instructions  Medication Instructions:  Your physician has recommended you make the following change in your medication:  For 5 days: Lasix 40 mg once daily For 5 days: Potassium 20 mEq once daily *If you need a refill on your cardiac medications before your next appointment, please call your pharmacy*  Lab Work: CMET, Mag If you have labs (blood work) drawn today and your tests are completely normal, you will receive your results only by: MyChart Message (if you have MyChart) OR A paper copy in the mail  If you have any lab test that is abnormal or we need to change your treatment, we will call you to review the results.   Follow-Up: At [facility], you and your health needs are our priority.  As part of our continuing mission to provide you with exceptional heart care, our  providers are all part of one team.  This team includes your primary Cardiologist (physician) and Advanced Practice Providers or APPs (Physician Assistants and Nurse Practitioners) who all work together to provide you with the care you need, when you need it.  Your next appointment:   12-16 week(s)  Provider:   Thomasene Ripple, DO     Other Instructions   1st Floor: - Lobby - Registration  - Pharmacy  - Lab - Cafe  2nd Floor: - PV Lab - Diagnostic Testing (echo, CT, nuclear med)  3rd Floor: - Vacant  4th Floor: - TCTS (cardiothoracic surgery) - AFib Clinic - Structural Heart Clinic - Vascular Surgery  - Vascular Ultrasound  5th Floor: - HeartCare Cardiology (general and EP) - Clinical Pharmacy for coumadin, hypertension, lipid, weight-loss medications, and med management appointments    Valet parking services will be available as well.           Dispo:  No follow-ups on file.   Medication Adjustments/Labs and Tests Ordered: Current medicines are reviewed at length with the patient today.  Concerns regarding medicines are outlined above.  Tests Ordered: Orders Placed This Encounter  Procedures   Comp Met (CMET)   Magnesium   Medication Changes: Meds ordered this encounter  Medications   furosemide (LASIX) 40 MG tablet    Sig: Take 1 tablet (40 mg total) by mouth daily for 5 days.    Dispense:  5 tablet    Refill:  0   potassium chloride SA (KLOR-CON M20) 20 MEQ tablet    Sig: Take 1 tablet (20 mEq total) by mouth daily for 5 days.    Dispense:  5 tablet    Refill:  0

## 2024-02-13 ENCOUNTER — Encounter: Payer: Medicaid Other | Admitting: Obstetrics & Gynecology

## 2024-02-13 ENCOUNTER — Other Ambulatory Visit: Payer: Medicaid Other | Admitting: Radiology

## 2024-02-16 ENCOUNTER — Other Ambulatory Visit: Payer: Medicaid Other

## 2024-02-19 DIAGNOSIS — G5602 Carpal tunnel syndrome, left upper limb: Secondary | ICD-10-CM | POA: Diagnosis not present

## 2024-02-19 DIAGNOSIS — G5601 Carpal tunnel syndrome, right upper limb: Secondary | ICD-10-CM | POA: Diagnosis not present

## 2024-02-19 DIAGNOSIS — G5603 Carpal tunnel syndrome, bilateral upper limbs: Secondary | ICD-10-CM | POA: Diagnosis not present

## 2024-02-19 DIAGNOSIS — M79642 Pain in left hand: Secondary | ICD-10-CM | POA: Diagnosis not present

## 2024-02-19 DIAGNOSIS — M79641 Pain in right hand: Secondary | ICD-10-CM | POA: Diagnosis not present

## 2024-03-04 DIAGNOSIS — G5603 Carpal tunnel syndrome, bilateral upper limbs: Secondary | ICD-10-CM | POA: Diagnosis not present

## 2024-03-13 ENCOUNTER — Other Ambulatory Visit

## 2024-03-13 ENCOUNTER — Ambulatory Visit: Admitting: Women's Health

## 2024-03-13 ENCOUNTER — Encounter: Payer: Self-pay | Admitting: Women's Health

## 2024-03-13 DIAGNOSIS — Z1331 Encounter for screening for depression: Secondary | ICD-10-CM

## 2024-03-13 DIAGNOSIS — I10 Essential (primary) hypertension: Secondary | ICD-10-CM

## 2024-03-13 DIAGNOSIS — Z131 Encounter for screening for diabetes mellitus: Secondary | ICD-10-CM

## 2024-03-13 DIAGNOSIS — Z8632 Personal history of gestational diabetes: Secondary | ICD-10-CM | POA: Diagnosis not present

## 2024-03-13 DIAGNOSIS — Z30018 Encounter for initial prescription of other contraceptives: Secondary | ICD-10-CM

## 2024-03-13 MED ORDER — PHEXXI 1.8-1-0.4 % VA GEL: 1.0000 | Freq: Once | VAGINAL | 11 refills | Status: AC

## 2024-03-13 NOTE — Progress Notes (Signed)
 POSTPARTUM VISIT Patient name: Kaitlin Torres MRN [id number]  Date of birth: Dec 31, 1980 Chief Complaint:   Postpartum Care (Want pills,had sex 3 day ago)  History of Present Illness:   Kaitlin Torres is a 43 y.o. [phone] female being seen today for a postpartum visit. She is 6 weeks postpartum following a spontaneous vaginal delivery at 37.1 gestational weeks. IOL: yes, for chronic hypertension  and diabetes mellitus A1DM. Anesthesia: epidural.  Laceration: none.  Complications: pp pre-e, tx w/ Mag, lasix  and multiple meds. Inpatient contraception: no.   Pregnancy complicated by [facility], h/o MI, A1DM, AMA, followed by OB cards . Tobacco use: no. Substance use disorder: no. Last pap smear: 09/20/23 and results were NILM w/ HRHPV negative. Next pap smear due: 2027 Patient's last menstrual period was 05/16/2023.  Postpartum course has been complicated by [facility] w/ pp severe pre-e, being followed by OB cards for bp management (currently on enalapril , labetalol , nifedipine ) . Bleeding none. Bowel function is normal. Bladder function is normal. Urinary incontinence? no, fecal incontinence? no Patient is sexually active. Last sexual activity:  3d ago . Desired contraception: wants pills, discussed w/ her h/o MI per [facility] POPs Cat 3, only Cat 1 is Paragard- does not want. Discussed Phexxi, wants to try. Patient IS CONSIDERING pregnancy in the future. Discussed this is not recommended w/ her hx, to talk w/ cardiology about this at upcoming appointment. Desired family size is 5 children.   Upstream - 03/13/24 0910       Pregnancy Intention Screening   Does the patient want to become pregnant in the next year? Unsure    Does the patient's partner want to become pregnant in the next year? Unsure    Would the patient like to discuss contraceptive options today? Yes      Contraception Wrap Up   Current Method Oral Contraceptive    End Method Oral Contraceptive    Contraception Counseling Provided Yes             The pregnancy intention screening data noted above was reviewed. Potential methods of contraception were discussed. The patient elected to proceed with Oral Contraceptive.  Edinburgh Postpartum Depression Screening: negative  Edinburgh Postnatal Depression Scale - 03/13/24 0906       Edinburgh Postnatal Depression Scale:  In the Past 7 Days   I have been able to laugh and see the funny side of things. 0    I have looked forward with enjoyment to things. 0    I have blamed myself unnecessarily when things went wrong. 0    I have been anxious or worried for no good reason. 0    I have felt scared or panicky for no good reason. 0    Things have been getting on top of me. 0    I have been so unhappy that I have had difficulty sleeping. 0    I have felt sad or miserable. 0    I have been so unhappy that I have been crying. 0    The thought of harming myself has occurred to me. 0    Edinburgh Postnatal Depression Scale Total 0                08/22/2023    2:45 PM 07/06/2023    3:48 PM 12/22/2022    3:56 PM 11/01/2022    9:37 AM  GAD 7 : Generalized Anxiety Score  Nervous, Anxious, on Edge 0 0 0   Control/stop worrying 0 0  0   Worry too much - different things 0 0 0   Trouble relaxing 0 0 0   Restless 0 0 1 1  Easily annoyed or irritable 0 2 0   Afraid - awful might happen 0 0 0   Total GAD 7 Score 0 2 1   Anxiety Difficulty   Not difficult at all    Baby's course has been uncomplicated. Baby is feeding by bottle. Infant has a pediatrician/family doctor? Yes.  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  Pt has material needs met for her and baby: Yes.   Review of Systems:   Pertinent items are noted in HPI Denies Abnormal vaginal discharge w/ itching/odor/irritation, headaches, visual changes, shortness of breath, chest pain, abdominal pain, severe nausea/vomiting, or problems with urination or bowel  movements. Pertinent History Reviewed:  Reviewed past medical,surgical, obstetrical and family history.  Reviewed problem list, medications and allergies. OB History  Gravida Para Term Preterm AB Living  4 4 4   4   SAB IAB Ectopic Multiple Live Births     0 4    # Outcome Date GA Lbr Len/2nd Weight Sex Type Anes PTL Lv  4 Term 01/31/24 [redacted]w[redacted]d 23:39 / 01:00 5 lb 3.3 oz (2.36 kg) M Vag-Spont EPI  LIV  3 Term 2010   6 lb (2.722 kg) F Vag-Spont   LIV  2 Term 2004   6 lb (2.722 kg) F Vag-Spont   LIV  1 Term 2002   6 lb (2.722 kg) F Vag-Spont  N LIV   Physical Assessment:   Vitals:   03/13/24 0857 03/13/24 0920  BP: 137/89 130/86  Pulse: 73 73  Weight: 233 lb 4.8 oz (105.8 kg)   Height: 5\' 9"  (1.753 m)   Body mass index is 34.45 kg/m.       Physical Examination:   General appearance: alert, well appearing, and in no distress  Mental status: alert, oriented to person, place, and time  Skin: warm & dry   Cardiovascular: normal heart rate noted   Respiratory: normal respiratory effort, no distress   Breasts: deferred, no complaints   Abdomen: soft, non-tender   Pelvic: examination not indicated. Thin prep pap obtained: No  Rectal: not examined  Extremities: Edema: none   Chaperone: N/A       No results found for this or any previous visit (from the past 24 hours).  Assessment & Plan:  1) Postpartum exam 2) 6 wks s/p spontaneous vaginal delivery after IOL for A1DM/CHTN 3) bottle feeding 4) Depression screening 5) Contraception management: rx Phexxi to [employer], discussed use. Declines Paragard. 6) CHTN w/ superimposed severe pre-e pp> HTN being managed by OB cards, has f/u this week 7) A1DM> wasn't fasting this am, return this week for fasting 2hr GTT 8) Considering another pregnancy> strongly advised against this d/t h/o MI and severe HTN, pt to talk w/ OB cards at f/u appt this week  Essential components of care per ACOG recommendations:  1.  Mood and well being:  If  positive depression screen, discussed and plan developed.  If using tobacco we discussed reduction/cessation and risk of relapse If current substance abuse, we discussed and referral to local resources was offered.   2. Infant care and feeding:  If breastfeeding, discussed returning to work, pumping, breastfeeding-associated pain, guidance regarding return to fertility while lactating if not using another method. If needed, patient was  provided with a letter to be allowed to pump q 2-3hrs to support lactation in a private location with access to a refrigerator to store breastmilk.   Recommended that all caregivers be immunized for flu, pertussis and other preventable communicable diseases If pt does not have material needs met for her/baby, referred to local resources for help obtaining these.  3. Sexuality, contraception and birth spacing Provided guidance regarding sexuality, management of dyspareunia, and resumption of intercourse Discussed avoiding interpregnancy interval <35mths and recommended birth spacing of 18 months  4. Sleep and fatigue Discussed coping options for fatigue and sleep disruption Encouraged family/partner/community support of 4 hrs of uninterrupted sleep to help with mood and fatigue  5. Physical recovery  If pt had a C/S, assessed incisional pain and providing guidance on normal vs prolonged recovery If pt had a laceration, perineal healing and pain reviewed.  If urinary or fecal incontinence, discussed management and referred to PT or uro/gyn if indicated  Patient is safe to resume physical activity. Discussed attainment of healthy weight.  6.  Chronic disease management Discussed pregnancy complications if any, and their implications for future childbearing and long-term maternal health. Review recommendations for prevention of recurrent pregnancy complications, such as 17 hydroxyprogesterone caproate to reduce risk for recurrent PTB not applicable, or aspirin  to  reduce risk of preeclampsia: did not discuss. Pt had GDM: yes. If yes, 2hr GTT scheduled: not fasting today, scheduled to return. Reviewed medications and non-pregnant dosing including consideration of whether pt is breastfeeding using a reliable resource such as LactMed: not applicable Referred for f/u w/ PCP or subspecialist providers as indicated: yes  7. Health maintenance Mammogram at 43yo or earlier if indicated Pap smears as indicated  Meds:  Meds ordered this encounter  Medications   Lactic Ac-Citric Ac-Pot Bitart (PHEXXI) 1.8-1-0.4 % GEL    Sig: Place 1 Applicatorful vaginally once for 1 dose. Up to 1 hour before sex    Dispense:  180 g    Refill:  11    Follow-up: Return for asap pp sugar test (no visit), then 64yr for physical.   No orders of the defined types were placed in this encounter.   Ferd Householder CNM, [facility] 03/13/2024 10:06 AM

## 2024-03-13 NOTE — Patient Instructions (Signed)
You will have your sugar test next visit.  Please do not eat or drink anything after midnight the night before you come, not even water.  You will be here for at least two hours.  Please make an appointment online for the bloodwork at [email or web address] for 8:30am (or as close to this as possible). Make sure you select the [address] service center. The day of the appointment, check in with our office first, then you will go to [facility] to start the sugar test.   

## 2024-03-15 ENCOUNTER — Ambulatory Visit: Admitting: Pharmacist

## 2024-03-15 NOTE — Progress Notes (Deleted)
 Patient ID: Kaitlin Torres                 DOB: [birth year]                      MRN: [id number]     HPI: Kaitlin Torres is a 43 y.o. female [phone]) referred by Dr. Emmette Harms to HTN clinic. Delivered a boy via vagainal delivery on 01/31/24 at [redacted]w[redacted]d given cHTN and GDM. Postpartum course was complicated by cHTN with superimposed preeclampsia treated with IV magnesium , fuosemide, and anti-hypertensives.  PMH is also significant for CAD with NSTEMI in July 2020 s/p stenting of RCA and LCx/OM, PCOS, familial HLD, prediabetes, anxiety, BMI > 34. Echo 10/2023 EF 60-65%.  She was seen by Dr. Emmette Harms on 02/09/24. She reported that she is not breastfeeding because she still vapes. She had experienced worsened LE swelling since her delivery. BP was 128/86 mmHg, HR 79. Current antihypertensive regimen was continued. Planned to attempt to titrate off some of her antihypertensives at 12 weeks postpartum. For swelling, she was prescribed Lasix  40 mg x5 days with potassium. At routine postpartum visit with OBGYN on 03/13/24. BP was 130/86, HR 73.   Contraception: wanted pills > w/ h/o MI per [facility] POPs cat 3, only cat 1 is paragard > discussed phexxi . Patient is considering pregnancy in the future - OBGYN advised against thist.  Plan - lab work for renal fx? After starting enalapril  - tobacco use - BP - no lipid panel until 6 weeks postpartum - adjust BP meds > switch labetalol  to something more effective? > keep on BB with cardiac hx  Current HTN meds: enalapril  10 mg daily, labetalol  300 mg TID, nifedipine  60 mg BID  Previously tried:  BP goal: <130/80 mmHg  Current ASCVD Risk Reduction meds: atorvastatin  80 mg daily, aspirin  81 mg daily    Family History:   Social History:  Insurance: Medicaid  Diet:   Exercise:   Home BP readings:   Wt Readings from Last 3 Encounters:  03/13/24 233 lb 4.8 oz (105.8 kg)  02/09/24 225 lb 11.2 oz (102.4 kg)  02/05/24 229 lb (103.9 kg)   BP Readings from Last 3 Encounters:   03/13/24 130/86  02/09/24 128/86  02/07/24 109/73   Pulse Readings from Last 3 Encounters:  03/13/24 73  02/09/24 79  02/07/24 81    Renal function: CrCl cannot be calculated (Patient's most recent lab result is older than the maximum 21 days allowed.).  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 (Patient not taking: Reported on 02/07/2024)     aspirin  EC 81 MG tablet Take 2 tablets (162 mg total) by mouth daily. Swallow whole. 180 tablet 2   aspirin -acetaminophen -caffeine  (EXCEDRIN MIGRAINE) 250-250-65 MG tablet Take 2 tablets by mouth every 6 (six) hours as needed for headache.     atorvastatin  (LIPITOR ) 80 MG tablet Take 1 tablet (80 mg total) by mouth  daily. 30 tablet 0   enalapril  (VASOTEC ) 10 MG tablet Take 0.5 tablets (5 mg total) by mouth daily. 15 tablet 0   fluticasone  (FLONASE ) 50 MCG/ACT nasal spray Place 2 sprays into both nostrils daily. 16 g 6   furosemide  (LASIX ) 40 MG tablet Take 1 tablet (40 mg total) by mouth daily for 5 days. 5 tablet 0   labetalol  (NORMODYNE ) 300 MG tablet Take 1 tablet (300 mg total) by mouth 3 (three) times daily. 90 tablet 0   loratadine  (CLARITIN ) 10 MG tablet Take 1 tablet (10 mg total) by mouth daily. 30 tablet 11   NIFEdipine  (ADALAT  CC) 60 MG 24 hr tablet Take 1 tablet (60 mg total) by mouth 2 (two) times daily. 60 tablet 0   potassium chloride  SA (KLOR-CON  M20) 20 MEQ tablet Take 1 tablet (20 mEq total) by mouth daily for 5 days. 5 tablet 0   No current facility-administered  medications on file prior to visit.    Allergies  Allergen Reactions   Diclofenac  Hives     Assessment/Plan:  1. Hypertension -

## 2024-03-18 ENCOUNTER — Other Ambulatory Visit

## 2024-03-18 DIAGNOSIS — Z8632 Personal history of gestational diabetes: Secondary | ICD-10-CM

## 2024-03-21 DIAGNOSIS — G5602 Carpal tunnel syndrome, left upper limb: Secondary | ICD-10-CM | POA: Diagnosis not present

## 2024-03-22 ENCOUNTER — Other Ambulatory Visit

## 2024-03-22 DIAGNOSIS — Z8632 Personal history of gestational diabetes: Secondary | ICD-10-CM

## 2024-03-22 DIAGNOSIS — Z131 Encounter for screening for diabetes mellitus: Secondary | ICD-10-CM

## 2024-03-23 LAB — GLUCOSE TOLERANCE, 2 HOURS W/ 1HR
Glucose, 1 hour: 171 mg/dL (ref 70–179)
Glucose, 2 hour: 69 mg/dL — ABNORMAL LOW (ref 70–152)
Glucose, Fasting: 95 mg/dL — ABNORMAL HIGH (ref 70–91)

## 2024-03-25 ENCOUNTER — Other Ambulatory Visit: Payer: Self-pay | Admitting: Women's Health

## 2024-03-25 ENCOUNTER — Ambulatory Visit: Payer: Self-pay | Admitting: Women's Health

## 2024-03-25 DIAGNOSIS — Z8632 Personal history of gestational diabetes: Secondary | ICD-10-CM

## 2024-03-25 DIAGNOSIS — R7302 Impaired glucose tolerance (oral): Secondary | ICD-10-CM

## 2024-04-02 ENCOUNTER — Ambulatory Visit: Admitting: Family Medicine

## 2024-04-04 ENCOUNTER — Other Ambulatory Visit: Payer: Self-pay | Admitting: Medical Genetics

## 2024-04-05 DIAGNOSIS — G5602 Carpal tunnel syndrome, left upper limb: Secondary | ICD-10-CM | POA: Insufficient documentation

## 2024-04-10 ENCOUNTER — Ambulatory Visit: Payer: Self-pay | Admitting: Family Medicine

## 2024-04-10 ENCOUNTER — Encounter: Payer: Self-pay | Admitting: Family Medicine

## 2024-04-10 ENCOUNTER — Ambulatory Visit: Admitting: Family Medicine

## 2024-04-10 DIAGNOSIS — E782 Mixed hyperlipidemia: Secondary | ICD-10-CM

## 2024-04-10 DIAGNOSIS — E559 Vitamin D deficiency, unspecified: Secondary | ICD-10-CM | POA: Diagnosis not present

## 2024-04-10 DIAGNOSIS — Z8632 Personal history of gestational diabetes: Secondary | ICD-10-CM | POA: Diagnosis not present

## 2024-04-10 DIAGNOSIS — I10 Essential (primary) hypertension: Secondary | ICD-10-CM

## 2024-04-10 LAB — BAYER DCA HB A1C WAIVED: HB A1C (BAYER DCA - WAIVED): 5.4 % (ref 4.8–5.6)

## 2024-04-10 LAB — LIPID PANEL

## 2024-04-10 NOTE — Progress Notes (Addendum)
 Subjective:  Patient ID: Kaitlin Torres, female    DOB: 06/05/1981, 43 y.o.   MRN: [id number]  Patient Care Team: Galvin Jules, FNP as PCP - General (Family Medicine) Tobb, Kardie, DO as PCP - Cardiology (Cardiology)   Chief Complaint:  Weight Loss (Options )   HPI: Kaitlin Torres is a 43 y.o. female presenting on 04/10/2024 for Weight Loss (Options )   Kaitlin Torres is a 42 year old female who presents for weight management and elevated A1c.  She has been experiencing weight gain since her pregnancy and is concerned about her elevated A1c levels. She has been attempting weight loss by adopting a heart-healthy diet and reducing carbohydrate intake. During her pregnancy, she had cravings for carbohydrates. Her current weight is 237 pounds, and her BMI is 35, indicating obesity.  She is currently taking labetalol , nifedipine , and enalapril  for hypertension but missed doses last night and today due to falling asleep with her baby. She has a history of gestational diabetes, and her last A1c test on May 19th showed elevated levels.  She inquires if her work activity, which involves walking 15,000 steps a day, can be considered exercise.          Relevant past medical, surgical, family, and social history reviewed and updated as indicated.  Allergies and medications reviewed and updated. Data reviewed: Chart in Epic.   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  Fibrocystic change.  Usual duct  epithelial hyperplasia.   CORONARY STENT INTERVENTION N/A  05/08/2019   Procedure: CORONARY STENT INTERVENTION;  Surgeon: Swaziland, Peter M, MD;  Location: [facility] INVASIVE CV LAB;  Service: Cardiovascular;  Laterality: N/A;   FRACTURE SURGERY  2005   right ankle   HEMORRHOID SURGERY     HEMORRHOID SURGERY  2015   INCISION AND DRAINAGE BREAST ABSCESS  09/2010   left breast    LEFT HEART CATH AND CORONARY ANGIOGRAPHY N/A 05/08/2019   Procedure: LEFT HEART CATH AND CORONARY ANGIOGRAPHY;  Surgeon: Swaziland, Peter M, MD;  Location: [facility] INVASIVE CV LAB;  Service: Cardiovascular;  Laterality: N/A;    Social History   Socioeconomic History   Marital status: Single    Spouse name: Not on file   Number of children: 3   Years of education: 14   Highest education level: Associate degree: academic program  Occupational History   Occupation: Waitress  Tobacco Use   Smoking status: Former    Current packs/day: 0.00    Average packs/day: 0.1 packs/day for 26.0 years (2.6 ttl pk-yrs)    Types: Cigarettes    Start date: 09/15/1993    Quit date: 09/16/2019    Years since quitting: 4.5   Smokeless tobacco: Never  Vaping Use   Vaping status: Every Day   Substances: Nicotine, Flavoring  Substance and Sexual Activity   Alcohol use: No    Comment: quit 2018   Drug use: No   Sexual activity: Yes  Birth control/protection: None  Other Topics Concern   Not on file  Social History Narrative   Lives at home with her mother, husband and three daughters.   Right-handed.   Approximately 20 cups caffeine  per day (4-5 glasses holding 32oz of tea).   Social Drivers of Health   Financial Resource Strain: Medium Risk (07/06/2023)   Overall Financial Resource Strain (CARDIA)    Difficulty of Paying Living Expenses: Somewhat hard  Food Insecurity: No Food Insecurity (01/30/2024)   Hunger Vital Sign    Worried About Running Out of Food in the Last Year: Never true    Ran Out of Food in the Last Year: Never true  Transportation Needs: No Transportation Needs (01/30/2024)    PRAPARE - Administrator, Civil Service (Medical): No    Lack of Transportation (Non-Medical): No  Physical Activity: Sufficiently Active (07/06/2023)   Exercise Vital Sign    Days of Exercise per Week: 5 days    Minutes of Exercise per Session: 90 min  Stress: No Stress Concern Present (07/06/2023)   [employer] of Occupational Health - Occupational Stress Questionnaire    Feeling of Stress : Only a little  Social Connections: Moderately Isolated (07/06/2023)   Social Connection and Isolation Panel [NHANES]    Frequency of Communication with Friends and Family: More than three times a week    Frequency of Social Gatherings with Friends and Family: More than three times a week    Attends Religious Services: Never    Database administrator or Organizations: No    Attends Banker Meetings: Never    Marital Status: Living with partner  Intimate Partner Violence: Not At Risk (01/30/2024)   Humiliation, Afraid, Rape, and Kick questionnaire    Fear of Current or Ex-Partner: No    Emotionally Abused: No    Physically Abused: No    Sexually Abused: No    Outpatient Encounter Medications as of 04/10/2024  Medication Sig   acetaminophen  (TYLENOL ) 325 MG tablet Take 2 tablets (650 mg total) by mouth every 6 (six) hours as needed (for pain scale < 4).   aspirin  EC 81 MG tablet Take 2 tablets (162 mg total) by mouth daily. Swallow whole.   aspirin -acetaminophen -caffeine  (EXCEDRIN MIGRAINE) 250-250-65 MG tablet Take 2 tablets by mouth every 6 (six) hours as needed for headache.   fluticasone  (FLONASE ) 50 MCG/ACT nasal spray Place 2 sprays into both nostrils daily.   atorvastatin  (LIPITOR ) 80 MG tablet Take 1 tablet (80 mg total) by mouth daily.   enalapril  (VASOTEC ) 10 MG tablet Take 0.5 tablets (5 mg total) by mouth daily.   labetalol  (NORMODYNE ) 300 MG tablet Take 1 tablet (300 mg total) by mouth 3 (three) times daily.   NIFEdipine  (ADALAT  CC) 60 MG 24 hr tablet Take  1 tablet (60 mg total) by mouth 2 (two) times daily.   [DISCONTINUED] furosemide  (LASIX ) 40 MG tablet Take 1 tablet (40 mg total) by mouth daily for 5 days.   [DISCONTINUED] loratadine  (CLARITIN ) 10 MG tablet Take 1 tablet (10 mg total) by mouth daily.   [DISCONTINUED] potassium chloride  SA (KLOR-CON  M20) 20 MEQ tablet Take 1 tablet (20 mEq total) by mouth daily for 5 days.   No facility-administered encounter medications on file as of 04/10/2024.    Allergies  Allergen Reactions   Diclofenac  Hives   Poison Ivy Extract Other (See Comments)    poison ivy extract    Pertinent ROS per HPI, otherwise unremarkable  Objective:  BP (!) 158/88 (BP Location: Left Arm, Cuff Size: Normal)   Pulse 76   Temp (!) 97.3 F (36.3 C)   Ht 5\' 9"  (1.753 m)   Wt 237 lb 6.4 oz (107.7 kg)   LMP 03/20/2024 (Exact Date) Comment: no period yet  SpO2 95%   BMI 35.06 kg/m    Wt Readings from Last 3 Encounters:  04/10/24 237 lb 6.4 oz (107.7 kg)  03/13/24 233 lb 4.8 oz (105.8 kg)  02/09/24 225 lb 11.2 oz (102.4 kg)    Physical Exam Vitals and nursing note reviewed.  Constitutional:      General: She is not in acute distress.    Appearance: Normal appearance. She is morbidly obese. She is not ill-appearing, toxic-appearing or diaphoretic.  HENT:     Head: Normocephalic and atraumatic.     Nose: Nose normal.     Mouth/Throat:     Mouth: Mucous membranes are moist.  Eyes:     Pupils: Pupils are equal, round, and reactive to light.  Cardiovascular:     Rate and Rhythm: Normal rate and regular rhythm.     Heart sounds: Normal heart sounds.  Pulmonary:     Effort: Pulmonary effort is normal.     Breath sounds: Normal breath sounds.  Musculoskeletal:     Right lower leg: No edema.     Left lower leg: No edema.  Skin:    General: Skin is warm and dry.     Capillary Refill: Capillary refill takes less than 2 seconds.  Neurological:     General: No focal deficit present.     Mental Status:  She is alert and oriented to person, place, and time.  Psychiatric:        Mood and Affect: Mood normal.        Behavior: Behavior normal. Behavior is cooperative.        Thought Content: Thought content normal.        Judgment: Judgment normal.    Results for orders placed or performed in visit on 03/22/24  Glucose Tolerance, 2 Hours w/1 Hour   Collection Time: 03/22/24 10:08 AM  Result Value Ref Range   Glucose, Fasting 95 (H) 70 - 91 mg/dL   Glucose, 1 hour 161 70 - 179 mg/dL   Glucose, 2 hour 69 (L) 70 - 152 mg/dL       Pertinent labs & imaging results that were available during my care of the patient were reviewed by me and considered in my medical decision making.  Assessment & Plan:  Alfie was seen today for weight loss.  Diagnoses and all orders for this visit:  Morbid obesity (HCC) -     Bayer DCA Hb A1c Waived -     CBC with Differential/Platelet -     CMP14+EGFR -     Lipid panel -     Thyroid  Panel With TSH -     VITAMIN D  25 Hydroxy (Vit-D Deficiency, Fractures)  Essential hypertension -     CBC with Differential/Platelet -     CMP14+EGFR -     Thyroid  Panel With TSH  Mixed hyperlipidemia -     CMP14+EGFR -     Lipid panel  Vitamin D  deficiency -     CMP14+EGFR -     VITAMIN D  25 Hydroxy (Vit-D Deficiency, Fractures)  History of gestational diabetes -     Bayer DCA Hb A1c Waived -     CMP14+EGFR  Obesity BMI is 35, indicating morbid obesity, with comorbid hypertension and hyperlipidemia. Weight gain post-pregnancy, current weight 237 lbs. At increased risk for cardiovascular and renal disease due to obesity and uncontrolled hypertension. Attempting heart-healthy diet and reduced carbohydrate intake. - Order baseline labs: A1c, kidney function, thyroid  function - Instruct to maintain diet and exercise log - Advise use of My Fitness Pal app for tracking caloric intake, water intake, and activity - Provide meal planning handout - Schedule  follow-up in 6 weeks to assess weight management progress and discuss potential medication options - Discuss potential use of GLP-1 medications if weight management goals are unmet and insurance approval is obtained  Hypertension Currently on labetalol , nifedipine , and enalapril . Blood pressure is poorly controlled, possibly due to non-adherence. Missed medication doses last night and today. Uncontrolled hypertension increases cardiovascular and renal risk. - Emphasize medication adherence for blood pressure control - Reassess blood pressure control at follow-up  Elevated A1c Recent glucose levels slightly elevated. A1c testing needed to evaluate glycemic control over the past three months. Monitoring essential to prevent progression to type 2 diabetes. - Order A1c test  General Health Maintenance Current work activity insufficient for weight loss. Needs additional 150 minutes of exercise per week to achieve caloric deficit. - Advise 150 minutes of exercise per week outside of work activities           Continue all other maintenance medications.  Follow up plan: Return in about 6 weeks (around 05/22/2024), or if symptoms worsen or fail to improve, for BMI.   Continue healthy lifestyle choices, including diet (rich in fruits, vegetables, and lean proteins, and low in salt and simple carbohydrates) and exercise (at least 30 minutes of moderate physical activity daily).  Educational handout given for healthy meal planning  The above assessment and management plan was discussed with the patient. The patient verbalized understanding of and has agreed to the management plan. Patient is aware to call the clinic if they develop any new symptoms or if symptoms persist or worsen. Patient is aware when to return to the clinic for a follow-up visit. Patient educated on when it is appropriate to go to the emergency department.   Kattie Parrot, FNP-C [facility] [phone]

## 2024-04-11 LAB — CBC WITH DIFFERENTIAL/PLATELET
Basophils Absolute: 0.1 10*3/uL (ref 0.0–0.2)
Basos: 1 %
EOS (ABSOLUTE): 0.2 10*3/uL (ref 0.0–0.4)
Eos: 2 %
Hematocrit: 43.5 % (ref 34.0–46.6)
Hemoglobin: 14.2 g/dL (ref 11.1–15.9)
Immature Grans (Abs): 0.1 10*3/uL (ref 0.0–0.1)
Immature Granulocytes: 1 %
Lymphocytes Absolute: 2.3 10*3/uL (ref 0.7–3.1)
Lymphs: 29 %
MCH: 26.4 pg — ABNORMAL LOW (ref 26.6–33.0)
MCHC: 32.6 g/dL (ref 31.5–35.7)
MCV: 81 fL (ref 79–97)
Monocytes Absolute: 0.8 10*3/uL (ref 0.1–0.9)
Monocytes: 9 %
Neutrophils Absolute: 4.6 10*3/uL (ref 1.4–7.0)
Neutrophils: 58 %
Platelets: 280 10*3/uL (ref 150–450)
RBC: 5.37 x10E6/uL — ABNORMAL HIGH (ref 3.77–5.28)
RDW: 14.2 % (ref 11.7–15.4)
WBC: 8 10*3/uL (ref 3.4–10.8)

## 2024-04-11 LAB — CMP14+EGFR
ALT: 22 IU/L (ref 0–32)
AST: 23 IU/L (ref 0–40)
Albumin: 4.2 g/dL (ref 3.9–4.9)
Alkaline Phosphatase: 79 IU/L (ref 44–121)
BUN/Creatinine Ratio: 20 (ref 9–23)
BUN: 12 mg/dL (ref 6–24)
Bilirubin Total: 0.4 mg/dL (ref 0.0–1.2)
CO2: 17 mmol/L — ABNORMAL LOW (ref 20–29)
Calcium: 9.7 mg/dL (ref 8.7–10.2)
Chloride: 106 mmol/L (ref 96–106)
Creatinine, Ser: 0.61 mg/dL (ref 0.57–1.00)
Globulin, Total: 2.6 g/dL (ref 1.5–4.5)
Glucose: 106 mg/dL — ABNORMAL HIGH (ref 70–99)
Potassium: 4 mmol/L (ref 3.5–5.2)
Sodium: 139 mmol/L (ref 134–144)
Total Protein: 6.8 g/dL (ref 6.0–8.5)
eGFR: 115 mL/min/{1.73_m2} (ref >59)

## 2024-04-11 LAB — LIPID PANEL
Cholesterol, Total: 199 mg/dL (ref 100–199)
HDL: 39 mg/dL — ABNORMAL LOW (ref >39)
LDL CALC COMMENT:: 5.1 ratio — ABNORMAL HIGH (ref 0.0–4.4)
LDL Chol Calc (NIH): 115 mg/dL — ABNORMAL HIGH (ref 0–99)
Triglycerides: 255 mg/dL — ABNORMAL HIGH (ref 0–149)
VLDL Cholesterol Cal: 45 mg/dL — ABNORMAL HIGH (ref 5–40)

## 2024-04-11 LAB — THYROID PANEL WITH TSH
Free Thyroxine Index: 1.7 (ref 1.2–4.9)
T3 Uptake Ratio: 24 % (ref 24–39)
T4, Total: 6.9 ug/dL (ref 4.5–12.0)
TSH: 1.88 u[IU]/mL (ref 0.450–4.500)

## 2024-04-11 LAB — VITAMIN D 25 HYDROXY (VIT D DEFICIENCY, FRACTURES): Vit D, 25-Hydroxy: 19 ng/mL — ABNORMAL LOW (ref 30.0–100.0)

## 2024-04-11 NOTE — Progress Notes (Signed)
Read note to pt °

## 2024-04-15 ENCOUNTER — Other Ambulatory Visit (HOSPITAL_COMMUNITY)

## 2024-04-19 ENCOUNTER — Other Ambulatory Visit (HOSPITAL_COMMUNITY)

## 2024-04-25 ENCOUNTER — Other Ambulatory Visit (HOSPITAL_COMMUNITY)
Admission: RE | Admit: 2024-04-25 | Discharge: 2024-04-25 | Disposition: A | Discharge status: Home / Self Care | Payer: Self-pay | Source: Ambulatory Visit | Attending: Oncology | Admitting: Oncology

## 2024-04-25 ENCOUNTER — Other Ambulatory Visit (HOSPITAL_COMMUNITY)

## 2024-04-26 ENCOUNTER — Other Ambulatory Visit (HOSPITAL_COMMUNITY)

## 2024-04-29 ENCOUNTER — Other Ambulatory Visit (HOSPITAL_COMMUNITY): Payer: Self-pay

## 2024-04-29 ENCOUNTER — Other Ambulatory Visit: Payer: Self-pay

## 2024-05-06 ENCOUNTER — Encounter: Payer: Self-pay | Admitting: Family Medicine

## 2024-05-08 LAB — GENECONNECT MOLECULAR SCREEN: Genetic Analysis Overall Interpretation: NEGATIVE

## 2024-05-13 ENCOUNTER — Other Ambulatory Visit: Payer: Self-pay | Admitting: *Deleted

## 2024-05-13 MED ORDER — ENALAPRIL MALEATE 10 MG PO TABS: 5.0000 mg | ORAL_TABLET | Freq: Every day | ORAL | 0 refills | Status: DC

## 2024-05-13 MED ORDER — LABETALOL HCL 300 MG PO TABS: 300.0000 mg | ORAL_TABLET | Freq: Three times a day (TID) | ORAL | 0 refills | Status: DC

## 2024-05-13 NOTE — Telephone Encounter (Signed)
 30 day refills sent as advised by provider.

## 2024-05-17 ENCOUNTER — Other Ambulatory Visit: Payer: Self-pay | Admitting: Family Medicine

## 2024-05-17 DIAGNOSIS — B9689 Other specified bacterial agents as the cause of diseases classified elsewhere: Secondary | ICD-10-CM

## 2024-05-22 ENCOUNTER — Ambulatory Visit: Admitting: Family Medicine

## 2024-05-22 ENCOUNTER — Telehealth: Payer: Self-pay

## 2024-05-22 DIAGNOSIS — E559 Vitamin D deficiency, unspecified: Secondary | ICD-10-CM

## 2024-05-22 DIAGNOSIS — I252 Old myocardial infarction: Secondary | ICD-10-CM

## 2024-05-22 DIAGNOSIS — I10 Essential (primary) hypertension: Secondary | ICD-10-CM

## 2024-05-22 DIAGNOSIS — E782 Mixed hyperlipidemia: Secondary | ICD-10-CM

## 2024-05-22 MED ORDER — SEMAGLUTIDE-WEIGHT MANAGEMENT 2.4 MG/0.75ML ~~LOC~~ SOAJ: 2.4000 mg | SUBCUTANEOUS | 0 refills | Status: DC

## 2024-05-22 MED ORDER — SEMAGLUTIDE-WEIGHT MANAGEMENT 1 MG/0.5ML ~~LOC~~ SOAJ: 1.0000 mg | SUBCUTANEOUS | 0 refills | Status: AC

## 2024-05-22 MED ORDER — SEMAGLUTIDE-WEIGHT MANAGEMENT 0.5 MG/0.5ML ~~LOC~~ SOAJ: 0.5000 mg | SUBCUTANEOUS | 0 refills | Status: AC

## 2024-05-22 MED ORDER — SEMAGLUTIDE-WEIGHT MANAGEMENT 0.25 MG/0.5ML ~~LOC~~ SOAJ: 0.2500 mg | SUBCUTANEOUS | 0 refills | Status: AC

## 2024-05-22 MED ORDER — SEMAGLUTIDE-WEIGHT MANAGEMENT 1.7 MG/0.75ML ~~LOC~~ SOAJ
1.7000 mg | SUBCUTANEOUS | 0 refills | Status: AC
Start: 2024-08-17 — End: 2024-09-14

## 2024-05-22 NOTE — Telephone Encounter (Signed)
 Pharmacy Patient Advocate Encounter   Received notification from CoverMyMeds that prior authorization for Wegovy  0.25MG /0.5ML auto-injectors  is required/requested.   Insurance verification completed.   The patient is insured through [employer] .   Per test claim: PA required; PA started via CoverMyMeds. KEY [id number] . Waiting for clinical questions to populate.

## 2024-05-22 NOTE — Patient Instructions (Signed)

## 2024-05-22 NOTE — Progress Notes (Signed)
 Subjective:  Patient ID: Kaitlin Torres, female    DOB: 1981-05-15, 43 y.o.   MRN: [id number]  Patient Care Team: Severa Rock HERO, FNP as PCP - General (Family Medicine) Tobb, Kardie, DO as PCP - Cardiology (Cardiology)   Chief Complaint:  BMI (6 week follow up )   HPI: Kaitlin Torres is a 43 y.o. female presenting on 05/22/2024 for BMI (6 week follow up )   Kaitlin Torres is a 43 year old female who presents for follow-up on weight management and medication discussion.She has been actively working on [employer] management by reducing carbohydrate intake and eliminating sugar from her diet. However, she is uncertain about which foods are considered starchy. For exercise, she has been picking up extra shifts at work, which involves more walking, and she also picks up her mother, which she considers part of her cardio routine. She has not gained any weight since the last visit.There is no family history of pancreatitis, pancreatic cancer, medullary thyroid  cancer, or MEN2 syndrome, which are relevant to the medication discussion.Her cholesterol was noted to be slightly elevated at the last visit. She has not started taking vitamin D  yet, which was recommended to help with fatigue.          Relevant past medical, surgical, family, and social history reviewed and updated as indicated.  Allergies and medications reviewed and updated. Data reviewed: Chart in Epic.   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  Fibrocystic change.  Usual duct  epithelial hyperplasia.    CORONARY STENT INTERVENTION N/A 05/08/2019   Procedure: CORONARY STENT INTERVENTION;  Surgeon: Swaziland, Peter M, MD;  Location: [facility] INVASIVE CV LAB;  Service: Cardiovascular;  Laterality: N/A;   FRACTURE SURGERY  2005   right ankle   HEMORRHOID SURGERY     HEMORRHOID SURGERY  2015   INCISION AND DRAINAGE BREAST ABSCESS  09/2010   left breast    LEFT HEART CATH AND CORONARY ANGIOGRAPHY N/A 05/08/2019   Procedure: LEFT HEART CATH AND CORONARY ANGIOGRAPHY;  Surgeon: Swaziland, Peter M, MD;  Location: [facility] INVASIVE CV LAB;  Service: Cardiovascular;  Laterality: N/A;    Social History   Socioeconomic History   Marital status: Single    Spouse name: Not on file   Number of children: 3   Years of education: 14   Highest education level: Associate degree: academic program  Occupational History   Occupation: Waitress  Tobacco Use   Smoking status: Former    Current packs/day: 0.00    Average packs/day: 0.1 packs/day for 26.0 years (2.6 ttl pk-yrs)    Types: Cigarettes    Start date: 09/15/1993    Quit date: 09/16/2019    Years since quitting: 4.6   Smokeless tobacco: Never  Vaping Use   Vaping status: Every Day   Substances: Nicotine, Flavoring  Substance and Sexual Activity   Alcohol use: No    Comment: quit 2018   Drug use: No  Sexual activity: Yes    Birth control/protection: None  Other Topics Concern   Not on file  Social History Narrative   Lives at home with her mother, husband and three daughters.   Right-handed.   Approximately 20 cups caffeine  per day (4-5 glasses holding 32oz of tea).   Social Drivers of Corporate investment banker Strain: Low Risk  (05/22/2024)   Overall Financial Resource Strain (CARDIA)    Difficulty of Paying Living Expenses: Not very hard  Food Insecurity: No Food Insecurity (05/22/2024)   Hunger Vital Sign    Worried About Running Out of Food in the Last Year: Never true    Ran Out of Food in the Last Year: Never true  Transportation Needs: No  Transportation Needs (05/22/2024)   PRAPARE - Administrator, Civil Service (Medical): No    Lack of Transportation (Non-Medical): No  Physical Activity: Sufficiently Active (05/22/2024)   Exercise Vital Sign    Days of Exercise per Week: 4 days    Minutes of Exercise per Session: 60 min  Stress: No Stress Concern Present (05/22/2024)   [employer] of Occupational Health - Occupational Stress Questionnaire    Feeling of Stress: Not at all  Social Connections: Moderately Isolated (05/22/2024)   Social Connection and Isolation Panel    Frequency of Communication with Friends and Family: More than three times a week    Frequency of Social Gatherings with Friends and Family: More than three times a week    Attends Religious Services: Never    Database administrator or Organizations: No    Attends Engineer, structural: Not on file    Marital Status: Living with partner  Intimate Partner Violence: Not At Risk (01/30/2024)   Humiliation, Afraid, Rape, and Kick questionnaire    Fear of Current or Ex-Partner: No    Emotionally Abused: No    Physically Abused: No    Sexually Abused: No    Outpatient Encounter Medications as of 05/22/2024  Medication Sig   aspirin  EC 81 MG tablet Take 2 tablets (162 mg total) by mouth daily. Swallow whole.   aspirin -acetaminophen -caffeine  (EXCEDRIN MIGRAINE) 250-250-65 MG tablet Take 2 tablets by mouth every 6 (six) hours as needed for headache.   atorvastatin  (LIPITOR ) 80 MG tablet Take 1 tablet (80 mg total) by mouth daily.   enalapril  (VASOTEC ) 10 MG tablet Take 0.5 tablets (5 mg total) by mouth daily.   fluticasone  (FLONASE ) 50 MCG/ACT nasal spray Place 2 sprays into both nostrils daily.   labetalol  (NORMODYNE ) 300 MG tablet Take 1 tablet (300 mg total) by mouth 3 (three) times daily.   NIFEdipine  (ADALAT  CC) 60 MG 24 hr tablet Take 1 tablet (60 mg total) by mouth 2 (two) times daily.   Semaglutide -Weight Management 0.25 MG/0.5ML SOAJ  Inject 0.25 mg into the skin once a week for 28 days.   [START ON 06/20/2024] Semaglutide -Weight Management 0.5 MG/0.5ML SOAJ Inject 0.5 mg into the skin once a week for 28 days.   [START ON 07/19/2024] Semaglutide -Weight Management 1 MG/0.5ML SOAJ Inject 1 mg into the skin once a week for 28 days.   [START ON 08/17/2024] Semaglutide -Weight Management 1.7 MG/0.75ML SOAJ Inject 1.7 mg into the skin once a week for 28 days.   [START ON 09/15/2024] Semaglutide -Weight Management 2.4 MG/0.75ML SOAJ Inject 2.4 mg into the skin once a week for 28 days.   [DISCONTINUED] acetaminophen  (TYLENOL ) 325 MG tablet Take 2 tablets (650 mg total) by mouth every  6 (six) hours as needed (for pain scale < 4).   No facility-administered encounter medications on file as of 05/22/2024.    Allergies  Allergen Reactions   Diclofenac  Hives   Poison Ivy Extract Other (See Comments)    poison ivy extract    Pertinent ROS per HPI, otherwise unremarkable      Objective:  BP 121/82   Pulse 62   Temp (!) 97.2 F (36.2 C)   Ht 5' 9 (1.753 m)   Wt 237 lb (107.5 kg)   SpO2 98%   BMI 35.00 kg/m    Wt Readings from Last 3 Encounters:  05/22/24 237 lb (107.5 kg)  04/10/24 237 lb 6.4 oz (107.7 kg)  03/13/24 233 lb 4.8 oz (105.8 kg)    Physical Exam Vitals and nursing note reviewed.  Constitutional:      General: She is not in acute distress.    Appearance: Normal appearance. She is well-developed and well-groomed. She is morbidly obese. She is not ill-appearing, toxic-appearing or diaphoretic.  HENT:     Head: Normocephalic and atraumatic.     Mouth/Throat:     Mouth: Mucous membranes are moist.  Eyes:     Pupils: Pupils are equal, round, and reactive to light.  Neck:     Thyroid : No thyroid  mass, thyromegaly or thyroid  tenderness.     Trachea: Trachea and phonation normal.  Cardiovascular:     Rate and Rhythm: Normal rate and regular rhythm.     Heart sounds: Normal heart sounds.  Pulmonary:      Effort: Pulmonary effort is normal.     Breath sounds: Normal breath sounds.  Musculoskeletal:     Cervical back: Neck supple.     Right lower leg: No edema.     Left lower leg: No edema.  Skin:    General: Skin is warm and dry.     Capillary Refill: Capillary refill takes less than 2 seconds.  Neurological:     General: No focal deficit present.     Mental Status: She is alert and oriented to person, place, and time.  Psychiatric:        Mood and Affect: Mood normal.        Behavior: Behavior normal. Behavior is cooperative.        Thought Content: Thought content normal.        Judgment: Judgment normal.      Results for orders placed or performed during the hospital encounter of 04/25/24  GeneConnect Molecular Screen - Blood ([facility])   Collection Time: 04/25/24  3:19 PM  Result Value Ref Range   Genetic Analysis Overall Interpretation Negative    Genetic Disease Assessed      This is a screening test and does not detect all pathogenic or likely pathogenic variant(s) in the tested genes; diagnostic testing is recommended for individuals with a personal or family history of heart disease or hereditary cancer. [employer] [facility]  Population Screen is a screening test that analyzes 11 genes related to hereditary breast and ovarian cancer (HBOC) syndrome, Lynch syndrome, and familial hypercholesterolemia. This test only reports clinically significant pathogenic and likely  pathogenic variants but does not report variants of uncertain significance (VUS). In addition, analysis of the PMS2 gene excludes exons 11-15, which overlap with a known pseudogene (PMS2CL).    Genetic Analysis Report      No pathogenic or likely pathogenic variants were detected in the genes analyzed by this test.Genetic test results should  be interpreted in the context of an individual's personal medical and family history. Alteration to medical management is NOT  recommended based solely on this  result. Clinical correlation is advised.Additional Considerations- This is a screening test; individuals may still carry pathogenic or likely pathogenic variant(s) in the tested genes that are not detected by this test.-  For individuals at risk for these or other related conditions based on factors including personal or family history, diagnostic testing is recommended.- The absence of pathogenic or likely pathogenic variant(s) in the analyzed genes, while reassuring,  does not eliminate the possibility of a hereditary condition; there are other variants and genes associated with heart disease and hereditary cancer that are not included in this test.    Genes Tested See Notes    Disclaimer See Notes    Sequencing Location See Notes    Interpretation Methods and Limitations See Notes        Pertinent labs & imaging results that were available during my care of the patient were reviewed by me and considered in my medical decision making.  Assessment & Plan:  Oney was seen today for bmi.  Diagnoses and all orders for this visit:  Morbid obesity (HCC) -     Semaglutide -Weight Management 0.25 MG/0.5ML SOAJ; Inject 0.25 mg into the skin once a week for 28 days. -     Semaglutide -Weight Management 0.5 MG/0.5ML SOAJ; Inject 0.5 mg into the skin once a week for 28 days. -     Semaglutide -Weight Management 1 MG/0.5ML SOAJ; Inject 1 mg into the skin once a week for 28 days. -     Semaglutide -Weight Management 1.7 MG/0.75ML SOAJ; Inject 1.7 mg into the skin once a week for 28 days. -     Semaglutide -Weight Management 2.4 MG/0.75ML SOAJ; Inject 2.4 mg into the skin once a week for 28 days.  History of non-ST elevation myocardial infarction (NSTEMI) -     Semaglutide -Weight Management 0.25 MG/0.5ML SOAJ; Inject 0.25 mg into the skin once a week for 28 days. -     Semaglutide -Weight Management 0.5 MG/0.5ML SOAJ; Inject 0.5 mg into the skin once a week for 28 days. -     Semaglutide -Weight Management 1  MG/0.5ML SOAJ; Inject 1 mg into the skin once a week for 28 days. -     Semaglutide -Weight Management 1.7 MG/0.75ML SOAJ; Inject 1.7 mg into the skin once a week for 28 days. -     Semaglutide -Weight Management 2.4 MG/0.75ML SOAJ; Inject 2.4 mg into the skin once a week for 28 days.  Mixed hyperlipidemia -     Semaglutide -Weight Management 0.25 MG/0.5ML SOAJ; Inject 0.25 mg into the skin once a week for 28 days. -     Semaglutide -Weight Management 0.5 MG/0.5ML SOAJ; Inject 0.5 mg into the skin once a week for 28 days. -     Semaglutide -Weight Management 1 MG/0.5ML SOAJ; Inject 1 mg into the skin once a week for 28 days. -     Semaglutide -Weight Management 1.7 MG/0.75ML SOAJ; Inject 1.7 mg into the skin once a week for 28 days. -     Semaglutide -Weight Management 2.4 MG/0.75ML SOAJ; Inject 2.4 mg into the skin once a week for 28 days.  Essential hypertension -     Semaglutide -Weight Management 0.25 MG/0.5ML SOAJ; Inject 0.25 mg into the skin once a week for 28 days. -     Semaglutide -Weight Management 0.5 MG/0.5ML SOAJ; Inject 0.5 mg into the skin once a week for 28 days. -  Semaglutide -Weight Management 1 MG/0.5ML SOAJ; Inject 1 mg into the skin once a week for 28 days. -     Semaglutide -Weight Management 1.7 MG/0.75ML SOAJ; Inject 1.7 mg into the skin once a week for 28 days. -     Semaglutide -Weight Management 2.4 MG/0.75ML SOAJ; Inject 2.4 mg into the skin once a week for 28 days.  Vitamin D  deficiency Aware to start repletion therapy as discussed.     Obesity Weight maintenance since the last visit suggests adherence to diet and exercise. Discussed potential use of GLP-1 receptor agonist, Wegovy , for weight management. Medicaid covers Wegovy  with prior authorization. No contraindications such as pancreatitis, pancreatic cancer, medullary thyroid  cancer, or MEN2 syndrome identified.- Submit prior authorization for Wegovy .- Initiate Wegovy  at 0.25 mg weekly, increasing to 0.5 mg weekly  after four weeks, with a maximum dose of 2.4 mg monthly.- Educate on subcutaneous injection administration at home.- Advise to avoid heavy foods and carbonated beverages, increase water intake, and consume small frequent meals to mitigate side effects like nausea, vomiting, constipation, or diarrhea.- Schedule follow-up in 6 weeks to reassess BMI and labs including kidney, liver, and thyroid  function.Surgical considerations with GLP-1 receptor agonistsPlanned surgery in August. If general anesthesia is used, stop medication 7 days prior to prevent potential ileus due to slowed GI tract.- Instruct to confirm type of anesthesia for upcoming surgery.- Advise to discontinue GLP-1 receptor agonist 7 days before surgery if general anesthesia is planned.HyperlipidemiaCholesterol levels were slightly elevated at the last visit.- Recheck cholesterol levels at the next follow-up.  Vitamin D  deficiency Has not started vitamin D  supplementation, important for addressing potential fatigue, which may be exacerbated by new medication.- Instruct to take 2000 IU of vitamin D3 daily, available over the counter.          Continue all other maintenance medications.  Follow up plan: Return in about 6 weeks (around 07/03/2024), or if symptoms worsen or fail to improve, for BMI.   Continue healthy lifestyle choices, including diet (rich in fruits, vegetables, and lean proteins, and low in salt and simple carbohydrates) and exercise (at least 30 minutes of moderate physical activity daily).  Educational handout given for GLP1 success tips  The above assessment and management plan was discussed with the patient. The patient verbalized understanding of and has agreed to the management plan. Patient is aware to call the clinic if they develop any new symptoms or if symptoms persist or worsen. Patient is aware when to return to the clinic for a follow-up visit. Patient educated on when it is appropriate to go to the emergency  department.   Rosaline Bruns, FNP-C [facility] [phone]

## 2024-05-23 ENCOUNTER — Other Ambulatory Visit (HOSPITAL_COMMUNITY): Payer: Self-pay

## 2024-05-23 NOTE — Telephone Encounter (Signed)
 Pharmacy Patient Advocate Encounter  Received notification from [facility] that Prior Authorization for Wegovy  0.25mg /0.39ml has been APPROVED from 05/23/24 to 11/19/24. Ran test claim, Copay is $4. This test claim was processed through [facility]- copay amounts may vary at other pharmacies due to pharmacy/plan contracts, or as the patient moves through the different stages of their insurance plan.   Approval letter indexed to media tab. Left a message at the pharmacy to notify of the approval.

## 2024-05-23 NOTE — Telephone Encounter (Signed)
 PLEASE BE ADVISED Clinical questions have been answered and PA submitted.TO PLAN. PA currently Pending.

## 2024-06-25 ENCOUNTER — Encounter: Payer: Self-pay | Admitting: Women's Health

## 2024-06-25 DIAGNOSIS — Z8632 Personal history of gestational diabetes: Secondary | ICD-10-CM

## 2024-07-01 DIAGNOSIS — G5601 Carpal tunnel syndrome, right upper limb: Secondary | ICD-10-CM | POA: Diagnosis not present

## 2024-07-03 ENCOUNTER — Ambulatory Visit: Admitting: Family Medicine

## 2024-07-03 ENCOUNTER — Encounter: Payer: Self-pay | Admitting: Family Medicine

## 2024-07-03 VITALS — BP 120/79 | HR 69 | Temp 97.6°F | Ht 69.0 in | Wt 230.6 lb

## 2024-07-03 DIAGNOSIS — G44209 Tension-type headache, unspecified, not intractable: Secondary | ICD-10-CM

## 2024-07-03 DIAGNOSIS — Z79899 Other long term (current) drug therapy: Secondary | ICD-10-CM | POA: Diagnosis not present

## 2024-07-03 DIAGNOSIS — K148 Other diseases of tongue: Secondary | ICD-10-CM

## 2024-07-03 DIAGNOSIS — Z6832 Body mass index (BMI) 32.0-32.9, adult: Secondary | ICD-10-CM | POA: Diagnosis not present

## 2024-07-03 NOTE — Progress Notes (Signed)
 Subjective:  Patient ID: Kaitlin Torres, female    DOB: April 18, 1981, 43 y.o.   MRN: [id number]  Patient Care Team: Severa Rock HERO, FNP as PCP - General (Family Medicine) Tobb, Kardie, DO as PCP - Cardiology (Cardiology)   Chief Complaint:  BMI (6 week follow up )   HPI: Kaitlin Torres is a 43 y.o. female presenting on 07/03/2024 for BMI (6 week follow up )  Kaitlin Torres is a 43 year old female who presents with daily headaches and a tongue lesion.  She experiences daily headaches that originate in the neck and radiate to the back of the head. She uses Aleve  for relief. No associated nausea or vomiting. No recent changes in sleep patterns, and she sleeps well. No weakness, confusion, syncope, speech changes, or visual changes.   She is currently on Wegovy  for weight management and has lost seven pounds since her last visit. She denies experiencing constipation or nausea as side effects of the medication.  She reports a lesion on her tongue, described as a skin-colored mole that occasionally decreases in size but has been present for a while. She notes waking up with her tongue clenched, which causes discomfort. She reports that the lesion is skin-colored and has not significantly changed in size.     Relevant past medical, surgical, family, and social history reviewed and updated as indicated.  Allergies and medications reviewed and updated. Data reviewed: Chart in Epic.   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  Fibrocystic change.  Usual duct   epithelial hyperplasia.   CORONARY STENT INTERVENTION N/A 05/08/2019   Procedure: CORONARY STENT INTERVENTION;  Surgeon: Swaziland, Peter M, MD;  Location: [facility] INVASIVE CV LAB;  Service: Cardiovascular;  Laterality: N/A;   FRACTURE SURGERY  2005   right ankle   HEMORRHOID SURGERY     HEMORRHOID SURGERY  2015   INCISION AND DRAINAGE BREAST ABSCESS  09/2010   left breast    LEFT HEART CATH AND CORONARY ANGIOGRAPHY N/A 05/08/2019   Procedure: LEFT HEART CATH AND CORONARY ANGIOGRAPHY;  Surgeon: Swaziland, Peter M, MD;  Location: [facility] INVASIVE CV LAB;  Service: Cardiovascular;  Laterality: N/A;    Social History   Socioeconomic History   Marital status: Single    Spouse name: Not on file   Number of children: 3   Years of education: 14   Highest education level: Associate degree: academic program  Occupational History   Occupation: Waitress  Tobacco Use   Smoking status: Former    Current packs/day: 0.00    Average packs/day: 0.1 packs/day for 26.0 years (2.6 ttl pk-yrs)    Types: Cigarettes    Start date: 09/15/1993    Quit date: 09/16/2019    Years since quitting: 4.8   Smokeless tobacco: Never  Vaping Use   Vaping status: Every Day   Substances: Nicotine, Flavoring  Substance and Sexual Activity   Alcohol use: No    Comment:  quit 2018   Drug use: No   Sexual activity: Yes    Birth control/protection: None  Other Topics Concern   Not on file  Social History Narrative   Lives at home with her mother, husband and three daughters.   Right-handed.   Approximately 20 cups caffeine  per day (4-5 glasses holding 32oz of tea).   Social Drivers of Corporate investment banker Strain: Low Risk  (05/22/2024)   Overall Financial Resource Strain (CARDIA)    Difficulty of Paying Living Expenses: Not very hard  Food Insecurity: No Food Insecurity (05/22/2024)   Hunger Vital Sign    Worried About Running Out of Food in the Last Year: Never true    Ran Out of Food in the Last Year: Never true   Transportation Needs: No Transportation Needs (05/22/2024)   PRAPARE - Administrator, Civil Service (Medical): No    Lack of Transportation (Non-Medical): No  Physical Activity: Sufficiently Active (05/22/2024)   Exercise Vital Sign    Days of Exercise per Week: 4 days    Minutes of Exercise per Session: 60 min  Stress: No Stress Concern Present (05/22/2024)   [employer] of Occupational Health - Occupational Stress Questionnaire    Feeling of Stress: Not at all  Social Connections: Moderately Isolated (05/22/2024)   Social Connection and Isolation Panel    Frequency of Communication with Friends and Family: More than three times a week    Frequency of Social Gatherings with Friends and Family: More than three times a week    Attends Religious Services: Never    Database administrator or Organizations: No    Attends Engineer, structural: Not on file    Marital Status: Living with partner  Intimate Partner Violence: Not At Risk (01/30/2024)   Humiliation, Afraid, Rape, and Kick questionnaire    Fear of Current or Ex-Partner: No    Emotionally Abused: No    Physically Abused: No    Sexually Abused: No    Outpatient Encounter Medications as of 07/03/2024  Medication Sig   aspirin  EC 81 MG tablet Take 2 tablets (162 mg total) by mouth daily. Swallow whole.   aspirin -acetaminophen -caffeine  (EXCEDRIN MIGRAINE) 250-250-65 MG tablet Take 2 tablets by mouth every 6 (six) hours as needed for headache.   atorvastatin  (LIPITOR ) 80 MG tablet Take 1 tablet (80 mg total) by mouth daily.   enalapril  (VASOTEC ) 10 MG tablet Take 0.5 tablets (5 mg total) by mouth daily.   fluticasone  (FLONASE ) 50 MCG/ACT nasal spray Place 2 sprays into both nostrils daily.   labetalol  (NORMODYNE ) 300 MG tablet Take 1 tablet (300 mg total) by mouth 3 (three) times daily.   NIFEdipine  (ADALAT  CC) 60 MG 24 hr tablet Take 1 tablet (60 mg total) by mouth 2 (two) times daily.   Semaglutide -Weight  Management 0.5 MG/0.5ML SOAJ Inject 0.5 mg into the skin once a week for 28 days.   [START ON 07/19/2024] Semaglutide -Weight Management 1 MG/0.5ML SOAJ Inject 1 mg into the skin once a week for 28 days.   [START ON 08/17/2024] Semaglutide -Weight Management 1.7 MG/0.75ML SOAJ Inject 1.7 mg into the skin once a week for 28 days. (Patient not taking: Reported on 07/03/2024)   [START ON 09/15/2024] Semaglutide -Weight Management 2.4 MG/0.75ML SOAJ Inject 2.4 mg into the skin once a week for 28 days. (Patient not taking: Reported on 07/03/2024)   No facility-administered encounter medications on file as of 07/03/2024.    Allergies  Allergen Reactions  Diclofenac  Hives   Poison Ivy Extract Other (See Comments)    poison ivy extract    Pertinent ROS per HPI, otherwise unremarkable      Objective:  BP 120/79   Pulse 69   Temp 97.6 F (36.4 C)   Ht 5' 9 (1.753 m)   Wt 230 lb 9.6 oz (104.6 kg)   SpO2 96%   BMI 34.05 kg/m    Wt Readings from Last 3 Encounters:  07/03/24 230 lb 9.6 oz (104.6 kg)  05/22/24 237 lb (107.5 kg)  04/10/24 237 lb 6.4 oz (107.7 kg)    Physical Exam Vitals and nursing note reviewed.  Constitutional:      General: She is not in acute distress.    Appearance: Normal appearance. She is obese. She is not ill-appearing, toxic-appearing or diaphoretic.  HENT:     Head: Normocephalic and atraumatic.     Mouth/Throat:     Lips: Pink.     Mouth: Mucous membranes are moist.     Tongue: Lesions present.     Pharynx: Oropharynx is clear.   Eyes:     Pupils: Pupils are equal, round, and reactive to light.  Cardiovascular:     Rate and Rhythm: Normal rate and regular rhythm.     Heart sounds: Normal heart sounds.  Pulmonary:     Effort: Pulmonary effort is normal.     Breath sounds: Normal breath sounds.  Musculoskeletal:     Cervical back: Neck supple.     Right lower leg: No edema.     Left lower leg: No edema.  Skin:    General: Skin is warm and dry.      Capillary Refill: Capillary refill takes less than 2 seconds.  Neurological:     General: No focal deficit present.     Mental Status: She is alert and oriented to person, place, and time.  Psychiatric:        Mood and Affect: Mood normal.        Behavior: Behavior normal.        Thought Content: Thought content normal.        Judgment: Judgment normal.     Results for orders placed or performed during the hospital encounter of 04/25/24  GeneConnect Molecular Screen - Blood ([facility])   Collection Time: 04/25/24  3:19 PM  Result Value Ref Range   Genetic Analysis Overall Interpretation Negative    Genetic Disease Assessed      This is a screening test and does not detect all pathogenic or likely pathogenic variant(s) in the tested genes; diagnostic testing is recommended for individuals with a personal or family history of heart disease or hereditary cancer. [employer] Tier One  Population Screen is a screening test that analyzes 11 genes related to hereditary breast and ovarian cancer (HBOC) syndrome, Lynch syndrome, and familial hypercholesterolemia. This test only reports clinically significant pathogenic and likely  pathogenic variants but does not report variants of uncertain significance (VUS). In addition, analysis of the PMS2 gene excludes exons 11-15, which overlap with a known pseudogene (PMS2CL).    Genetic Analysis Report      No pathogenic or likely pathogenic variants were detected in the genes analyzed by this test.Genetic test results should be interpreted in the context of an individual's personal medical and family history. Alteration to medical management is NOT  recommended based solely on this result. Clinical correlation is advised.Additional Considerations- This is a screening test; individuals may still carry  pathogenic or likely pathogenic variant(s) in the tested genes that are not detected by this test.-  For individuals at risk for these or other  related conditions based on factors including personal or family history, diagnostic testing is recommended.- The absence of pathogenic or likely pathogenic variant(s) in the analyzed genes, while reassuring,  does not eliminate the possibility of a hereditary condition; there are other variants and genes associated with heart disease and hereditary cancer that are not included in this test.    Genes Tested See Notes    Disclaimer See Notes    Sequencing Location See Notes    Interpretation Methods and Limitations See Notes        Pertinent labs & imaging results that were available during my care of the patient were reviewed by me and considered in my medical decision making.  Assessment & Plan:  Mekaila was seen today for bmi.  Diagnoses and all orders for this visit:  BMI 32.0-32.9,adult -     CBC with Differential/Platelet -     CMP14+EGFR -     Thyroid  Panel With TSH  Tension headache -     CBC with Differential/Platelet -     CMP14+EGFR  High risk medication use -     CBC with Differential/Platelet -     CMP14+EGFR -     Thyroid  Panel With TSH  Tongue lesion -     Ambulatory referral to ENT    Tension-type headache Daily headaches originating from the neck and radiating upwards, consistent with tension-type headaches. No associated nausea or vomiting. Aleve  previously used but advised against due to potential rebound headaches. - Discontinue Aleve  - Start Excedrin Tension for headache management - Apply heat to affected area - Increase water intake - Maintain a headache diary to track frequency and triggers - Order labs to check for dehydration, anemia, and thyroid  function - Follow up sooner if headaches do not improve with current management  Tongue lesion Skin-colored lesion on the tongue that occasionally decreases in size. No signs of inflammation. Reports biting down on it during sleep. - Refer to ENT for evaluation and potential removal of tongue lesion - If  no contact from ENT within 1.5 weeks, she will call the office  Obesity On Wegovy  for weight management. No reported side effects such as constipation or nausea. - Continue Wegovy  with step-up dosing as prescribed - Monitor weight and follow up in 3 months        Continue all other maintenance medications.  Follow up plan: Return in 3 months (on 10/03/2024), or if symptoms worsen or fail to improve, for BMI.   Continue healthy lifestyle choices, including diet (rich in fruits, vegetables, and lean proteins, and low in salt and simple carbohydrates) and exercise (at least 30 minutes of moderate physical activity daily).  Educational handout given for headache diary, tension headache   The above assessment and management plan was discussed with the patient. The patient verbalized understanding of and has agreed to the management plan. Patient is aware to call the clinic if they develop any new symptoms or if symptoms persist or worsen. Patient is aware when to return to the clinic for a follow-up visit. Patient educated on when it is appropriate to go to the emergency department.   Rosaline Bruns, FNP-C [facility] [phone]

## 2024-07-04 ENCOUNTER — Ambulatory Visit: Payer: Self-pay | Admitting: Family Medicine

## 2024-07-04 LAB — CBC WITH DIFFERENTIAL/PLATELET
Basophils Absolute: 0.1 x10E3/uL (ref 0.0–0.2)
Basos: 1 %
EOS (ABSOLUTE): 0.1 x10E3/uL (ref 0.0–0.4)
Eos: 2 %
Hematocrit: 42.8 % (ref 34.0–46.6)
Hemoglobin: 13.8 g/dL (ref 11.1–15.9)
Immature Grans (Abs): 0 x10E3/uL (ref 0.0–0.1)
Immature Granulocytes: 0 %
Lymphocytes Absolute: 2 x10E3/uL (ref 0.7–3.1)
Lymphs: 27 %
MCH: 26.2 pg — ABNORMAL LOW (ref 26.6–33.0)
MCHC: 32.2 g/dL (ref 31.5–35.7)
MCV: 81 fL (ref 79–97)
Monocytes Absolute: 0.5 x10E3/uL (ref 0.1–0.9)
Monocytes: 6 %
Neutrophils Absolute: 4.8 x10E3/uL (ref 1.4–7.0)
Neutrophils: 64 %
Platelets: 321 x10E3/uL (ref 150–450)
RBC: 5.26 x10E6/uL (ref 3.77–5.28)
RDW: 12.7 % (ref 11.7–15.4)
WBC: 7.5 x10E3/uL (ref 3.4–10.8)

## 2024-07-04 LAB — CMP14+EGFR
ALT: 35 IU/L — AB (ref 0–32)
AST: 28 IU/L (ref 0–40)
Albumin: 4.4 g/dL (ref 3.9–4.9)
Alkaline Phosphatase: 76 IU/L (ref 44–121)
BUN/Creatinine Ratio: 22 (ref 9–23)
BUN: 17 mg/dL (ref 6–24)
Bilirubin Total: 0.6 mg/dL (ref 0.0–1.2)
CO2: 20 mmol/L (ref 20–29)
Calcium: 9.8 mg/dL (ref 8.7–10.2)
Chloride: 103 mmol/L (ref 96–106)
Creatinine, Ser: 0.78 mg/dL (ref 0.57–1.00)
Globulin, Total: 2.4 g/dL (ref 1.5–4.5)
Glucose: 88 mg/dL (ref 70–99)
Potassium: 4.2 mmol/L (ref 3.5–5.2)
Sodium: 137 mmol/L (ref 134–144)
Total Protein: 6.8 g/dL (ref 6.0–8.5)
eGFR: 98 mL/min/1.73 (ref >59)

## 2024-07-04 LAB — THYROID PANEL WITH TSH
Free Thyroxine Index: 1.7 (ref 1.2–4.9)
T3 Uptake Ratio: 24 (ref 24–39)
T4, Total: 6.9 ug/dL (ref 4.5–12.0)
TSH: 1.47 u[IU]/mL (ref 0.450–4.500)

## 2024-07-11 DIAGNOSIS — G5601 Carpal tunnel syndrome, right upper limb: Secondary | ICD-10-CM | POA: Diagnosis not present

## 2024-07-15 ENCOUNTER — Encounter: Payer: Self-pay | Admitting: Family Medicine

## 2024-08-05 ENCOUNTER — Ambulatory Visit (INDEPENDENT_AMBULATORY_CARE_PROVIDER_SITE_OTHER)

## 2024-08-05 ENCOUNTER — Encounter (INDEPENDENT_AMBULATORY_CARE_PROVIDER_SITE_OTHER): Payer: Self-pay

## 2024-08-05 ENCOUNTER — Institutional Professional Consult (permissible substitution) (INDEPENDENT_AMBULATORY_CARE_PROVIDER_SITE_OTHER)

## 2024-08-05 VITALS — BP 153/110 | HR 87 | Temp 97.6°F | Ht 69.0 in | Wt 237.8 lb

## 2024-08-05 DIAGNOSIS — D101 Benign neoplasm of tongue: Secondary | ICD-10-CM

## 2024-08-05 NOTE — Addendum Note (Signed)
 Addended by: MILA LAO on: 08/05/2024 01:41 PM   Modules accepted: Orders

## 2024-08-05 NOTE — Progress Notes (Signed)
 HPI:   Kaitlin Torres is a 43 y.o. female who presents as a new patient presenting for consultation regarding a lateral tongue lesion. Patient states that approximately 1-2 months ago she woke up and noted she was biting down on her tongue. After assessing the area she noted a lesion on the left side of her tongue. This remained the same after several days which prompted her to seek evaluation with her PCP who referred her to us . Patient states that this lesion will get bigger and smaller occasionally. States that it is not painful, no stinging, burning, or bleeding. States that it is bothersome due to the location as it is in line with her bite and she frequently does end up biting it while she is eating. She denies any history of similar lesions. She is a former smoker however does not currently smoke. Denies other ENT complaints at this time.   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  Fibrocystic change.  Usual duct  epithelial hyperplasia.   CORONARY STENT INTERVENTION N/A 05/08/2019   Procedure: CORONARY STENT INTERVENTION;  Surgeon: Swaziland, Peter M, MD;  Location: [facility] INVASIVE CV LAB;  Service: Cardiovascular;  Laterality: N/A;   FRACTURE SURGERY  2005   right ankle   HEMORRHOID SURGERY     HEMORRHOID SURGERY  2015   INCISION AND DRAINAGE BREAST ABSCESS  09/2010   left breast    LEFT HEART CATH AND CORONARY ANGIOGRAPHY N/A 05/08/2019   Procedure: LEFT HEART CATH AND CORONARY ANGIOGRAPHY;  Surgeon: Swaziland, Peter M, MD;   Location: [facility] INVASIVE CV LAB;  Service: Cardiovascular;  Laterality: N/A;   No family history of bleeding disorders, wound healing problems or difficulty with anesthesia.  Social Connections: Moderately Isolated (05/22/2024)   Social Connection and Isolation Panel    Frequency of Communication with Friends and Family: More than three times a week    Frequency of Social Gatherings with Friends and Family: More than three times a week    Attends Religious Services: Never    Database administrator or Organizations: No    Attends Engineer, structural: Not on file    Marital Status: Living with partner    Current Outpatient Medications:    aspirin  EC 81 MG tablet, Take 2 tablets (162 mg total) by mouth daily. Swallow whole., Disp: 180 tablet, Rfl: 2   aspirin -acetaminophen -caffeine  (EXCEDRIN MIGRAINE) 250-250-65 MG tablet, Take 2 tablets by mouth every 6 (six) hours as needed for headache., Disp: , Rfl:    atorvastatin  (LIPITOR ) 80 MG tablet, Take 1 tablet (80 mg total) by mouth daily., Disp: 30 tablet, Rfl: 0   enalapril  (VASOTEC ) 10 MG tablet, Take 0.5 tablets (5 mg total) by mouth daily., Disp: 15 tablet, Rfl: 0   fluticasone  (FLONASE ) 50 MCG/ACT nasal spray, Place 2 sprays into both nostrils daily., Disp: 16 g, Rfl: 6   labetalol  (NORMODYNE ) 300 MG tablet, Take 1 tablet (300 mg total) by mouth 3 (three)  times daily., Disp: 90 tablet, Rfl: 0   NIFEdipine  (ADALAT  CC) 60 MG 24 hr tablet, Take 1 tablet (60 mg total) by mouth 2 (two) times daily., Disp: 60 tablet, Rfl: 0   Semaglutide -Weight Management 1 MG/0.5ML SOAJ, Inject 1 mg into the skin once a week for 28 days., Disp: 2 mL, Rfl: 0   [START ON 08/17/2024] Semaglutide -Weight Management 1.7 MG/0.75ML SOAJ, Inject 1.7 mg into the skin once a week for 28 days. (Patient not taking: Reported on 07/03/2024), Disp: 2 mL, Rfl: 0   [START ON 09/15/2024] Semaglutide -Weight Management 2.4 MG/0.75ML SOAJ, Inject 2.4 mg into the skin once a week for  28 days. (Patient not taking: Reported on 07/03/2024), Disp: 2 mL, Rfl: 0 A complete ROS was performed with pertinent positives/negatives noted in the HPI. The remainder of the ROS are negative.   Physical Exam:  BP (!) 153/110 (BP Location: Left Arm) Comment: first attempt 153/110 second attempt 137/97 Pt has not taken BP meds yesterday or today  Pulse 87   Temp 97.6 F (36.4 C)   Ht 5' 9 (1.753 m)   Wt 237 lb 12.8 oz (107.9 kg)   SpO2 95%   BMI 35.12 kg/m  General: Well developed, well nourished. No acute distress. Voice normal Head/Face: Normocephalic. No sinus tenderness. Facial nerve intact and equal bilaterally. No facial lacerations. Eyes: PERRL, no scleral icterus or conjunctival hemorrhage. EOMI. Ears: No gross deformity. Normal external canal. Tympanic membrane in tact bilaterally Hearing: Normal speech reception.  Nose: No gross deformity or lesions. No purulent discharge. No turbinate hypertrophy. Mouth/Oropharynx: Lips without any lesions. Dentition fair. No mucosal lesions within the oropharynx. 2+ tonsils bilaterally, no exudate, or lesions. Pharyngeal walls symmetrical. Uvula midline. Tongue midline with left lateral tongue lesion that is well circumscribed, pink, without ulceration 1cm x 0.5 cm pedunculated Neck: Trachea midline. No masses. No thyromegaly or nodules palpated. No crepitus. Lymphatic: No lymphadenopathy in the neck. Respiratory: No stridor or distress. Room air. Cardiovascular: Regular rate and rhythm. Extremities: No edema or cyanosis. Warm and well-perfused. Skin: No scars or lesions on face or neck. Neurologic: Moving all extremities without gross abnormality. Other:  Independent Review of Additional Tests or Records: None Procedures: None Impression & Plans: [name] is a 43 y.o. female with left lateral tongue lesion consistent with a benign fibroma from dental trauma. Patient did also have elevated BP upon presentation - discussion with  patient revealed she has not taken her BP medication today.   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

## 2024-08-06 ENCOUNTER — Other Ambulatory Visit: Payer: Self-pay | Admitting: Family Medicine

## 2024-08-07 ENCOUNTER — Ambulatory Visit: Admitting: Family Medicine

## 2024-08-07 ENCOUNTER — Encounter: Payer: Self-pay | Admitting: Family Medicine

## 2024-08-07 VITALS — BP 129/83 | HR 83 | Temp 97.5°F | Ht 69.0 in | Wt 235.6 lb

## 2024-08-07 DIAGNOSIS — R930 Abnormal findings on diagnostic imaging of skull and head, not elsewhere classified: Secondary | ICD-10-CM

## 2024-08-07 DIAGNOSIS — G4452 New daily persistent headache (NDPH): Secondary | ICD-10-CM | POA: Diagnosis not present

## 2024-08-07 DIAGNOSIS — J358 Other chronic diseases of tonsils and adenoids: Secondary | ICD-10-CM | POA: Diagnosis not present

## 2024-08-07 NOTE — Progress Notes (Signed)
 Subjective:  Patient ID: Kaitlin Torres, female    DOB: [birth year], 43 y.o.   MRN: [id number]  Patient Care Team: Severa Rock HERO, FNP as PCP - General (Family Medicine) Tobb, Kardie, DO as PCP - Cardiology (Cardiology)   Chief Complaint:  Neck Pain (X 3 weeks- patient states when her neck pain starts it goes up into head.  Has been happening daily.  )   HPI: Kaitlin Torres is a 43 y.o. female presenting on 08/07/2024 for Neck Pain (X 3 weeks- patient states when her neck pain starts it goes up into head.  Has been happening daily.  )   Kaitlin Torres is a 43 year old female who presents with persistent headaches and neck pain.  She has been experiencing persistent headaches for the past three weeks. The pain originates in the neck and radiates to the base of her head. The onset of the headaches is sudden, and she has not identified any specific triggers or alleviating factors. Over-the-counter medications like Aleve , which previously provided relief, are no longer effective. She often resorts to sleeping to manage the pain.  Her medical history includes a previous abnormal CT scan of the head which revealed a cyst, which was evaluated by a neurologist in [place] . She has not established care with a neurologist since moving and has not been on any prescribed medications for her headaches in the past. She recalls that Aleve  used to help with her headaches, but it is no longer effective.  In addition to her headaches, she mentions a recent weight gain after initially losing weight on Wegovy . She is currently on a 1 mg dose and has noticed a plateau in her weight loss progress. Her menstrual cycle was due to start on the 11th of the month.          Relevant past medical, surgical, family, and social history reviewed and updated as indicated.  Allergies and medications reviewed and updated. Data reviewed: Chart in Epic.   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  Fibrocystic change.  Usual duct  epithelial hyperplasia.   CORONARY STENT INTERVENTION N/A 05/08/2019   Procedure: CORONARY STENT INTERVENTION;  Surgeon: Swaziland, Peter M, MD;  Location: [facility] INVASIVE CV LAB;  Service: Cardiovascular;  Laterality: N/A;   FRACTURE SURGERY  2005   right ankle   HEMORRHOID SURGERY     HEMORRHOID SURGERY  2015   INCISION AND DRAINAGE BREAST ABSCESS  09/2010   left breast    LEFT HEART CATH AND CORONARY ANGIOGRAPHY N/A 05/08/2019   Procedure: LEFT HEART CATH AND CORONARY ANGIOGRAPHY;  Surgeon: Swaziland, Peter M, MD;  Location: [facility] INVASIVE CV LAB;  Service: Cardiovascular;  Laterality: N/A;    Social History   Socioeconomic History   Marital status: Single    Spouse name: Not on file   Number of children: 3   Years of education: 14   Highest education level: Associate degree: academic  program  Occupational History   Occupation: Waitress  Tobacco Use   Smoking status: Former    Current packs/day: 0.00    Average packs/day: 0.1 packs/day for 26.0 years (2.6 ttl pk-yrs)    Types: Cigarettes    Start date: 09/15/1993    Quit date: 09/16/2019    Years since quitting: 4.8   Smokeless tobacco: Never  Vaping Use   Vaping status: Every Day   Substances: Nicotine, Flavoring  Substance and Sexual Activity   Alcohol use: No    Comment: quit 2018   Drug use: No   Sexual activity: Yes    Birth control/protection: None  Other Topics Concern   Not on file  Social History Narrative   Lives at home with her mother, husband and three daughters.    Right-handed.   Approximately 20 cups caffeine  per day (4-5 glasses holding 32oz of tea).   Social Drivers of Corporate investment banker Strain: Low Risk  (05/22/2024)   Overall Financial Resource Strain (CARDIA)    Difficulty of Paying Living Expenses: Not very hard  Food Insecurity: No Food Insecurity (05/22/2024)   Hunger Vital Sign    Worried About Running Out of Food in the Last Year: Never true    Ran Out of Food in the Last Year: Never true  Transportation Needs: No Transportation Needs (05/22/2024)   PRAPARE - Administrator, Civil Service (Medical): No    Lack of Transportation (Non-Medical): No  Physical Activity: Sufficiently Active (05/22/2024)   Exercise Vital Sign    Days of Exercise per Week: 4 days    Minutes of Exercise per Session: 60 min  Stress: No Stress Concern Present (05/22/2024)   [employer] of Occupational Health - Occupational Stress Questionnaire    Feeling of Stress: Not at all  Social Connections: Moderately Isolated (05/22/2024)   Social Connection and Isolation Panel    Frequency of Communication with Friends and Family: More than three times a week    Frequency of Social Gatherings with Friends and Family: More than three times a week    Attends Religious Services: Never    Database administrator or Organizations: No    Attends Engineer, structural: Not on file    Marital Status: Living with partner  Intimate Partner Violence: Not At Risk (01/30/2024)   Humiliation, Afraid, Rape, and Kick questionnaire    Fear of Current or Ex-Partner: No    Emotionally Abused: No    Physically Abused: No    Sexually Abused: No    Outpatient Encounter Medications as of 08/07/2024  Medication Sig   aspirin  EC 81 MG tablet Take 2 tablets (162 mg total) by mouth daily. Swallow whole.   aspirin -acetaminophen -caffeine  (EXCEDRIN MIGRAINE) 250-250-65 MG tablet Take 2 tablets by mouth every 6 (six) hours as needed for headache.   atorvastatin   (LIPITOR ) 80 MG tablet Take 1 tablet (80 mg total) by mouth daily.   enalapril  (VASOTEC ) 10 MG tablet Take 0.5 tablets (5 mg total) by mouth daily.   labetalol  (NORMODYNE ) 300 MG tablet TAKE 1 TABLET BY MOUTH THREE TIMES DAILY   Semaglutide -Weight Management 1 MG/0.5ML SOAJ Inject 1 mg into the skin once a week for 28 days.   [START ON 08/17/2024] Semaglutide -Weight Management 1.7 MG/0.75ML SOAJ Inject 1.7 mg into the skin once a week for 28 days. (Patient not taking: Reported on 08/07/2024)   [START ON 09/15/2024] Semaglutide -Weight Management 2.4 MG/0.75ML SOAJ Inject 2.4 mg into the skin once  a week for 28 days. (Patient not taking: Reported on 08/07/2024)   [DISCONTINUED] fluticasone  (FLONASE ) 50 MCG/ACT nasal spray Place 2 sprays into both nostrils daily.   [DISCONTINUED] NIFEdipine  (ADALAT  CC) 60 MG 24 hr tablet Take 1 tablet (60 mg total) by mouth 2 (two) times daily.   No facility-administered encounter medications on file as of 08/07/2024.    Allergies  Allergen Reactions   Diclofenac  Hives   Poison Ivy Extract Other (See Comments)    poison ivy extract    Pertinent ROS per HPI, otherwise unremarkable      Objective:  BP 129/83   Pulse 83   Temp (!) 97.5 F (36.4 C)   Ht 5' 9 (1.753 m)   Wt 235 lb 9.6 oz (106.9 kg)   LMP 07/18/2024   SpO2 92%   BMI 34.79 kg/m    Wt Readings from Last 3 Encounters:  08/07/24 235 lb 9.6 oz (106.9 kg)  08/05/24 237 lb 12.8 oz (107.9 kg)  07/03/24 230 lb 9.6 oz (104.6 kg)    Physical Exam Vitals and nursing note reviewed.  Constitutional:      General: She is not in acute distress.    Appearance: Normal appearance. She is morbidly obese. She is not ill-appearing, toxic-appearing or diaphoretic.  HENT:     Head: Normocephalic and atraumatic.     Mouth/Throat:     Lips: Pink.     Mouth: Mucous membranes are moist.     Pharynx: Oropharynx is clear. No posterior oropharyngeal erythema.   Eyes:     Extraocular Movements: Extraocular  movements intact.     Pupils: Pupils are equal, round, and reactive to light.  Cardiovascular:     Rate and Rhythm: Normal rate and regular rhythm.     Heart sounds: Normal heart sounds.  Pulmonary:     Effort: Pulmonary effort is normal.     Breath sounds: Normal breath sounds.  Musculoskeletal:     Cervical back: Normal range of motion and neck supple. No rigidity.     Right lower leg: No edema.  Skin:    General: Skin is warm and dry.     Capillary Refill: Capillary refill takes less than 2 seconds.  Neurological:     General: No focal deficit present.     Mental Status: She is alert and oriented to person, place, and time.     Cranial Nerves: No cranial nerve deficit.     Sensory: No sensory deficit.     Motor: No weakness.     Coordination: Coordination normal.     Gait: Gait normal.     Deep Tendon Reflexes: Reflexes normal.  Psychiatric:        Mood and Affect: Mood normal.        Behavior: Behavior normal. Behavior is cooperative.        Thought Content: Thought content normal.        Judgment: Judgment normal.     Results for orders placed or performed in visit on 07/03/24  CBC with Differential/Platelet   Collection Time: 07/03/24 12:12 PM  Result Value Ref Range   WBC 7.5 3.4 - 10.8 x10E3/uL   RBC 5.26 3.77 - 5.28 x10E6/uL   Hemoglobin 13.8 11.1 - 15.9 g/dL   Hematocrit 57.1 65.9 - 46.6 %   MCV 81 79 - 97 fL   MCH 26.2 (L) 26.6 - 33.0 pg   MCHC 32.2 31.5 - 35.7 g/dL   RDW 87.2 88.2 - 84.5 %  Platelets 321 150 - 450 x10E3/uL   Neutrophils 64 Not Estab. %   Lymphs 27 Not Estab. %   Monocytes 6 Not Estab. %   Eos 2 Not Estab. %   Basos 1 Not Estab. %   Neutrophils Absolute 4.8 1.4 - 7.0 x10E3/uL   Lymphocytes Absolute 2.0 0.7 - 3.1 x10E3/uL   Monocytes Absolute 0.5 0.1 - 0.9 x10E3/uL   EOS (ABSOLUTE) 0.1 0.0 - 0.4 x10E3/uL   Basophils Absolute 0.1 0.0 - 0.2 x10E3/uL   Immature Granulocytes 0 Not Estab. %   Immature Grans (Abs) 0.0 0.0 - 0.1 x10E3/uL   CMP14+EGFR   Collection Time: 07/03/24 12:12 PM  Result Value Ref Range   Glucose 88 70 - 99 mg/dL   BUN 17 6 - 24 mg/dL   Creatinine, Ser 9.21 0.57 - 1.00 mg/dL   eGFR 98 >40 fO/fpw/8.26   BUN/Creatinine Ratio 22 9 - 23   Sodium 137 134 - 144 mmol/L   Potassium 4.2 3.5 - 5.2 mmol/L   Chloride 103 96 - 106 mmol/L   CO2 20 20 - 29 mmol/L   Calcium  9.8 8.7 - 10.2 mg/dL   Total Protein 6.8 6.0 - 8.5 g/dL   Albumin 4.4 3.9 - 4.9 g/dL   Globulin, Total 2.4 1.5 - 4.5 g/dL   Bilirubin Total 0.6 0.0 - 1.2 mg/dL   Alkaline Phosphatase 76 44 - 121 IU/L   AST 28 0 - 40 IU/L   ALT 35 (H) 0 - 32 IU/L  Thyroid  Panel With TSH   Collection Time: 07/03/24 12:12 PM  Result Value Ref Range   TSH 1.470 0.450 - 4.500 uIU/mL   T4, Total 6.9 4.5 - 12.0 ug/dL   T3 Uptake Ratio 24 24 - 39 %   Free Thyroxine Index 1.7 1.2 - 4.9       Pertinent labs & imaging results that were available during my care of the patient were reviewed by me and considered in my medical decision making.  Assessment & Plan:  Kaitlin Torres was seen today for neck pain.  Diagnoses and all orders for this visit:  New daily persistent headache -     Ambulatory referral to Neurology -     CT HEAD WO CONTRAST ( ); Future  Abnormal CT of the head -     Ambulatory referral to Neurology -     CT HEAD WO CONTRAST ( ); Future  Tonsil stone To follow up with ENT. Discussed how to remove at home.   Morbid obesity (HCC) Diet and exercise encouraged.        Headache Chronic headaches for three weeks, originating at the base of the skull and radiating to the back of the head. Previous imaging showed an abnormal CT scan. No current neurology follow-up. Over-the-counter medications like Aleve  are ineffective. No prior use of migraine-specific medications. - Ordered CT scan of the head - Referred to neurology urgently - Provided headache diary for tracking - Provided Nurtec samples for abortive therapy, 2 packs provided and pt  instructed on proper use and potential side effects.   Obesity Weight gain with current weight at 235 lbs, previously 130 lbs. Currently on Wegovy  1 mg with a plateau in weight loss. Potential water retention due to menstrual cycle. Medicaid coverage for Wegovy  may be affected. - Monitor weight changes with dose increase to 1.7 mg - Discuss potential plateau in weight loss with dose increase - Check Medicaid coverage for Wegovy   Tonsil stones Presence of tonsil stones with  difficulty in self-removal due to inability to visualize the tonsils. Previous ENT consultation did not address tonsil stones. - Recommended use of a water pick for tonsil stone removal - Advised follow-up with ENT regarding tonsil stones          Continue all other maintenance medications.  Follow up plan: Return if symptoms worsen or fail to improve.   Continue healthy lifestyle choices, including diet (rich in fruits, vegetables, and lean proteins, and low in salt and simple carbohydrates) and exercise (at least 30 minutes of moderate physical activity daily).  Educational handout given for headache diary  The above assessment and management plan was discussed with the patient. The patient verbalized understanding of and has agreed to the management plan. Patient is aware to call the clinic if they develop any new symptoms or if symptoms persist or worsen. Patient is aware when to return to the clinic for a follow-up visit. Patient educated on when it is appropriate to go to the emergency department.   Rosaline Bruns, FNP-C [facility] [phone]

## 2024-08-15 ENCOUNTER — Ambulatory Visit (HOSPITAL_COMMUNITY)
Admission: RE | Admit: 2024-08-15 | Discharge: 2024-08-15 | Disposition: A | Discharge status: Home / Self Care | Source: Ambulatory Visit | Attending: Family Medicine | Admitting: Family Medicine

## 2024-08-15 DIAGNOSIS — R930 Abnormal findings on diagnostic imaging of skull and head, not elsewhere classified: Secondary | ICD-10-CM | POA: Insufficient documentation

## 2024-08-15 DIAGNOSIS — G4452 New daily persistent headache (NDPH): Secondary | ICD-10-CM | POA: Insufficient documentation

## 2024-08-16 ENCOUNTER — Ambulatory Visit: Payer: Self-pay | Admitting: Family Medicine

## 2024-09-04 ENCOUNTER — Ambulatory Visit (INDEPENDENT_AMBULATORY_CARE_PROVIDER_SITE_OTHER)

## 2024-09-04 ENCOUNTER — Other Ambulatory Visit (HOSPITAL_COMMUNITY)
Admission: RE | Admit: 2024-09-04 | Discharge: 2024-09-04 | Disposition: A | Discharge status: Home / Self Care | Source: Ambulatory Visit

## 2024-09-04 VITALS — BP 137/93 | HR 84 | Temp 97.8°F | Ht 69.0 in | Wt 234.0 lb

## 2024-09-04 DIAGNOSIS — D101 Benign neoplasm of tongue: Secondary | ICD-10-CM | POA: Diagnosis not present

## 2024-09-04 NOTE — Progress Notes (Signed)
 HPI:   08/05/2024 Kaitlin Torres is a 43 y.o. female who presents as a new patient presenting for consultation regarding a lateral tongue lesion. Patient states that approximately 1-2 months ago she woke up and noted she was biting down on her tongue. After assessing the area she noted a lesion on the left side of her tongue. This remained the same after several days which prompted her to seek evaluation with her PCP who referred her to us . Patient states that this lesion will get bigger and smaller occasionally. States that it is not painful, no stinging, burning, or bleeding. States that it is bothersome due to the location as it is in line with her bite and she frequently does end up biting it while she is eating. She denies any history of similar lesions. She is a former smoker however does not currently smoke. Denies other ENT complaints at this time.   Today (09/04/2024) Patient returns for follow-up regarding tongue lesion. Denies any changes in tongue lesion or symptoms. Would like to proceed with excision.   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  Fibrocystic change.  Usual duct  epithelial hyperplasia.   CORONARY STENT INTERVENTION N/A 05/08/2019   Procedure: CORONARY STENT INTERVENTION;  Surgeon: Jordan, Peter M, MD;  Location: [facility] INVASIVE CV LAB;  Service: Cardiovascular;  Laterality: N/A;   FRACTURE SURGERY  2005   right ankle   HEMORRHOID SURGERY     HEMORRHOID SURGERY  2015   INCISION AND DRAINAGE BREAST ABSCESS   09/2010   left breast    LEFT HEART CATH AND CORONARY ANGIOGRAPHY N/A 05/08/2019   Procedure: LEFT HEART CATH AND CORONARY ANGIOGRAPHY;  Surgeon: Jordan, Peter M, MD;  Location: [facility] INVASIVE CV LAB;  Service: Cardiovascular;  Laterality: N/A;   No family history of bleeding disorders, wound healing problems or difficulty with anesthesia.  Social Connections: Moderately Isolated (05/22/2024)   Social Connection and Isolation Panel    Frequency of Communication with Friends and Family: More than three times a week    Frequency of Social Gatherings with Friends and Family: More than three times a week    Attends Religious Services: Never    Database Administrator or Organizations: No    Attends Engineer, Structural: Not on file    Marital Status: Living with partner    Current Outpatient Medications:    aspirin  EC 81 MG tablet, Take 2 tablets (162 mg total) by mouth daily. Swallow whole., Disp: 180 tablet, Rfl: 2   aspirin -acetaminophen -caffeine  (EXCEDRIN MIGRAINE) 250-250-65 MG tablet, Take 2 tablets by mouth every 6 (six) hours as needed for headache., Disp: , Rfl:    atorvastatin  (LIPITOR ) 80 MG tablet, Take 1 tablet (80 mg total) by mouth daily., Disp: 30 tablet, Rfl: 0   labetalol  (NORMODYNE ) 300 MG tablet, TAKE 1 TABLET BY MOUTH THREE TIMES DAILY, Disp: 90 tablet, Rfl: 0   enalapril  (VASOTEC ) 10 MG tablet, Take 0.5 tablets (5 mg  total) by mouth daily. (Patient not taking: Reported on 09/04/2024), Disp: 15 tablet, Rfl: 0   Semaglutide -Weight Management 1.7 MG/0.75ML SOAJ, Inject 1.7 mg into the skin once a week for 28 days. (Patient not taking: Reported on 09/04/2024), Disp: 2 mL, Rfl: 0   [START ON 09/15/2024] Semaglutide -Weight Management 2.4 MG/0.75ML SOAJ, Inject 2.4 mg into the skin once a week for 28 days. (Patient not taking: Reported on 09/04/2024), Disp: 2 mL, Rfl: 0 A complete ROS was performed with pertinent positives/negatives noted in the HPI. The remainder of the ROS are  negative.   Physical Exam:  BP (!) 137/93 (BP Location: Left Arm, Patient Position: Sitting, Cuff Size: Normal)   Pulse 84   Temp 97.8 F (36.6 C) (Oral)   Ht 5' 9 (1.753 m)   Wt 234 lb (106.1 kg)   LMP 07/18/2024   SpO2 98%   BMI 34.56 kg/m  General: Well developed, well nourished. No acute distress. Voice normal Head/Face: Normocephalic. No sinus tenderness. Facial nerve intact and equal bilaterally. No facial lacerations. Eyes: PERRL, no scleral icterus or conjunctival hemorrhage. EOMI. Ears: No gross deformity. Normal external canal. Tympanic membrane in tact bilaterally Hearing: Normal speech reception.  Nose: No gross deformity or lesions. No purulent discharge. No turbinate hypertrophy. Mouth/Oropharynx: Lips without any lesions. Dentition fair. No mucosal lesions within the oropharynx. 2+ tonsils bilaterally, no exudate, or lesions. Pharyngeal walls symmetrical. Uvula midline. Tongue midline with left lateral tongue lesion that is well circumscribed, pink, without ulceration 1cm x 0.5 cm pedunculated Neck: Trachea midline. No masses. No thyromegaly or nodules palpated. No crepitus. Lymphatic: No lymphadenopathy in the neck. Respiratory: No stridor or distress. Room air. Cardiovascular: Regular rate and rhythm. Extremities: No edema or cyanosis. Warm and well-perfused. Skin: No scars or lesions on face or neck. Neurologic: Moving all extremities without gross abnormality. Other:  Independent Review of Additional Tests or Records: None Procedures:  Excision of tongue lesion CPT 41111  Procedure Note  Patient: Kaitlin Torres             Date of Birth: 1981/09/16           MRN: [id number]             Visit Date: 09/04/2024  Procedures: Tongue lesion excision  Date/Time: 09/04/2024 12:44 PM  Performed by: Mila Adah SAUNDERS, DO Authorized by: Mila Adah SAUNDERS, DO   Consent:    Consent obtained:  Verbal and written   Consent given by:  Patient   Risks discussed:   Bleeding, infection and pain   Alternatives discussed:  No treatment and observation Pre-procedure details:    Skin preparation:  None Sedation:    Sedation type:  None Anesthesia:    Anesthesia method:  Local infiltration   Local anesthetic:  Lidocaine  1% WITH epi Procedure Details:    Location:  Tongue Post-procedure details:    Patient tolerance of procedure:  Tolerated well, no immediate complications Comments:     Left lateral tongue lesion 1cmx0.5xm pedunculated. Local infiltration with 2cc of 1% Lidocaine  with epinephrine  was used. Sharp excision of lesion with 15 blade and scissors. Hemostasis achieved with Hi-temp cautery and 1 figure of 8 4-0 vicryl stitch.   Impression & Plans: Kaitlin Torres is a 43 y.o. female with left lateral tongue lesion consistent with a benign fibroma from dental trauma. Patient did also have elevated BP upon presentation - discussion with patient revealed she has not taken her BP medication today.   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   Adah Malkin, DO [facility]

## 2024-09-06 LAB — SURGICAL PATHOLOGY

## 2024-09-08 ENCOUNTER — Other Ambulatory Visit: Payer: Self-pay | Admitting: *Deleted

## 2024-10-08 ENCOUNTER — Encounter: Payer: Self-pay | Admitting: Family Medicine

## 2024-10-08 ENCOUNTER — Ambulatory Visit: Payer: Self-pay | Admitting: Family Medicine

## 2024-10-08 VITALS — BP 122/75 | HR 77 | Temp 96.1°F | Ht 69.0 in | Wt 233.0 lb

## 2024-10-08 DIAGNOSIS — Z6832 Body mass index (BMI) 32.0-32.9, adult: Secondary | ICD-10-CM

## 2024-10-08 DIAGNOSIS — E782 Mixed hyperlipidemia: Secondary | ICD-10-CM | POA: Diagnosis not present

## 2024-10-08 DIAGNOSIS — I10 Essential (primary) hypertension: Secondary | ICD-10-CM

## 2024-10-08 DIAGNOSIS — I252 Old myocardial infarction: Secondary | ICD-10-CM | POA: Diagnosis not present

## 2024-10-08 MED ORDER — ZEPBOUND 2.5 MG/0.5ML ~~LOC~~ SOAJ: 2.5000 mg | SUBCUTANEOUS | 3 refills | Status: AC

## 2024-10-08 NOTE — Progress Notes (Signed)
 Subjective:  Patient ID: Kaitlin Torres, female    DOB: 1980/11/22, 43 y.o.   MRN: [id number]  Patient Care Team: Severa Rock HERO, FNP as PCP - General (Family Medicine) Tobb, Kardie, DO as PCP - Cardiology (Cardiology)   Chief Complaint:  BMI (3 month follow up - other options )   HPI: Kaitlin Torres is a 43 y.o. female presenting on 10/08/2024 for BMI (3 month follow up - other options )   Kaitlin Torres is a 43 year old female with a history of heart attack, high cholesterol, and hypertension who presents for a BMI follow-up.  She has been experiencing difficulty obtaining Wegovy  due to insurance coverage issues. Previously, she lost seven pounds on Wegovy  but has since regained five pounds.  She has a history of attending weight management clinics where she was prescribed phentermine.  She mentions a colleague who has had success with Zepbound, another weight management medication.  Medications like Contrave are also not covered by insurance and are cost-prohibitive.  She has a history of a heart attack, high cholesterol, and hypertension.          Relevant past medical, surgical, family, and social history reviewed and updated as indicated.  Allergies and medications reviewed and updated. Data reviewed: Chart in Epic.   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  Fibrocystic change.  Usual duct  epithelial hyperplasia.   CORONARY STENT INTERVENTION N/A 05/08/2019   Procedure: CORONARY STENT INTERVENTION;  Surgeon: Jordan, Peter M, MD;   Location: [facility] INVASIVE CV LAB;  Service: Cardiovascular;  Laterality: N/A;   FRACTURE SURGERY  2005   right ankle   HEMORRHOID SURGERY     HEMORRHOID SURGERY  2015   INCISION AND DRAINAGE BREAST ABSCESS  09/2010   left breast    LEFT HEART CATH AND CORONARY ANGIOGRAPHY N/A 05/08/2019   Procedure: LEFT HEART CATH AND CORONARY ANGIOGRAPHY;  Surgeon: Jordan, Peter M, MD;  Location: [facility] INVASIVE CV LAB;  Service: Cardiovascular;  Laterality: N/A;    Social History   Socioeconomic History   Marital status: Single    Spouse name: Not on file   Number of children: 3   Years of education: 14   Highest education level: Associate degree: academic program  Occupational History   Occupation: Waitress  Tobacco Use   Smoking status: Former    Current packs/day: 0.00    Average packs/day: 0.1 packs/day for 26.0 years (2.6 ttl pk-yrs)    Types: Cigarettes    Start date: 09/15/1993    Quit date: 09/16/2019    Years since quitting: 5.0   Smokeless tobacco: Never  Vaping Use   Vaping status: Every Day   Substances: Nicotine, Flavoring  Substance and Sexual Activity   Alcohol use: No    Comment: quit 2018   Drug use: No   Sexual activity: Yes    Birth control/protection: None  Other Topics Concern   Not on file  Social History Narrative   Lives at home with her mother, husband and three daughters.   Right-handed.   Approximately 20 cups caffeine  per day (4-5 glasses holding 32oz of tea).   Social Drivers of Corporate Investment Banker Strain: Low Risk  (05/22/2024)   Overall Financial Resource Strain (CARDIA)    Difficulty of Paying Living Expenses: Not very hard  Food Insecurity: No Food Insecurity (05/22/2024)   Hunger Vital Sign    Worried About Running Out of Food in the Last Year: Never true    Ran Out of Food in the Last Year: Never true  Transportation Needs: No Transportation Needs (05/22/2024)   PRAPARE - Administrator, Civil Service (Medical): No    Lack of  Transportation (Non-Medical): No  Physical Activity: Sufficiently Active (05/22/2024)   Exercise Vital Sign    Days of Exercise per Week: 4 days    Minutes of Exercise per Session: 60 min  Stress: No Stress Concern Present (05/22/2024)   [employer] of Occupational Health - Occupational Stress Questionnaire    Feeling of Stress: Not at all  Social Connections: Moderately Isolated (05/22/2024)   Social Connection and Isolation Panel    Frequency of Communication with Friends and Family: More than three times a week    Frequency of Social Gatherings with Friends and Family: More than three times a week    Attends Religious Services: Never    Database Administrator or Organizations: No    Attends Engineer, Structural: Not on file    Marital Status: Living with partner  Intimate Partner Violence: Not At Risk (01/30/2024)   Humiliation, Afraid, Rape, and Kick questionnaire    Fear of Current or Ex-Partner: No    Emotionally Abused: No    Physically Abused: No    Sexually Abused: No    Outpatient Encounter Medications as of 10/08/2024  Medication Sig   aspirin  EC 81 MG tablet Take 2 tablets (162 mg total) by mouth daily. Swallow whole.   aspirin -acetaminophen -caffeine  (EXCEDRIN MIGRAINE) 250-250-65 MG tablet Take 2 tablets by mouth every 6 (six) hours as needed for headache.   atorvastatin  (LIPITOR ) 80 MG tablet Take 1 tablet (80 mg total) by mouth daily.   labetalol  (NORMODYNE ) 300 MG tablet TAKE 1 TABLET BY MOUTH THREE TIMES DAILY   tirzepatide (ZEPBOUND) 2.5 MG/0.5ML Pen Inject 2.5 mg into the skin once a week for 4 doses.   [DISCONTINUED] enalapril  (VASOTEC ) 10 MG tablet Take 0.5 tablets (5 mg total) by mouth daily. (Patient not taking: Reported on 09/04/2024)   [DISCONTINUED] Semaglutide -Weight Management 2.4 MG/0.75ML SOAJ Inject 2.4 mg into the skin once a week for 28 days. (Patient not taking: Reported on 09/04/2024)   No facility-administered encounter medications on  file as of 10/08/2024.    Allergies  Allergen Reactions   Diclofenac  Hives   Poison Ivy Extract Other (See Comments)    poison ivy extract    Pertinent ROS per HPI, otherwise unremarkable      Objective:  BP 122/75   Pulse 77   Temp (!) 96.1 F (35.6 C)   Ht 5' 9 (1.753 m)   Wt 233 lb (105.7 kg)   LMP 09/29/2024   SpO2 97%   BMI 34.41 kg/m    Wt Readings from Last 3 Encounters:  10/08/24 233 lb (105.7 kg)  09/04/24 234 lb (106.1 kg)  08/07/24 235 lb 9.6 oz (106.9 kg)    Physical Exam Vitals and nursing note reviewed.  Constitutional:      General: She is not in acute distress.    Appearance: Normal appearance. She is well-developed and well-groomed. She is not ill-appearing, toxic-appearing or diaphoretic.  HENT:     Head: Normocephalic and atraumatic.     Jaw: There is normal jaw occlusion.     Right Ear: Hearing normal.     Left Ear: Hearing normal.     Nose: Nose normal.     Mouth/Throat:     Lips: Pink.     Mouth: Mucous membranes are moist.     Pharynx: Uvula midline.  Eyes:     General: Lids are normal.     Extraocular Movements: Extraocular movements intact.     Pupils: Pupils are equal, round, and reactive to light.  Neck:     Trachea: Trachea and phonation normal.  Cardiovascular:     Rate and Rhythm: Normal rate and regular rhythm.     Chest Wall: PMI is not displaced.     Pulses: Normal pulses.     Heart sounds: Normal heart sounds. No murmur heard.    No friction rub. No gallop.  Pulmonary:     Effort: Pulmonary effort is normal.     Breath sounds: Normal breath sounds.  Abdominal:     General: Bowel sounds are normal.     Palpations: Abdomen is soft.  Musculoskeletal:        General: Normal range of motion.     Cervical back: Normal range of motion and neck supple.     Right lower leg: No edema.     Left lower leg: No edema.  Skin:    General: Skin is warm and dry.     Capillary Refill: Capillary refill takes less than 2 seconds.      Coloration: Skin is not cyanotic, jaundiced or pale.     Findings: No rash.  Neurological:     General: No focal deficit present.     Mental Status: She is alert and oriented to person, place, and time.     Sensory: Sensation is intact.     Motor: Motor function is intact.     Coordination: Coordination is intact.     Gait: Gait is intact.     Deep Tendon Reflexes: Reflexes are normal and symmetric.  Psychiatric:        Attention and Perception: Attention and perception normal.        Mood and Affect: Mood and affect normal.        Speech: Speech normal.        Behavior: Behavior normal. Behavior is cooperative.        Thought Content: Thought content normal.        Cognition and Memory: Cognition and memory normal.        Judgment: Judgment normal.    Physical Exam            Results for orders placed or performed in visit on 09/04/24  Surgical pathology( [facility])   Collection Time: 09/04/24 12:37 PM  Result Value Ref Range   SURGICAL PATHOLOGY      SURGICAL PATHOLOGY CASE: [id number] PATIENT: Kaitlin Torres Surgical Pathology Report     Clinical History: fibroma of tongue (cm)     FINAL MICROSCOPIC DIAGNOSIS:  A. TONGUE, BIOPSY: Benign oral fibroma. Negative for dysplasia and malignancy.   GROSS DESCRIPTION: A. Received in formalin labeled with the patients name and DOB is a 0.5 cm white-tan, rubbery piece of tissue  that is inked black, bisected, and entirely submitted in a single cassette.  (LEF 09/04/2024)  Final Diagnosis performed by Norleen Dover, MD.   Electronically signed 09/05/2024 Technical and / or Professional components performed at [facility], [address] [place], [place] [address].  Immunohistochemistry Technical component (if applicable) was performed at [facility]. [address], [address], [place], [place] [address].   IMMUNOHISTOCHEMISTRY DISCLAIMER (if applicable): Some of  these immunohistochemical stains may have been developed and the  performance characteristics determine by [facility]. Some may not have been cleared or approved by the U.S. Food and Drug Administration. The FDA has determined that such clearance or approval is not necessary. This test is used for clinical purposes. It should not be regarded as investigational or for research. This laboratory is certified under the Clinical Laboratory Improvement Amendments of 1988 (CLIA-88) as qualified to perform high complexity clinical laboratory testing.  The controls stained appropriately.   IHC stains are performed on formalin fixed, paraffin embedded tissue using a 3,3diaminobenzidine (DAB) chromogen and Leica Bond Autostainer System. The staining intensity of the nucleus is score manually and is reported as the percentage of tumor cell nuclei demonstrating specific nuclear staining. The specimens are fixed in 10% Neutral Formalin for at least 6 hours and up to 72hrs. These tests are validated on decalcified tissue. Results shou ld be interpreted with caution given the possibility of false negative results on decalcified specimens. Antibody Clones are as follows ER-clone 64F, PR-clone 16, Ki67- clone MM1. Some of these immunohistochemical stains may have been developed and the performance characteristics determined by [facility].        Pertinent labs & imaging results that were available during my care of the patient were reviewed by me and considered in my medical decision making.  Assessment & Plan:  Mirissa was seen today for bmi.  Diagnoses and all orders for this visit:  History of non-ST elevation myocardial infarction (NSTEMI) -     CMP14+EGFR -     Vitamin D , 25-hydroxy -     Lipid panel -     tirzepatide  (ZEPBOUND ) 2.5 MG/0.5ML Pen; Inject 2.5 mg into the skin once a week for 4 doses.  Mixed hyperlipidemia -     CMP14+EGFR -     Vitamin D , 25-hydroxy -      Lipid panel -     tirzepatide  (ZEPBOUND ) 2.5 MG/0.5ML Pen; Inject 2.5 mg into the skin once a week for 4 doses.  Morbid obesity (HCC) -     CMP14+EGFR -     Vitamin D , 25-hydroxy -     Lipid panel -     tirzepatide  (ZEPBOUND ) 2.5 MG/0.5ML Pen; Inject 2.5 mg into the skin once a week for 4 doses.  Essential hypertension -     CMP14+EGFR -     Vitamin D , 25-hydroxy -     Lipid panel -     tirzepatide  (ZEPBOUND ) 2.5 MG/0.5ML Pen; Inject 2.5 mg into the skin once a week for 4 doses.     Morbid obesity Difficulty obtaining Wegovy  due to insurance coverage issues. Previous weight loss of 7 pounds with Wegovy , but regained 5 pounds. Phentermine is not recommended due to cardiac history. Alternative medications like Contrave are not covered by insurance and are cost-prohibitive. Zepbound  is considered effective but requires insurance approval. - Submitted prior authorization for Zepbound  linked to cardiovascular indications for insurance approval. - If Zepbound  is not approved, will consult with a  colleague in [place] for alternative approval options.  History of non-ST elevation myocardial infarction Non-ST elevation myocardial infarction with ongoing management of cardiovascular risk factors. Zepbound is considered for its cardioprotective benefits and secondary prevention potential. - Submitted prior authorization for Zepbound linked to cardiovascular indications for insurance approval.  Mixed hyperlipidemia Part of cardiovascular risk management. - Submitted prior authorization for Zepbound linked to cardiovascular indications for insurance approval.  Essential hypertension Part of cardiovascular risk management. - Submitted prior authorization for Zepbound linked to cardiovascular indications for insurance approval.          Continue all other maintenance medications.  Follow up plan: Return if symptoms worsen or fail to improve.   Continue healthy lifestyle choices,  including diet (rich in fruits, vegetables, and lean proteins, and low in salt and simple carbohydrates) and exercise (at least 30 minutes of moderate physical activity daily).  Educational handout given for tirzepatide   The above assessment and management plan was discussed with the patient. The patient verbalized understanding of and has agreed to the management plan. Patient is aware to call the clinic if they develop any new symptoms or if symptoms persist or worsen. Patient is aware when to return to the clinic for a follow-up visit. Patient educated on when it is appropriate to go to the emergency department.   Rosaline Bruns, FNP-C [facility] [phone]

## 2024-10-09 ENCOUNTER — Ambulatory Visit: Payer: Self-pay | Admitting: Family Medicine

## 2024-10-09 LAB — CMP14+EGFR
ALT: 24 IU/L (ref 0–32)
AST: 22 IU/L (ref 0–40)
Albumin: 4.2 g/dL (ref 3.9–4.9)
Alkaline Phosphatase: 70 IU/L (ref 41–116)
BUN/Creatinine Ratio: 19 (ref 9–23)
BUN: 12 mg/dL (ref 6–24)
Bilirubin Total: 0.4 mg/dL (ref 0.0–1.2)
CO2: 20 mmol/L (ref 20–29)
Calcium: 9.3 mg/dL (ref 8.7–10.2)
Chloride: 104 mmol/L (ref 96–106)
Creatinine, Ser: 0.62 mg/dL (ref 0.57–1.00)
Globulin, Total: 2.6 g/dL (ref 1.5–4.5)
Glucose: 100 mg/dL — ABNORMAL HIGH (ref 70–99)
Potassium: 3.9 mmol/L (ref 3.5–5.2)
Sodium: 136 mmol/L (ref 134–144)
Total Protein: 6.8 g/dL (ref 6.0–8.5)
eGFR: 114 mL/min/1.73 (ref >59)

## 2024-10-09 LAB — LIPID PANEL
Chol/HDL Ratio: 5.2 ratio — ABNORMAL HIGH (ref 0.0–4.4)
Cholesterol, Total: 186 mg/dL (ref 100–199)
HDL: 36 mg/dL — ABNORMAL LOW (ref >39)
LDL Chol Calc (NIH): 123 mg/dL — ABNORMAL HIGH (ref 0–99)
Triglycerides: 148 mg/dL (ref 0–149)
VLDL Cholesterol Cal: 27 mg/dL (ref 5–40)

## 2024-10-09 LAB — VITAMIN D 25 HYDROXY (VIT D DEFICIENCY, FRACTURES): Vit D, 25-Hydroxy: 12 ng/mL — ABNORMAL LOW (ref 30.0–100.0)

## 2024-10-13 ENCOUNTER — Encounter: Payer: Self-pay | Admitting: Family Medicine

## 2024-10-14 MED ORDER — ATORVASTATIN CALCIUM 80 MG PO TABS: 80.0000 mg | ORAL_TABLET | Freq: Every day | ORAL | 0 refills | Status: DC

## 2024-10-24 DIAGNOSIS — R509 Fever, unspecified: Secondary | ICD-10-CM | POA: Diagnosis not present

## 2024-10-24 DIAGNOSIS — R070 Pain in throat: Secondary | ICD-10-CM | POA: Diagnosis not present

## 2024-10-24 DIAGNOSIS — J069 Acute upper respiratory infection, unspecified: Secondary | ICD-10-CM | POA: Diagnosis not present

## 2024-10-24 DIAGNOSIS — R051 Acute cough: Secondary | ICD-10-CM | POA: Diagnosis not present

## 2024-11-08 ENCOUNTER — Telehealth: Payer: Self-pay

## 2024-11-08 NOTE — Telephone Encounter (Signed)
 Pharmacy Patient Advocate Encounter   Received notification from Onbase that prior authorization for Wegovy  0.25MG /0.5ML auto-injectors  is due for renewal.   Insurance verification completed.   The patient is insured through [employer].  Action: Medication has been discontinued. Archived Key: [id number]

## 2024-11-16 ENCOUNTER — Other Ambulatory Visit: Payer: Self-pay | Admitting: Family Medicine

## 2024-11-25 ENCOUNTER — Telehealth: Payer: Self-pay | Admitting: *Deleted

## 2024-11-25 DIAGNOSIS — I10 Essential (primary) hypertension: Secondary | ICD-10-CM

## 2024-11-25 DIAGNOSIS — E782 Mixed hyperlipidemia: Secondary | ICD-10-CM

## 2024-11-25 NOTE — Progress Notes (Signed)
 Complex Care Management Note  Care Guide Note 11/25/2024 Name: JIMMYE WISNIESKI MRN: [id number] DOB: Jul 12, 1981  BRYER GOTTSCH is a 44 y.o. year old female who sees Rakes, Rock HERO, FNP for primary care. I reached out to [name] by phone today to offer complex care management services.  Ms. Daring was given information about Complex Care Management services today including:   The Complex Care Management services include support from the care team which includes your Nurse Care Manager, Clinical Social Worker, or Pharmacist.  The Complex Care Management team is here to help remove barriers to the health concerns and goals most important to you. Complex Care Management services are voluntary, and the patient may decline or stop services at any time by request to their care team member.   Complex Care Management Consent Status: Patient agreed to services and verbal consent obtained.   Follow up plan:  Telephone appointment with complex care management team member scheduled for:  12/04/24  Encounter Outcome:  Patient Scheduled  Harlene Satterfield  [facility]  [facility], [facility] Guide  Direct Dial : [phone]  Fax [phone]

## 2024-12-03 ENCOUNTER — Encounter: Payer: Self-pay | Admitting: Family Medicine

## 2024-12-04 ENCOUNTER — Other Ambulatory Visit: Payer: Self-pay | Admitting: *Deleted

## 2024-12-04 ENCOUNTER — Other Ambulatory Visit: Payer: Self-pay | Admitting: Family Medicine

## 2024-12-04 DIAGNOSIS — I10 Essential (primary) hypertension: Secondary | ICD-10-CM

## 2024-12-04 MED ORDER — COMFORT TOUCH BP CUFF/LARGE MISC: 1.0000 | Freq: Three times a day (TID) | 0 refills | Status: AC

## 2024-12-04 NOTE — Patient Instructions (Signed)
 Visit Information  [name] was given information about Medicaid Managed Care team care coordination services as a part of their Healthy [facility] Medicaid benefit. Kaitlin Torres   If you would like to schedule transportation through your Healthy [facility] plan, please call the following number at least 2 days in advance of your appointment: [phone]  For information about your ride after you set it up, call Ride Assist at [phone]. Use this number to activate a Will Call pickup, or if your transportation is late for a scheduled pickup. Use this number, too, if you need to make a change or cancel a previously scheduled reservation.  If you need transportation services right away, call [phone]. The after-hours call center is staffed 24 hours to handle ride assistance and urgent reservation requests (including discharges) 365 days a year. Urgent trips include sick visits, hospital discharge requests and life-sustaining treatment.  Call the [facility] Line at [phone], at any time, 24 hours a day, 7 days a week. If you are in danger or need immediate medical attention call 911.   Please see education materials related to Hypertension, Hyperlipidemia provided by MyChart link.  Care plan and visit instructions communicated with the patient verbally today. Patient agrees to receive a copy in MyChart. Active MyChart status and patient understanding of how to access instructions and care plan via MyChart confirmed with patient.     Telephone follow up appointment with Managed Medicaid care management team member scheduled for:12-18-2024 at 11:00 am  Kaitlin Torres, BSN RN [facility], [facility] RN Care Manager Direct Dial : [phone]  Fax: [phone]   Following is a copy of your plan of care:   Goals Addressed             This Visit's Progress    VBCI RN Care Plan - HLD       Problems:  Chronic Disease Management  support and education needs related to HLD  Goal: Over the next 90 days the Patient will attend all scheduled medical appointments: with primary care provider and specialist as evidenced by keeping all scheduled appointments        continue to work with RN Care Manager and/or Social Worker to address care management and care coordination needs related to HLD as evidenced by adherence to care management team scheduled appointments     take all medications exactly as prescribed and will call provider for medication related questions as evidenced by compliance with all medications    verbalize basic understanding of HLD disease process and self health management plan as evidenced by verbal explanation, recognizing/monitoring symptoms, lifestyle modifications  Interventions:   Hyperlipidemia Interventions: Medication review performed; medication list updated in electronic medical record.  Provider established cholesterol goals reviewed Counseled on importance of regular laboratory monitoring as prescribed Provided HLD educational materials Reviewed role and benefits of statin for ASCVD risk reduction Discussed strategies to manage statin-induced myalgias Reviewed importance of limiting foods high in cholesterol Reviewed exercise goals and target of 150 minutes per week Screening for signs and symptoms of depression related to chronic disease state Assessed social determinant of health barriers   Patient Self-Care Activities:  Attend all scheduled provider appointments Take medications as prescribed   call for medicine refill 2 or 3 days before it runs out take all medications exactly as prescribed call doctor with any symptoms you believe are related to your medicine call doctor when you experience any new symptoms  Plan:  Telephone follow  up appointment with care management team member scheduled for:  12-18-2024 at 2:00 pm            VBCI RN Care Plan - HTN       Problems:  Chronic  Disease Management support and education needs related to HTN  Goal: Over the next 90 days the Patient will attend all scheduled medical appointments: with primary care provider and specialist as evidenced by keeping all scheduled appointments        continue to work with RN Care Manager and/or Social Worker to address care management and care coordination needs related to HTN as evidenced by adherence to care management team scheduled appointments     take all medications exactly as prescribed and will call provider for medication related questions as evidenced by compliance with all medications    verbalize basic understanding of HTN disease process and self health management plan as evidenced by verbal explanation, recognizing/monitoring symptoms, lifestyle modifications  Interventions:   Hypertension Interventions: Last practice recorded BP readings:  BP Readings from Last 3 Encounters:  10/08/24 122/75  09/04/24 (!) 137/93  08/07/24 129/83   Most recent eGFR/CrCl:  Lab Results  Component Value Date   EGFR 114 10/08/2024    No components found for: CRCL  Evaluation of current treatment plan related to hypertension self management and patient's adherence to plan as established by provider Provided education to patient re: stroke prevention, s/s of heart attack and stroke Reviewed medications with patient and discussed importance of compliance Counseled on adverse effects of illicit drug and excessive alcohol use in patients with high blood pressure  Counseled on the importance of exercise goals with target of 150 minutes per week Discussed plans with patient for ongoing care management follow up and provided patient with direct contact information for care management team Advised patient, providing education and rationale, to monitor blood pressure daily and record, calling PCP for findings outside established parameters Reviewed scheduled/upcoming provider appointments including:   Advised patient to discuss elevated home BP readings with provider Provided education on prescribed diet DASH Discussed complications of poorly controlled blood pressure such as heart disease, stroke, circulatory complications, vision complications, kidney impairment, sexual dysfunction Screening for signs and symptoms of depression related to chronic disease state  Assessed social determinant of health barriers  Patient Self-Care Activities:  Attend all scheduled provider appointments Take medications as prescribed   check blood pressure daily take blood pressure log to all doctor appointments call doctor for signs and symptoms of high blood pressure take medications for blood pressure exactly as prescribed  Plan:  Telephone follow up appointment with care management team member scheduled for:  12-18-2024 at 11:00 am

## 2024-12-04 NOTE — Patient Outreach (Signed)
 Complex Care Management   Visit Note  12/04/2024  Name:  Kaitlin Torres MRN: [id number] DOB: 01-Feb-1981  Situation: Referral received for Complex Care Management related to HTN, HLD I obtained verbal consent from Patient.  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   Cough    Generalized headaches    Gestational diabetes    Heart attack (HCC) 04/2019   Hyperlipidemia    Hypertension    PCOS (polycystic ovarian syndrome)    Rectal pain    Vitamin D  deficiency 01/15/2021    Assessment: Patient Reported Symptoms:  Cognitive Cognitive Status: No symptoms reported Cognitive/Intellectual Conditions Management [RPT]: None reported or documented in medical history or problem list   Health Maintenance Behaviors: Annual physical exam Healing Pattern: Average Health Facilitated by: Rest  Neurological Neurological Review of Symptoms: No symptoms reported Neurological Management Strategies: Routine screening Neurological Self-Management Outcome: 5 (very good)  HEENT HEENT Symptoms Reported: No symptoms reported HEENT Management Strategies: Routine screening HEENT Self-Management Outcome: 5 (very good)    Cardiovascular Cardiovascular Symptoms Reported: No symptoms reported Does patient have uncontrolled Hypertension?: Yes Is patient checking Blood Pressure at home?: No Cardiovascular Management Strategies: Routine screening, Medication therapy Cardiovascular Self-Management Outcome: 4 (good)  Respiratory Respiratory Symptoms Reported: No symptoms reported Respiratory Management Strategies: Routine screening Respiratory Self-Management Outcome: 4 (good)  Endocrine Endocrine Symptoms Reported: No symptoms reported Is patient diabetic?: No Endocrine Self-Management Outcome: 4 (good)  Gastrointestinal Gastrointestinal Symptoms Reported: Constipation Gastrointestinal Self-Management Outcome: 5  (very good)    Genitourinary Genitourinary Symptoms Reported: No symptoms reported Genitourinary Self-Management Outcome: 5 (very good)  Integumentary Integumentary Symptoms Reported: Rash, Itching Skin Management Strategies: Routine screening Skin Self-Management Outcome: 5 (very good)  Musculoskeletal Musculoskelatal Symptoms Reviewed: Back pain Musculoskeletal Self-Management Outcome: 3 (uncertain) Falls in the past year?: No Number of falls in past year: 1 or less Was there an injury with Fall?: No Fall Risk Category Calculator: 0 Patient Fall Risk Level: Low Fall Risk Patient at Risk for Falls Due to: No Fall Risks Fall risk Follow up: Falls evaluation completed  Psychosocial Psychosocial Symptoms Reported: No symptoms reported Behavioral Management Strategies: Coping strategies Behavioral Health Self-Management Outcome: 4 (good) Major Change/Loss/Stressor/Fears (CP): Denies Techniques to Cope with Loss/Stress/Change: Diversional activities Quality of Family Relationships: supportive Do you feel physically threatened by others?: No    12/04/2024    PHQ2-9 Depression Screening   Little interest or pleasure in doing things Not at all  Feeling down, depressed, or hopeless Not at all  PHQ-2 - Total Score 0  Trouble falling or staying asleep, or sleeping too much    Feeling tired or having little energy    Poor appetite or overeating     Feeling bad about yourself - or that you are a failure or have let yourself or your family down    Trouble concentrating on things, such as reading the newspaper or watching television    Moving or speaking so slowly that other people could have noticed.  Or the opposite - being so fidgety or restless that you have been moving around a lot more than usual    Thoughts that you would be better off dead, or hurting yourself in some way    PHQ2-9 Total Score    If you checked off any problems, how difficult have these problems made it for you to do  your work, take  care of things at home, or get along with other people    Depression Interventions/Treatment      There were no vitals filed for this visit. Pain Scale: 0-10 Pain Score: 0-No pain  Medications Reviewed Today     Reviewed by Bertrum Rosina HERO, RN (Registered Nurse) on 12/04/24 at 1316  Med List Status: <None>   Medication Order Taking? Sig Documenting Provider Last Dose Status Informant  aspirin  EC 81 MG tablet [id number]  Take 2 tablets (162 mg total) by mouth daily. Swallow whole. Kizzie Suzen SAUNDERS, CNM  Active   aspirin -acetaminophen -caffeine  (EXCEDRIN MIGRAINE) 250-250-65 MG tablet [id number]  Take 2 tablets by mouth every 6 (six) hours as needed for headache. [provider]  Active   atorvastatin  (LIPITOR ) 80 MG tablet [id number]  Take 1 tablet by mouth once daily Severa Rock HERO, FNP  Active   labetalol  (NORMODYNE ) 300 MG tablet [id number]  TAKE 1 TABLET BY MOUTH THREE TIMES DAILY Rakes, Rock HERO, FNP  Active             Recommendation:   Continue Current Plan of Care  Follow Up Plan:   Telephone follow-up in 2 weeks  Rosina Bertrum, BSN RN [facility], [facility] RN Care Manager Direct Dial : [phone]  Fax: [phone]

## 2024-12-06 ENCOUNTER — Telehealth: Payer: Self-pay | Admitting: Pharmacist

## 2024-12-06 NOTE — Telephone Encounter (Signed)
 Patient would like to discuss GLP1 for Obesity.  She will need to see pharmacy to discuss.  [id number] placed to pharmacy.

## 2024-12-10 ENCOUNTER — Other Ambulatory Visit (HOSPITAL_COMMUNITY): Payer: Self-pay

## 2024-12-10 ENCOUNTER — Telehealth: Payer: Self-pay | Admitting: Pharmacist

## 2024-12-10 MED ORDER — SEMAGLUTIDE-WEIGHT MANAGEMENT 0.25 MG/0.5ML ~~LOC~~ SOAJ: 0.2500 mg | SUBCUTANEOUS | 0 refills | Status: AC

## 2024-12-10 NOTE — Telephone Encounter (Signed)
 Patient previously on Wegovy  for morbid obesity.  Medicaid has reinstated GLP-1 obesity coverage.  Will send in 1 month supply of Wegovy  0.25mg  weekly.  Encouraged patient to follow up for continued refills and progress.  Will likely require PA.  BMI 34.41 currently.  Mitsuko Luera Dattero Lafaye Mcelmurry, PharmD, BCACP, CPP Clinical Pharmacist, [facility]

## 2024-12-12 ENCOUNTER — Ambulatory Visit: Admitting: Neurology

## 2024-12-12 ENCOUNTER — Encounter: Payer: Self-pay | Admitting: Neurology

## 2024-12-12 VITALS — BP 145/92 | HR 82 | Ht 69.0 in | Wt 242.5 lb

## 2024-12-12 DIAGNOSIS — R519 Headache, unspecified: Secondary | ICD-10-CM | POA: Insufficient documentation

## 2024-12-12 NOTE — Progress Notes (Unsigned)
 "  Chief Complaint  Patient presents with   New Patient (Initial Visit)    Pt in room 14. Alone. Internal referral for headaches, abnormal MRI.      ASSESSMENT AND PLAN  Kaitlin Torres is a 44 y.o. female     DIAGNOSTIC DATA (LABS, IMAGING, TESTING) - I reviewed patient records, labs, notes, testing and imaging myself where available.   MEDICAL HISTORY:  Kaitlin Torres, seen in request by  [place],  [place] [address], Severa Rock HERO, FNP   History is obtained from the patient and review of electronic medical records. I personally reviewed pertinent available imaging films in PACS.   PMHx of  HTN HLD Obesity- was treated with Wegovy  Sept 2025 x3 month PCOS  CAD,s/p stent  Vape   Wegovey,  occasionally, neck, occpitla, pressure, no light noise, x lasting for all day, no naues,   [place],   Since Nov 2025, no headache,    PHYSICAL EXAM:   Vitals:   12/12/24 1306  BP: (!) 145/92  Pulse: 82  Weight: 242 lb 8 oz (110 kg)  Height: 5' 9 (1.753 m)   Not recorded     Body mass index is 35.81 kg/m.  PHYSICAL EXAMNIATION:  Gen: NAD, conversant, well nourised, well groomed                     Cardiovascular: Regular rate rhythm, no peripheral edema, warm, nontender. Eyes: Conjunctivae clear without exudates or hemorrhage Neck: Supple, no carotid bruits. Pulmonary: Clear to auscultation bilaterally   NEUROLOGICAL EXAM:  MENTAL STATUS: Speech/cognition: Awake, alert, oriented to history taking and casual conversation CRANIAL NERVES: CN II: Visual fields are full to confrontation. Pupils are round equal and briskly reactive to light. CN III, IV, VI: extraocular movement are normal. No ptosis. CN V: Facial sensation is intact to light touch CN VII: Face is symmetric with normal eye closure  CN VIII: Hearing is normal to causal conversation. CN IX, X: Phonation is normal. CN XI: Head turning and shoulder shrug are intact  MOTOR: There is no pronator drift of  out-stretched arms. Muscle bulk and tone are normal. Muscle strength is normal.  REFLEXES: Reflexes are 2+ and symmetric at the biceps, triceps, knees, and ankles. Plantar responses are flexor.  SENSORY: Intact to light touch, pinprick and vibratory sensation are intact in fingers and toes.  COORDINATION: There is no trunk or limb dysmetria noted.  GAIT/STANCE: Posture is normal. Gait is steady with normal steps, base, arm swing, and turning. Heel and toe walking are normal. Tandem gait is normal.  Romberg is absent.  REVIEW OF SYSTEMS:  Full 14 system review of systems performed and notable only for as above All other review of systems were negative.   ALLERGIES: Allergies[1]  HOME MEDICATIONS: Current Outpatient Medications  Medication Sig Dispense Refill   aspirin  EC 81 MG tablet Take 2 tablets (162 mg total) by mouth daily. Swallow whole. 180 tablet 2   aspirin -acetaminophen -caffeine  (EXCEDRIN MIGRAINE) 250-250-65 MG tablet Take 2 tablets by mouth every 6 (six) hours as needed for headache.     atorvastatin  (LIPITOR ) 80 MG tablet Take 1 tablet by mouth once daily 90 tablet 0   Blood Pressure Monitoring (COMFORT TOUCH BP CUFF/LARGE) MISC 1 Device by Does not apply route 3 (three) times daily. 1 each 0   labetalol  (NORMODYNE ) 300 MG tablet TAKE 1 TABLET BY MOUTH THREE TIMES DAILY 90 tablet 0   naproxen  sodium (ALEVE ) 220 MG tablet Take  220 mg by mouth daily as needed.     semaglutide -weight management (WEGOVY ) 0.25 MG/0.5ML SOAJ SQ injection Inject 0.25 mg into the skin once a week. 2 mL 0   No current facility-administered medications for this visit.    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  Fibrocystic change.  Usual duct  epithelial hyperplasia.   CORONARY STENT INTERVENTION N/A 05/08/2019   Procedure: CORONARY STENT INTERVENTION;  Surgeon: Jordan, Peter M, MD;  Location: [facility] INVASIVE CV LAB;  Service: Cardiovascular;  Laterality: N/A;   FRACTURE SURGERY  2005   right ankle   HEMORRHOID SURGERY     HEMORRHOID SURGERY  2015   INCISION AND DRAINAGE BREAST ABSCESS  09/2010   left breast    LEFT HEART CATH AND CORONARY ANGIOGRAPHY N/A 05/08/2019   Procedure: LEFT HEART CATH AND CORONARY ANGIOGRAPHY;  Surgeon: Jordan, Peter M, MD;  Location: [facility] INVASIVE CV LAB;  Service: Cardiovascular;  Laterality: N/A;    FAMILY HISTORY: Family History  Problem Relation Age of Onset   Hypertension Mother    Peripheral Artery Disease Mother    Heart disease Father        Cardiac arrest 2006   Diabetes Father    Hyperlipidemia Father    Stroke Father    Aneurysm Brother    Cervical cancer Maternal Grandmother    Diabetes Paternal Grandmother     SOCIAL HISTORY: Social History   Socioeconomic History   Marital status: Single    Spouse name: Not on file   Number of children: 3   Years of education: 14   Highest education level: Associate degree: academic program  Occupational History   Occupation: Waitress  Tobacco Use   Smoking status: Former    Current packs/day: 0.00    Average packs/day: 0.1 packs/day for 26.0 years (2.6 ttl pk-yrs)    Types: Cigarettes    Start date: 09/15/1993    Quit date: 09/16/2019    Years since quitting: 5.2   Smokeless tobacco: Never  Vaping Use   Vaping status: Every Day   Substances: Nicotine, Flavoring  Substance and Sexual Activity   Alcohol use: No    Comment: quit  2018   Drug use: No   Sexual activity: Yes    Birth control/protection: None  Other Topics Concern   Not on file  Social History Narrative   Lives at home with her mother, husband and three daughters.   Right-handed.   Approximately 20 cups caffeine  per day (4-5 glasses holding 32oz of tea).   Social Drivers of Health   Tobacco Use: Medium Risk (12/12/2024)   Patient History    Smoking Tobacco Use: Former    Smokeless Tobacco Use: Never    Passive Exposure: Not on file  Financial Resource Strain: Low Risk (05/22/2024)   Overall Financial Resource Strain (CARDIA)  Difficulty of Paying Living Expenses: Not very hard  Food Insecurity: No Food Insecurity (12/04/2024)   Epic    Worried About Programme Researcher, Broadcasting/film/video in the Last Year: Never true    Ran Out of Food in the Last Year: Never true  Transportation Needs: No Transportation Needs (12/04/2024)   Epic    Lack of Transportation (Medical): No    Lack of Transportation (Non-Medical): No  Physical Activity: Sufficiently Active (05/22/2024)   Exercise Vital Sign    Days of Exercise per Week: 4 days    Minutes of Exercise per Session: 60 min  Stress: No Stress Concern Present (05/22/2024)   [employer] of Occupational Health - Occupational Stress Questionnaire    Feeling of Stress: Not at all  Social Connections: Moderately Isolated (05/22/2024)   Social Connection and Isolation Panel    Frequency of Communication with Friends and Family: More than three times a week    Frequency of Social Gatherings with Friends and Family: More than three times a week    Attends Religious Services: Never    Database Administrator or Organizations: No    Attends Engineer, Structural: Not on file    Marital Status: Living with partner  Intimate Partner Violence: Not At Risk (12/04/2024)   Epic    Fear of Current or Ex-Partner: No    Emotionally Abused: No    Physically Abused: No    Sexually Abused: No  Depression (PHQ2-9): Low Risk  (12/04/2024)   Depression (PHQ2-9)    PHQ-2 Score: 0  Alcohol Screen: Low Risk (07/06/2023)   Alcohol Screen    Last Alcohol Screening Score (AUDIT): 0  Housing: High Risk (12/04/2024)   Epic    Unable to Pay for Housing in the Last Year: Yes    Number of Times Moved in the Last Year: 0    Homeless in the Last Year: No  Utilities: Not At Risk (12/04/2024)   Epic    Threatened with loss of utilities: No  Health Literacy: Not on file      Modena Callander, M.D. Ph.D.  [facility] [address] [place], [place] [address] Ph: [phone] Fax: [phone]  CC:  Severa Rock HERO, FNP [address] [place],  [place] [address]  Severa Rock HERO, FNP      [1]  Allergies Allergen Reactions   Diclofenac  Hives   Poison Ivy Extract Other (See Comments)    poison ivy extract   "

## 2024-12-17 ENCOUNTER — Telehealth: Admitting: *Deleted

## 2024-12-18 ENCOUNTER — Telehealth: Admitting: *Deleted

## 2025-01-07 ENCOUNTER — Ambulatory Visit: Admitting: Family Medicine

## 2025-01-08 ENCOUNTER — Other Ambulatory Visit
